# Patient Record
Sex: Female | Born: 1945 | Race: Black or African American | Hispanic: No | Marital: Married | Smoking: Never smoker
Health system: Southern US, Community
[De-identification: ages and names within clinical notes are randomized; demographics above are authoritative.]

## PROBLEM LIST (undated history)

## (undated) DIAGNOSIS — R413 Other amnesia: Secondary | ICD-10-CM

## (undated) DIAGNOSIS — E785 Hyperlipidemia, unspecified: Secondary | ICD-10-CM

## (undated) DIAGNOSIS — I10 Essential (primary) hypertension: Secondary | ICD-10-CM

## (undated) DIAGNOSIS — F411 Generalized anxiety disorder: Secondary | ICD-10-CM

## (undated) DIAGNOSIS — K219 Gastro-esophageal reflux disease without esophagitis: Secondary | ICD-10-CM

## (undated) DIAGNOSIS — Z91013 Allergy to seafood: Secondary | ICD-10-CM

## (undated) HISTORY — DX: Hyperlipidemia, unspecified: E78.5

## (undated) HISTORY — PX: TUBAL LIGATION: SHX77

## (undated) HISTORY — DX: Gastro-esophageal reflux disease without esophagitis: K21.9

## (undated) HISTORY — DX: Generalized anxiety disorder: F41.1

## (undated) HISTORY — DX: Essential (primary) hypertension: I10

## (undated) HISTORY — DX: Other amnesia: R41.3

## (undated) HISTORY — DX: Allergy to seafood: Z91.013

---

## 1998-06-29 ENCOUNTER — Inpatient Hospital Stay (HOSPITAL_COMMUNITY): Admission: EM | Admit: 1998-06-29 | Discharge: 1998-07-02 | Payer: Self-pay | Admitting: Emergency Medicine

## 1998-07-01 ENCOUNTER — Encounter: Payer: Self-pay | Admitting: Cardiology

## 1999-11-25 ENCOUNTER — Other Ambulatory Visit: Admission: RE | Admit: 1999-11-25 | Discharge: 1999-11-25 | Payer: Self-pay | Admitting: Obstetrics and Gynecology

## 2000-04-03 ENCOUNTER — Ambulatory Visit (HOSPITAL_COMMUNITY): Admission: RE | Admit: 2000-04-03 | Discharge: 2000-04-03 | Payer: Self-pay | Admitting: Gastroenterology

## 2001-07-24 ENCOUNTER — Other Ambulatory Visit: Admission: RE | Admit: 2001-07-24 | Discharge: 2001-07-24 | Payer: Self-pay | Admitting: Obstetrics and Gynecology

## 2001-12-10 ENCOUNTER — Encounter: Admission: RE | Admit: 2001-12-10 | Discharge: 2001-12-10 | Payer: Self-pay | Admitting: *Deleted

## 2004-01-25 ENCOUNTER — Other Ambulatory Visit: Admission: RE | Admit: 2004-01-25 | Discharge: 2004-01-25 | Payer: Self-pay | Admitting: Obstetrics and Gynecology

## 2005-04-13 ENCOUNTER — Other Ambulatory Visit: Admission: RE | Admit: 2005-04-13 | Discharge: 2005-04-13 | Payer: Self-pay | Admitting: Obstetrics and Gynecology

## 2005-05-24 ENCOUNTER — Ambulatory Visit (HOSPITAL_COMMUNITY): Admission: RE | Admit: 2005-05-24 | Discharge: 2005-05-24 | Payer: Self-pay | Admitting: Obstetrics and Gynecology

## 2006-01-15 ENCOUNTER — Emergency Department (HOSPITAL_COMMUNITY): Admission: EM | Admit: 2006-01-15 | Discharge: 2006-01-15 | Payer: Self-pay | Admitting: Emergency Medicine

## 2007-07-06 ENCOUNTER — Inpatient Hospital Stay (HOSPITAL_COMMUNITY): Admission: EM | Admit: 2007-07-06 | Discharge: 2007-07-07 | Payer: Self-pay | Admitting: Emergency Medicine

## 2007-07-06 ENCOUNTER — Encounter (INDEPENDENT_AMBULATORY_CARE_PROVIDER_SITE_OTHER): Payer: Self-pay | Admitting: Emergency Medicine

## 2008-01-15 ENCOUNTER — Encounter: Admission: RE | Admit: 2008-01-15 | Discharge: 2008-01-15 | Payer: Self-pay | Admitting: Gastroenterology

## 2008-02-17 ENCOUNTER — Emergency Department (HOSPITAL_COMMUNITY): Admission: EM | Admit: 2008-02-17 | Discharge: 2008-02-17 | Payer: Self-pay | Admitting: Emergency Medicine

## 2010-02-25 ENCOUNTER — Inpatient Hospital Stay (HOSPITAL_COMMUNITY): Admission: EM | Admit: 2010-02-25 | Discharge: 2010-02-27 | Payer: Self-pay | Admitting: Emergency Medicine

## 2010-02-26 ENCOUNTER — Encounter (INDEPENDENT_AMBULATORY_CARE_PROVIDER_SITE_OTHER): Payer: Self-pay | Admitting: Internal Medicine

## 2010-03-17 ENCOUNTER — Ambulatory Visit (HOSPITAL_COMMUNITY): Admission: RE | Admit: 2010-03-17 | Discharge: 2010-03-17 | Payer: Self-pay | Admitting: Gastroenterology

## 2010-11-28 ENCOUNTER — Encounter: Payer: Self-pay | Admitting: Gastroenterology

## 2010-12-13 ENCOUNTER — Other Ambulatory Visit: Payer: Self-pay | Admitting: Obstetrics and Gynecology

## 2010-12-13 DIAGNOSIS — R928 Other abnormal and inconclusive findings on diagnostic imaging of breast: Secondary | ICD-10-CM

## 2010-12-15 ENCOUNTER — Ambulatory Visit
Admission: RE | Admit: 2010-12-15 | Discharge: 2010-12-15 | Disposition: A | Discharge status: Home / Self Care | Payer: BC Managed Care – PPO | Source: Ambulatory Visit | Attending: Obstetrics and Gynecology | Admitting: Obstetrics and Gynecology

## 2010-12-15 ENCOUNTER — Ambulatory Visit
Admission: RE | Admit: 2010-12-15 | Discharge: 2010-12-15 | Disposition: A | Discharge status: Home / Self Care | Payer: Medicare Other | Source: Ambulatory Visit | Attending: Obstetrics and Gynecology | Admitting: Obstetrics and Gynecology

## 2010-12-15 DIAGNOSIS — R928 Other abnormal and inconclusive findings on diagnostic imaging of breast: Secondary | ICD-10-CM

## 2010-12-28 ENCOUNTER — Ambulatory Visit: Payer: Self-pay | Admitting: Internal Medicine

## 2011-01-11 ENCOUNTER — Ambulatory Visit: Payer: BC Managed Care – PPO | Admitting: Internal Medicine

## 2011-01-17 ENCOUNTER — Ambulatory Visit (INDEPENDENT_AMBULATORY_CARE_PROVIDER_SITE_OTHER): Payer: Medicare Other | Admitting: Internal Medicine

## 2011-01-17 ENCOUNTER — Encounter: Payer: Self-pay | Admitting: Internal Medicine

## 2011-01-17 DIAGNOSIS — E785 Hyperlipidemia, unspecified: Secondary | ICD-10-CM | POA: Insufficient documentation

## 2011-01-17 DIAGNOSIS — R413 Other amnesia: Secondary | ICD-10-CM | POA: Insufficient documentation

## 2011-01-17 DIAGNOSIS — F411 Generalized anxiety disorder: Secondary | ICD-10-CM | POA: Insufficient documentation

## 2011-01-17 DIAGNOSIS — N6019 Diffuse cystic mastopathy of unspecified breast: Secondary | ICD-10-CM | POA: Insufficient documentation

## 2011-01-17 DIAGNOSIS — K219 Gastro-esophageal reflux disease without esophagitis: Secondary | ICD-10-CM

## 2011-01-17 DIAGNOSIS — Z23 Encounter for immunization: Secondary | ICD-10-CM

## 2011-01-17 DIAGNOSIS — Z91013 Allergy to seafood: Secondary | ICD-10-CM | POA: Insufficient documentation

## 2011-01-17 DIAGNOSIS — G3184 Mild cognitive impairment, so stated: Secondary | ICD-10-CM | POA: Insufficient documentation

## 2011-01-17 DIAGNOSIS — I10 Essential (primary) hypertension: Secondary | ICD-10-CM | POA: Insufficient documentation

## 2011-01-17 DIAGNOSIS — F419 Anxiety disorder, unspecified: Secondary | ICD-10-CM | POA: Insufficient documentation

## 2011-01-17 DIAGNOSIS — Z2911 Encounter for prophylactic immunotherapy for respiratory syncytial virus (RSV): Secondary | ICD-10-CM

## 2011-01-24 LAB — URINE MICROSCOPIC-ADD ON

## 2011-01-24 LAB — CARDIAC PANEL(CRET KIN+CKTOT+MB+TROPI)
CK, MB: 0.9 ng/mL (ref 0.3–4.0)
CK, MB: 0.9 ng/mL (ref 0.3–4.0)
CK, MB: 1.2 ng/mL (ref 0.3–4.0)
Relative Index: INVALID (ref 0.0–2.5)
Relative Index: INVALID (ref 0.0–2.5)
Relative Index: INVALID (ref 0.0–2.5)
Total CK: 75 U/L (ref 7–177)
Total CK: 76 U/L (ref 7–177)
Total CK: 91 U/L (ref 7–177)
Troponin I: 0.01 ng/mL (ref 0.00–0.06)
Troponin I: 0.01 ng/mL (ref 0.00–0.06)
Troponin I: 0.01 ng/mL (ref 0.00–0.06)

## 2011-01-24 LAB — CBC
HCT: 30.1 % — ABNORMAL LOW (ref 36.0–46.0)
HCT: 35 % — ABNORMAL LOW (ref 36.0–46.0)
Hemoglobin: 11.5 g/dL — ABNORMAL LOW (ref 12.0–15.0)
Hemoglobin: 9.9 g/dL — ABNORMAL LOW (ref 12.0–15.0)
MCHC: 32.8 g/dL (ref 30.0–36.0)
MCHC: 32.9 g/dL (ref 30.0–36.0)
MCV: 87.6 fL (ref 78.0–100.0)
MCV: 88 fL (ref 78.0–100.0)
Platelets: 215 10*3/uL (ref 150–400)
Platelets: 235 10*3/uL (ref 150–400)
RBC: 3.42 MIL/uL — ABNORMAL LOW (ref 3.87–5.11)
RBC: 4 MIL/uL (ref 3.87–5.11)
RDW: 12.6 % (ref 11.5–15.5)
RDW: 12.9 % (ref 11.5–15.5)
WBC: 6.5 10*3/uL (ref 4.0–10.5)
WBC: 7.2 10*3/uL (ref 4.0–10.5)

## 2011-01-24 LAB — COMPREHENSIVE METABOLIC PANEL
ALT: 14 U/L (ref 0–35)
ALT: 14 U/L (ref 0–35)
AST: 18 U/L (ref 0–37)
AST: 21 U/L (ref 0–37)
Albumin: 3.2 g/dL — ABNORMAL LOW (ref 3.5–5.2)
Albumin: 3.6 g/dL (ref 3.5–5.2)
Alkaline Phosphatase: 102 U/L (ref 39–117)
Alkaline Phosphatase: 94 U/L (ref 39–117)
BUN: 10 mg/dL (ref 6–23)
BUN: 9 mg/dL (ref 6–23)
CO2: 24 mEq/L (ref 19–32)
CO2: 28 mEq/L (ref 19–32)
Calcium: 8.7 mg/dL (ref 8.4–10.5)
Calcium: 9.1 mg/dL (ref 8.4–10.5)
Chloride: 111 mEq/L (ref 96–112)
Chloride: 111 mEq/L (ref 96–112)
Creatinine, Ser: 0.71 mg/dL (ref 0.4–1.2)
Creatinine, Ser: 0.76 mg/dL (ref 0.4–1.2)
GFR calc Af Amer: >60 mL/min (ref >60)
GFR calc Af Amer: >60 mL/min (ref >60)
GFR calc non Af Amer: >60 mL/min (ref >60)
GFR calc non Af Amer: >60 mL/min (ref >60)
Glucose, Bld: 105 mg/dL — ABNORMAL HIGH (ref 70–99)
Glucose, Bld: 87 mg/dL (ref 70–99)
Potassium: 3.4 mEq/L — ABNORMAL LOW (ref 3.5–5.1)
Potassium: 3.5 mEq/L (ref 3.5–5.1)
Sodium: 141 mEq/L (ref 135–145)
Sodium: 142 mEq/L (ref 135–145)
Total Bilirubin: 0.3 mg/dL (ref 0.3–1.2)
Total Bilirubin: 0.4 mg/dL (ref 0.3–1.2)
Total Protein: 6 g/dL (ref 6.0–8.3)
Total Protein: 6.6 g/dL (ref 6.0–8.3)

## 2011-01-24 LAB — DIFFERENTIAL
Basophils Absolute: 0 10*3/uL (ref 0.0–0.1)
Basophils Absolute: 0 10*3/uL (ref 0.0–0.1)
Basophils Relative: 0 % (ref 0–1)
Basophils Relative: 0 % (ref 0–1)
Eosinophils Absolute: 0 10*3/uL (ref 0.0–0.7)
Eosinophils Absolute: 0.1 10*3/uL (ref 0.0–0.7)
Eosinophils Relative: 1 % (ref 0–5)
Eosinophils Relative: 1 % (ref 0–5)
Lymphocytes Relative: 19 % (ref 12–46)
Lymphocytes Relative: 30 % (ref 12–46)
Lymphs Abs: 1.3 10*3/uL (ref 0.7–4.0)
Lymphs Abs: 2 10*3/uL (ref 0.7–4.0)
Monocytes Absolute: 0.3 10*3/uL (ref 0.1–1.0)
Monocytes Absolute: 0.4 10*3/uL (ref 0.1–1.0)
Monocytes Relative: 5 % (ref 3–12)
Monocytes Relative: 6 % (ref 3–12)
Neutro Abs: 4 10*3/uL (ref 1.7–7.7)
Neutro Abs: 5.5 10*3/uL (ref 1.7–7.7)
Neutrophils Relative %: 62 % (ref 43–77)
Neutrophils Relative %: 76 % (ref 43–77)

## 2011-01-24 LAB — URINALYSIS, ROUTINE W REFLEX MICROSCOPIC
Bilirubin Urine: NEGATIVE
Glucose, UA: NEGATIVE mg/dL
Hgb urine dipstick: NEGATIVE
Ketones, ur: NEGATIVE mg/dL
Nitrite: NEGATIVE
Protein, ur: NEGATIVE mg/dL
Specific Gravity, Urine: 1.02 (ref 1.005–1.030)
Urobilinogen, UA: 1 mg/dL (ref 0.0–1.0)
pH: 6 (ref 5.0–8.0)

## 2011-01-24 LAB — POCT CARDIAC MARKERS
CKMB, poc: <1 ng/mL — ABNORMAL LOW (ref 1.0–8.0)
Myoglobin, poc: 63.1 ng/mL (ref 12–200)
Troponin i, poc: <0.05 ng/mL (ref 0.00–0.09)

## 2011-01-24 LAB — LIPASE, BLOOD: Lipase: 36 U/L (ref 11–59)

## 2011-01-24 NOTE — Assessment & Plan Note (Signed)
Summary: new,bcbs,mcr   Vital Signs:  Patient profile:   65 year old female Height:      67 inches Weight:      186 pounds BMI:     29.24 Temp:     98.4 degrees F oral Pulse rate:   76 / minute Pulse rhythm:   regular Resp:     16 per minute BP sitting:   120 / 80  (left arm) Cuff size:   regular  Vitals Entered By: Lanier Prude, Beverly Gust) (January 17, 2011 10:36 AM) CC: Est PCP Is Patient Diabetic? No   CC:  Est PCP.  History of Present Illness: New pt - well exam. C/o GERD x long time treated with Dexilant; cognitive impairment since 04/04/01 on Galantamine; anxiety, FMS with chronic pains relieved with meds  Preventive Screening-Counseling & Management  Alcohol-Tobacco     Smoking Status: never  Caffeine-Diet-Exercise     Does Patient Exercise: yes  Current Medications (verified): 1)  Dexilant 60 Mg Cpdr (Dexlansoprazole) .Marland Kitchen.. 1 By Mouth Once Daily 2)  Galantamine Hydrobromide 12 Mg Tabs (Galantamine Hydrobromide) .Marland Kitchen.. 1 By Mouth Two Times A Day 3)  Alprazolam 0.5 Mg Tabs (Alprazolam) .Marland Kitchen.. 1 Po Once Daily As Needed 4)  Tramadol Hcl 50 Mg Tabs (Tramadol Hcl) .Marland Kitchen.. 1 By Mouth Once Daily As Needed  Allergies (verified): 1)  * Shellfish  Past History:  Past Medical History: GERD Dr [address] she had EGD and Colon Gyn Dr Arelia Sneddon FMS Anxiety Hyperlipidemia Hypertension Memory loss after her son died in Apr 04, 2001  Past Surgical History: Denies surgical history  Family History: Family History Breast cancer 1st degree relative <50 Family History of CAD Female 1st degree relative <50 Family History of Cervical cancer Family History Ovarian cancer 7 sisters and 6 brothers  Social History: Occupation: she was a Technical sales engineer Retired Married Never Smoked Alcohol use-no Regular exercise-yes Smoking Status:  never Does Patient Exercise:  yes  Review of Systems       The patient complains of depression.  The patient denies anorexia, fever, weight loss, weight gain,  vision loss, decreased hearing, hoarseness, chest pain, syncope, dyspnea on exertion, peripheral edema, prolonged cough, headaches, hemoptysis, abdominal pain, melena, hematochezia, severe indigestion/heartburn, hematuria, incontinence, genital sores, muscle weakness, suspicious skin lesions, transient blindness, difficulty walking, unusual weight change, abnormal bleeding, enlarged lymph nodes, angioedema, and breast masses.    Physical Exam  General:  Well-developed,well-nourished,in no acute distress; alert,appropriate and cooperative throughout examination Head:  Normocephalic and atraumatic without obvious abnormalities. No apparent alopecia or balding. Eyes:  No corneal or conjunctival inflammation noted. EOMI. Perrla.  Ears:  External ear exam shows no significant lesions or deformities.  Otoscopic examination reveals clear canals, tympanic membranes are intact bilaterally without bulging, retraction, inflammation or discharge. Hearing is grossly normal bilaterally. Nose:  External nasal examination shows no deformity or inflammation. Nasal mucosa are pink and moist without lesions or exudates. Mouth:  Oral mucosa and oropharynx without lesions or exudates.  Teeth in good repair. Lungs:  Normal respiratory effort, chest expands symmetrically. Lungs are clear to auscultation, no crackles or wheezes. Heart:  Normal rate and regular rhythm. S1 and S2 normal without gallop, murmur, click, rub or other extra sounds. Abdomen:  Bowel sounds positive,abdomen soft and non-tender without masses, organomegaly or hernias noted. Msk:  No deformity or scoliosis noted of thoracic or lumbar spine.   Extremities:  No clubbing, cyanosis, edema, or deformity noted with normal full range of motion of all joints.  Neurologic:  No cranial nerve deficits noted. Station and gait are normal. Plantar reflexes are down-going bilaterally. DTRs are symmetrical throughout. Sensory, motor and coordinative functions appear  intact. Skin:  Intact without suspicious lesions or rashes Cervical Nodes:  No lymphadenopathy noted Psych:  Cognition and judgment appear intact. Alert and cooperative with normal attention span and concentration. No apparent delusions, illusions, hallucinations   Impression & Recommendations:  Problem # 1:  HEALTH MAINTENANCE EXAM (ICD-V70.0) Assessment New Health and age related issues were discussed. Available screening tests and vaccinations were discussed as well. Healthy life style including good diet and exercise was discussed.  Labs ordered  Problem # 2:  FIBROCYSTIC BREAST DISEASE (ICD-610.1) Assessment: Unchanged  Problem # 3:  ANXIETY (ICD-300.00) Assessment: Unchanged  Her updated medication list for this problem includes:    Alprazolam 0.5 Mg Tabs (Alprazolam) .Marland Kitchen... 1 po once daily as needed  Problem # 4:  GERD (ICD-530.81) Assessment: Unchanged  Her updated medication list for this problem includes:    Dexilant 60 Mg Cpdr (Dexlansoprazole) .Marland Kitchen... 1 by mouth once daily  Problem # 5:  HYPERTENSION (ICD-401.9)  Complete Medication List: 1)  Dexilant 60 Mg Cpdr (Dexlansoprazole) .Marland Kitchen.. 1 by mouth once daily 2)  Galantamine Hydrobromide 12 Mg Tabs (Galantamine hydrobromide) .Marland Kitchen.. 1 by mouth two times a day 3)  Alprazolam 0.5 Mg Tabs (Alprazolam) .Marland Kitchen.. 1 po once daily as needed 4)  Tramadol Hcl 50 Mg Tabs (Tramadol hcl) .Marland Kitchen.. 1 by mouth once daily as needed 5)  Ibuprofen 400 Mg Tabs (Ibuprofen) .Marland Kitchen.. 1 by mouth two times a day pc prn 6)  Vitamin D 1000 Unit Tabs (Cholecalciferol) .Marland Kitchen.. 1 by mouth qd 7)  Sudafed 30 Mg Tabs (Pseudoephedrine hcl) .... 2 by mouth qid prn 8)  Benadryl 25 Mg Tabs (Diphenhydramine hcl) .... 2 by mouth qid as needed reaction  Other Orders: Zoster (Shingles) Vaccine Live [phone]) Admin 1st Vaccine (60454)  Patient Instructions: 1)  Please schedule a follow-up appointment in 3 months. 2)  BMP prior to visit, ICD-9: v70.0 3)  Hepatic Panel prior  to visit, ICD-9: 4)  Lipid Panel prior to visit, ICD-9: 5)  TSH prior to visit, ICD-9: 6)  CBC w/ Diff prior to visit, ICD-9: 7)  Urine-dip prior to visit, ICD-9: 8)  Vit B12 782.0 9)  Vit D 733.90 Prescriptions: TRAMADOL HCL 50 MG TABS (TRAMADOL HCL) 1 by mouth once daily as needed  #90 x 3   Entered and Authorized by:   Tresa Garter MD   Signed by:   Tresa Garter MD on 01/17/2011   Method used:   Print then Give to Patient   RxID:   [id number] ALPRAZOLAM 0.5 MG TABS (ALPRAZOLAM) 1 po once daily as needed  #90 x 2   Entered and Authorized by:   Tresa Garter MD   Signed by:   Tresa Garter MD on 01/17/2011   Method used:   Print then Give to Patient   RxID:   [id number] GALANTAMINE HYDROBROMIDE 12 MG TABS (GALANTAMINE HYDROBROMIDE) 1 by mouth two times a day  #180 x 3   Entered and Authorized by:   Tresa Garter MD   Signed by:   Tresa Garter MD on 01/17/2011   Method used:   Print then Give to Patient   RxID:   [id number] DEXILANT 60 MG CPDR (DEXLANSOPRAZOLE) 1 by mouth once daily  #90 x 3   Entered and Authorized by:   Georgina Quint Gianlucca Szymborski  MD   Signed by:   Tresa Garter MD on 01/17/2011   Method used:   Print then Give to Patient   RxID:   [id number] BENADRYL 25 MG TABS (DIPHENHYDRAMINE HCL) 2 by mouth qid as needed reaction  #60 x 3   Entered and Authorized by:   Tresa Garter MD   Signed by:   Tresa Garter MD on 01/17/2011   Method used:   Print then Give to Patient   RxID:   [id number] SUDAFED 30 MG TABS (PSEUDOEPHEDRINE HCL) 2 by mouth qid prn  #60 x 3   Entered and Authorized by:   Tresa Garter MD   Signed by:   Tresa Garter MD on 01/17/2011   Method used:   Print then Give to Patient   RxID:   [id number] IBUPROFEN 400 MG TABS (IBUPROFEN) 1 by mouth two times a day pc prn  #60 x 3   Entered and Authorized by:   Tresa Garter MD   Signed by:   Tresa Garter MD on 01/17/2011   Method used:   Print then Give to Patient   RxID:   [id number]    Orders Added: 1)  Zoster (Shingles) Vaccine Live [[id number]] 2)  Admin 1st Vaccine [90471] 3)  New Patient 65&> [63875]   Immunizations Administered:  Zostavax # 1:    Vaccine Type: Zostavax    Site: left deltoid    Mfr: Merck    Dose: 0.65 ml    Route: [place]    Given by: Lanier Prude, CMA(AAMA)    Exp. Date: 06/24/2011    Lot #: [id number]    VIS given: 08/18/05 given January 17, 2011.   Immunizations Administered:  Zostavax # 1:    Vaccine Type: Zostavax    Site: left deltoid    Mfr: Merck    Dose: 0.65 ml    Route: [place]    Given by: Lanier Prude, CMA(AAMA)    Exp. Date: 06/24/2011    Lot #: [id number]    VIS given: 08/18/05 given January 17, 2011.

## 2011-03-21 NOTE — H&P (Signed)
NAME:  Shelley Bray, Shelley Bray                ACCOUNT NO.:  [id number]   MEDICAL RECORD NO.:  [id number]          PATIENT TYPE:  INP   LOCATION:  1826                         FACILITY:  [facility]   PHYSICIAN:  Hind I Elsaid, MD      DATE OF BIRTH:  1946/06/19   DATE OF ADMISSION:  07/06/2007  DATE OF DISCHARGE:                              HISTORY & PHYSICAL   PRIMARY CARE PHYSICIAN:  Dr. Mignon Pine.   HISTORY OF PRESENT ILLNESS:  A 65 year old female with a history of  fibromyalgia, hypercholesterolemia, recently diagnosed with  hypertension, where the patient placed on Cardura and Zocor for one  month.  The patient admitted to the hospital with a chief complaint of  one-day history of abdominal pain, which mainly around umbilicus, which  was a sudden onset and gradually built up,  severity was 10/10.  Pain  was not radiated.  Pain was constant.  Pain continued all day long.  The  patient tried to take one of her fibromyalgia medications to relieve her  abdominal pain, but there is no significant resolution of her abdominal  pain.  The patient continued to do her daily activities despite the  abdominal pain.  The patient denies any change of bowel habits.  The  patient admitted she did not eat anything that day.  She has to go to  some family meeting, and she was there she did not even finish 30  minutes from her visit.  As she was sitting, she suddenly became  sweating, and also she was pale, and then according to her history, she  passed out for one minute recognized by the family, and they had to call  the ambulance to evaluate her.  After that, the patient continued to  complain of left sided chest pain, radiated to her left arm.  Pain was  tightness in nature, but radiated to both shoulders associated with some  shortness of breath.  The patient denies any relieving or aggravating  factor.  Is not related to deep breathing.  The patient was seen in the  ED, still complaining of left sided chest  pain she rated around 5/10.  .  The patient denies any burning micturition.  Denies any change in bowel  habit.  The patient admitted she has a result of nausea after the  syncopal episode, and dry heaves.  Denies any vomiting.  The patient  denies any diarrhea.  Denies any witnessed seizure.  Denies any  headache.  Denies any numbness or weakness of her extremities.  Denies  any shortness of breath at this time.  Denies any back pain.   PAST MEDICAL HISTORY:  1. Anxiety disorder.  2. Fibromyalgia.  3. Hypercholesterolemia.  4. Hypertension.  5. Mild cognitive impairment.   MEDICATIONS:  Reminil dose unknown,  Mobic, Xanax, Cardura current dose  unknown.   ALLERGIES:  No known drug allergies.   PAST SURGICAL HISTORY:  No significant past surgical history.   FAMILY HISTORY:  Mother died at age more than 100.  According to her,  she has many sisters and brothers, most of  them with cancer at age 37 to  88, include pancreatic cancer, breast cancer, and brain cancer.   SOCIAL HISTORY:  She lives with her husband, has two children.  One of  them, a boy who died at age 33 after developing renal insufficiency.  Daughter is still alive, and she is married and in good health.  The  patient denies any smoking history.  Occasional alcohol abuse, and  denies any IV drug use.   PHYSICAL EXAMINATION:  VITAL SIGNS:  Temperature was 97.4, blood  pressure 123/82, pulse rate 69, and saturating 98% on room air.  GENERAL:  The patient lying comfortably in bed, not in respiratory  distress or shortness of breath.  During conversation, the patient had  some abdominal pain, mainly around umbilicus, and some chest tightness.  HEENT:  Normocephalic, atraumatic.  No jaundice.  Pupils equal, reactive  to light and accommodation.  NECK:  Neck supple.  No JVD, and I could not appreciate any carotid  bruit.  LUNGS:  Normal work of breathing with equal air entry.  HEART:  S1 and S2; no other sounds.   ABDOMEN:  Soft.  Mild tenderness around the right area fossa.  Bowel  sounds positive.  I did not appreciate any organomegaly.  LOWER EXTREMITIES:  There is no evidence of edema.  Peripheral pulses  intact, and no cyanosis.  CNS:  Patient alert and oriented x3 with no focal neurological findings.   LABORATORY DATA:  Sodium 141, potassium 3.8, chloride 108, BUN 16,  glucose 83, bicarb 25.7.  White blood cells 8.2, hemoglobin 11.3,  hematocrit 33.2, and platelets 246. EKG:  Normal sinus rhythm.  Cardiac  enzymes were negative, and D-dimer was 0.23.   ASSESSMENT AND PLAN:  1. Syncope:  This looks vasovagal syncope versus neurocardiogenic      secondary to the severity of abdominal pain.  We will admit the      patient for telemetry to evaluate for any cardiac evidence.  We      will get cardiac enzymes and 2-D echocardiogram, and we will start      the patient on aspirin.  Further recommendations to be addressed      during hospitalization.  Unlikely the patient has any evidence of      seizure or stroke, and I do not think no need for any MRI or EEG at      this time.  2. Chest pain:  Rule out myocardial infarction.  The patient still has      chest pain.  We will admit the patient, get lipid profile and      cardiac enzymes, and further recommendations for severity.  We will      consider cardiology consult if the patient continues complaining of      chest pain.  3. Abdominal pain:  Cause is really unclear.  The patient has normal      abdominal examination, and bowel sounds were positive.  We will go      ahead with CT abdomen and pelvis with p.o. and IV contrast.  We      will get amylase level, and we will get liver function test, and we      will continue with pain medication at this time.  Further      recommendations to be addressed after above report.  Deep venous      thrombosis and gastrointestinal prophylaxis.      Hind I Eda Paschal, MD  Electronically Signed  HIE/MEDQ  D:  07/06/2007  T:  07/07/2007  Job:  [id number]

## 2011-03-24 NOTE — Op Note (Signed)
[place] [place]  Patient:    Shelley Bray, Shelley Bray                       MRN: [id number] Proc. Date: 04/03/00 Adm. Date:  [id number] Attending:  Orland Mustard CC:         Juluis Mire, M.D.                           Operative Report  PROCEDURE:  Colonoscopy.  MEDICATIONS:  Fentanyl 75 mcg, Versed 10 mg IV.  SCOPE:  Olympus adult video colonoscope.  INDICATIONS:  Rectal bleeding in a 65 year old.  DESCRIPTION OF PROCEDURE:  The procedure had been explained to the patient and consent obtained.  With the patient in the left lateral decubitus position, the  Olympus adult video colonoscope was inserted and advanced under direct visualization.  Prep quite good.  We were able to advance to the cecum without difficulty.  The patient had melanosis coli throughout.  The ileocecal valve and right lower quadrant were seen.  The scope was withdrawn and the cecum, ascending colon, hepatic flexure, transverse colon, splenic flexure, descending and sigmoid colon were seen well upon removal and no polyps seen.  Marked diverticulosis seen in the left colon.  We were able to pass this area fairly easily.  Internal hemorrhoids were seen in the rectum and presumably the source of the patients bleeding.  The scope was withdrawn and the patient tolerated the procedure well. She was maintained on low flow oxygen and pulse oximetry throughout the procedure with no obvious problems.  ASSESSMENT: 1. Rectal bleeding probably due to internal hemorrhoids. 2. Diverticulosis.  PLAN:  Will recommend yearly hemoccults and a sigmoidoscopy in five years.  If er hemoccults are positive, I would go ahead with another colonoscopy. DD:  04/03/00 TD:  04/04/00 Job: [id number]

## 2011-03-24 NOTE — Discharge Summary (Signed)
NAMEANAEL, ROSCH                ACCOUNT NO.:  [id number]   MEDICAL RECORD NO.:  [id number]          PATIENT TYPE:  INP   LOCATION:  4713                         FACILITY:  [facility]   PHYSICIAN:  Lonia Blood, M.D.       DATE OF BIRTH:  [birth year]   DATE OF ADMISSION:  07/05/2007  DATE OF DISCHARGE:  07/07/2007                               DISCHARGE SUMMARY   PRIMARY CARE PHYSICIAN:  Olene Craven, M.D.   DISCHARGE DIAGNOSES:  1. Syncope, probable vasovagal reaction, no recurrence.  2. Abdominal pain of unclear etiology.  CT scan of abdomen within      normal limits.  3. Chest pain, unclear etiology.  Three sets of cardiac enzymes were      within normal limits.  4. Hypertension.  5. Hyperlipidemia.  6. Urinary tract infection.  7. Mild anemia.  8. Fibromyalgia.  9. Anxiety disorder.   DISCHARGE MEDICATIONS:  1. Reminyl as previously.  2. Xanax 1 mg three times a day as needed.  3. Zocor 40 mg daily.  4. Cipro 500 mg twice a day for 3 days.  5. Prilosec OTC 20 mg daily.   CONDITION ON DISCHARGE:  The patient was discharged in good condition to  home and she was told to follow up with her primary care physician as  needed.   PROCEDURES PERFORMED:  On July 06, 2007, the patient underwent CT scan  of abdomen and pelvis.  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  No acute intra-abdominal or pelvic  pathology.   CONSULTATION:  No consultations were obtained.   HISTORY AND PHYSICAL:  For admission history and physical, refer to the  dictated H and P done by Dr. Eda Paschal on July 06, 2007.   HOSPITAL COURSE:  1. Abdominal pain.  Ms. Osgood was admitted and she was fully evaluated      with CT scan of abdomen and pelvis.  Findings of the CT scan were      within normal limits.  The patient's abdominal pain has improved      significantly and by July 07, 2007, she was without any pain.  It      was felt that the patient has had the  episode of syncope most      likely related to the abdominal pain and it was probably a      vasovagal event.  The patient did not have any alarms on telemetry      and 3 sets of cardiac enzymes were within normal limits.  The      patient's orthostatics were      within normal limits also.  2. Urinary tract infection.  The patient's culture was positive for      multiple pathogens.  We have treated her empirically with      ciprofloxacin with good response.      Lonia Blood, M.D.  Electronically Signed     SL/MEDQ  D:  08/01/2007  T:  08/02/2007  Job:  [id number]   cc:   Olene Craven, M.D.

## 2011-04-18 ENCOUNTER — Other Ambulatory Visit: Payer: Self-pay | Admitting: Internal Medicine

## 2011-04-18 ENCOUNTER — Other Ambulatory Visit: Payer: BC Managed Care – PPO

## 2011-04-18 DIAGNOSIS — M899 Disorder of bone, unspecified: Secondary | ICD-10-CM

## 2011-04-18 DIAGNOSIS — R209 Unspecified disturbances of skin sensation: Secondary | ICD-10-CM

## 2011-04-18 DIAGNOSIS — Z0000 Encounter for general adult medical examination without abnormal findings: Secondary | ICD-10-CM

## 2011-04-21 ENCOUNTER — Encounter: Payer: Self-pay | Admitting: Internal Medicine

## 2011-04-25 ENCOUNTER — Ambulatory Visit (INDEPENDENT_AMBULATORY_CARE_PROVIDER_SITE_OTHER): Payer: Medicare Other | Admitting: Internal Medicine

## 2011-04-25 ENCOUNTER — Other Ambulatory Visit (INDEPENDENT_AMBULATORY_CARE_PROVIDER_SITE_OTHER): Payer: Medicare Other

## 2011-04-25 ENCOUNTER — Other Ambulatory Visit: Payer: Self-pay | Admitting: Internal Medicine

## 2011-04-25 ENCOUNTER — Encounter: Payer: Self-pay | Admitting: Internal Medicine

## 2011-04-25 DIAGNOSIS — F988 Other specified behavioral and emotional disorders with onset usually occurring in childhood and adolescence: Secondary | ICD-10-CM | POA: Insufficient documentation

## 2011-04-25 DIAGNOSIS — R5383 Other fatigue: Secondary | ICD-10-CM

## 2011-04-25 DIAGNOSIS — G3184 Mild cognitive impairment, so stated: Secondary | ICD-10-CM

## 2011-04-25 DIAGNOSIS — R209 Unspecified disturbances of skin sensation: Secondary | ICD-10-CM

## 2011-04-25 DIAGNOSIS — Z0000 Encounter for general adult medical examination without abnormal findings: Secondary | ICD-10-CM

## 2011-04-25 DIAGNOSIS — I10 Essential (primary) hypertension: Secondary | ICD-10-CM

## 2011-04-25 DIAGNOSIS — R5381 Other malaise: Secondary | ICD-10-CM

## 2011-04-25 DIAGNOSIS — R5382 Chronic fatigue, unspecified: Secondary | ICD-10-CM | POA: Insufficient documentation

## 2011-04-25 DIAGNOSIS — M949 Disorder of cartilage, unspecified: Secondary | ICD-10-CM

## 2011-04-25 DIAGNOSIS — M899 Disorder of bone, unspecified: Secondary | ICD-10-CM

## 2011-04-25 LAB — URINALYSIS
Bilirubin Urine: NEGATIVE
Hgb urine dipstick: NEGATIVE
Ketones, ur: NEGATIVE
Leukocytes, UA: NEGATIVE
Nitrite: NEGATIVE
Specific Gravity, Urine: 1.025 (ref 1.000–1.030)
Total Protein, Urine: NEGATIVE
Urine Glucose: NEGATIVE
Urobilinogen, UA: 0.2 (ref 0.0–1.0)
pH: 5.5 (ref 5.0–8.0)

## 2011-04-25 LAB — BASIC METABOLIC PANEL
BUN: 12 mg/dL (ref 6–23)
CO2: 22 mEq/L (ref 19–32)
Calcium: 9.3 mg/dL (ref 8.4–10.5)
Chloride: 111 mEq/L (ref 96–112)
Creatinine, Ser: 0.6 mg/dL (ref 0.4–1.2)
GFR: 138.99 mL/min (ref >60.00)
Glucose, Bld: 91 mg/dL (ref 70–99)
Potassium: 4.8 mEq/L (ref 3.5–5.1)
Sodium: 143 mEq/L (ref 135–145)

## 2011-04-25 LAB — HEPATIC FUNCTION PANEL
ALT: 14 U/L (ref 0–35)
AST: 33 U/L (ref 0–37)
Albumin: 4 g/dL (ref 3.5–5.2)
Alkaline Phosphatase: 114 U/L (ref 39–117)
Bilirubin, Direct: 0.5 mg/dL — ABNORMAL HIGH (ref 0.0–0.3)
Total Bilirubin: 1.5 mg/dL — ABNORMAL HIGH (ref 0.3–1.2)
Total Protein: 6.9 g/dL (ref 6.0–8.3)

## 2011-04-25 LAB — CBC WITH DIFFERENTIAL/PLATELET
Basophils Absolute: 0 10*3/uL (ref 0.0–0.1)
Basophils Relative: 0.4 % (ref 0.0–3.0)
Eosinophils Absolute: 0 10*3/uL (ref 0.0–0.7)
Eosinophils Relative: 0.7 % (ref 0.0–5.0)
HCT: 34.4 % — ABNORMAL LOW (ref 36.0–46.0)
Hemoglobin: 11.6 g/dL — ABNORMAL LOW (ref 12.0–15.0)
Lymphocytes Relative: 27.1 % (ref 12.0–46.0)
Lymphs Abs: 1.6 10*3/uL (ref 0.7–4.0)
MCHC: 33.7 g/dL (ref 30.0–36.0)
MCV: 87.1 fl (ref 78.0–100.0)
Monocytes Absolute: 0.3 10*3/uL (ref 0.1–1.0)
Monocytes Relative: 4.4 % (ref 3.0–12.0)
Neutro Abs: 4 10*3/uL (ref 1.4–7.7)
Neutrophils Relative %: 67.4 % (ref 43.0–77.0)
Platelets: 234 10*3/uL (ref 150.0–400.0)
RBC: 3.96 Mil/uL (ref 3.87–5.11)
RDW: 13.5 % (ref 11.5–14.6)
WBC: 5.9 10*3/uL (ref 4.5–10.5)

## 2011-04-25 LAB — LIPID PANEL
Cholesterol: 237 mg/dL — ABNORMAL HIGH (ref 0–200)
HDL: 65.9 mg/dL (ref >39.00)
Total CHOL/HDL Ratio: 4
Triglycerides: 57 mg/dL (ref 0.0–149.0)
VLDL: 11.4 mg/dL (ref 0.0–40.0)

## 2011-04-25 LAB — TSH: TSH: 1.31 u[IU]/mL (ref 0.35–5.50)

## 2011-04-25 LAB — LDL CHOLESTEROL, DIRECT: Direct LDL: 151.7 mg/dL

## 2011-04-25 LAB — VITAMIN B12: Vitamin B-12: 602 pg/mL (ref 211–911)

## 2011-04-25 MED ORDER — METHYLPHENIDATE HCL 5 MG PO TABS: ORAL_TABLET | ORAL | Status: DC

## 2011-04-25 NOTE — Assessment & Plan Note (Signed)
Ritalin should help

## 2011-04-25 NOTE — Assessment & Plan Note (Signed)
Start Ritalin w/caution  Potential benefits of a long term amphetamine  use as well as potential risks  and complications were explained to the patient and were aknowledged.

## 2011-04-25 NOTE — Assessment & Plan Note (Signed)
Will try Ritalin

## 2011-04-25 NOTE — Progress Notes (Signed)
  Subjective:    Patient ID: Shelley Bray, female    DOB: Nov 09, 1945, 65 y.o.   MRN: [id number]  HPI   The patient is here to follow up on chronic cognitive impairment, depression, anxiety, headaches and chronic moderate fibromyalgia symptoms controlled with medicines, diet and exercise. Wt Readings from Last 3 Encounters:  04/25/11 197 lb (89.359 kg)  01/17/11 186 lb (84.369 kg)     Review of Systems  Constitutional: Positive for fatigue and unexpected weight change (wt gain). Negative for chills, activity change and appetite change.  HENT: Negative for congestion, mouth sores and sinus pressure.   Eyes: Negative for visual disturbance.  Respiratory: Negative for cough and chest tightness.   Gastrointestinal: Negative for nausea and abdominal pain.  Genitourinary: Negative for frequency, difficulty urinating and vaginal pain.  Musculoskeletal: Negative for back pain and gait problem.  Skin: Negative for pallor and rash.  Neurological: Negative for dizziness, tremors, weakness, numbness and headaches.  Psychiatric/Behavioral: Negative for suicidal ideas, confusion and sleep disturbance.       Forgetful ADD sx's Stressed       Objective:   Physical Exam  Constitutional: She appears well-developed and well-nourished. No distress.  HENT:  Head: Normocephalic.  Right Ear: External ear normal.  Left Ear: External ear normal.  Nose: Nose normal.  Mouth/Throat: Oropharynx is clear and moist.  Eyes: Conjunctivae are normal. Pupils are equal, round, and reactive to light. Right eye exhibits no discharge. Left eye exhibits no discharge.  Neck: Normal range of motion. Neck supple. No JVD present. No tracheal deviation present. No thyromegaly present.  Cardiovascular: Normal rate, regular rhythm and normal heart sounds.   Pulmonary/Chest: No stridor. No respiratory distress. She has no wheezes.  Abdominal: Soft. Bowel sounds are normal. She exhibits no distension and no mass. There is  no tenderness. There is no rebound and no guarding.  Musculoskeletal: She exhibits no edema and no tenderness.  Lymphadenopathy:    She has no cervical adenopathy.  Neurological: She displays normal reflexes. No cranial nerve deficit. She exhibits normal muscle tone. Coordination normal.  Skin: No rash noted. No erythema.  Psychiatric: She has a normal mood and affect. Her behavior is normal. Judgment and thought content normal.        Lab Results  Component Value Date   WBC 6.5 02/26/2010   HGB 9.9* 02/26/2010   HCT 30.1* 02/26/2010   PLT 215 02/26/2010   ALT 14 02/26/2010   AST 18 02/26/2010   NA 142 02/26/2010   K 3.5 02/26/2010   CL 111 02/26/2010   CREATININE 0.71 02/26/2010   BUN 10 02/26/2010   CO2 28 02/26/2010     Assessment & Plan:

## 2011-04-26 LAB — VITAMIN D 25 HYDROXY (VIT D DEFICIENCY, FRACTURES): Vit D, 25-Hydroxy: 36 ng/mL (ref 30–89)

## 2011-06-04 ENCOUNTER — Other Ambulatory Visit: Payer: Self-pay | Admitting: Internal Medicine

## 2011-07-11 ENCOUNTER — Encounter: Payer: Self-pay | Admitting: Internal Medicine

## 2011-07-11 ENCOUNTER — Ambulatory Visit (INDEPENDENT_AMBULATORY_CARE_PROVIDER_SITE_OTHER): Payer: Medicare Other | Admitting: Internal Medicine

## 2011-07-11 DIAGNOSIS — G3184 Mild cognitive impairment, so stated: Secondary | ICD-10-CM

## 2011-07-11 DIAGNOSIS — F988 Other specified behavioral and emotional disorders with onset usually occurring in childhood and adolescence: Secondary | ICD-10-CM

## 2011-07-11 DIAGNOSIS — R5383 Other fatigue: Secondary | ICD-10-CM

## 2011-07-11 DIAGNOSIS — E785 Hyperlipidemia, unspecified: Secondary | ICD-10-CM

## 2011-07-11 DIAGNOSIS — R5381 Other malaise: Secondary | ICD-10-CM

## 2011-07-11 DIAGNOSIS — K219 Gastro-esophageal reflux disease without esophagitis: Secondary | ICD-10-CM

## 2011-07-11 DIAGNOSIS — R5382 Chronic fatigue, unspecified: Secondary | ICD-10-CM

## 2011-07-11 DIAGNOSIS — I10 Essential (primary) hypertension: Secondary | ICD-10-CM

## 2011-07-11 MED ORDER — METHYLPHENIDATE HCL ER (LA) 10 MG PO CP24: 10.0000 mg | ORAL_CAPSULE | Freq: Every day | ORAL | Status: DC

## 2011-07-11 NOTE — Assessment & Plan Note (Signed)
Some better with Ritalin Will try an [place] form

## 2011-07-11 NOTE — Assessment & Plan Note (Signed)
Continue with current prescription therapy as reflected on the Med list.  

## 2011-07-11 NOTE — Assessment & Plan Note (Signed)
Statin intolerant 

## 2011-07-11 NOTE — Assessment & Plan Note (Signed)
Some better with Ritalin

## 2011-07-11 NOTE — Progress Notes (Signed)
  Subjective:    Patient ID: Shelley Bray, female    DOB: 08-23-1946, 65 y.o.   MRN: [id number]  HPI   The patient is here to follow up on chronic memory and focus issues, depression, anxiety, headaches and chronic grief reaction   Review of Systems  Constitutional: Negative for chills, activity change, appetite change, fatigue and unexpected weight change.  HENT: Negative for congestion, mouth sores and sinus pressure.   Eyes: Negative for visual disturbance.  Respiratory: Negative for cough and chest tightness.   Gastrointestinal: Negative for nausea and abdominal pain.  Genitourinary: Negative for frequency, difficulty urinating and vaginal pain.  Musculoskeletal: Negative for back pain and gait problem.  Skin: Negative for pallor and rash.  Neurological: Negative for dizziness, tremors, weakness, numbness and headaches.  Psychiatric/Behavioral: Positive for suicidal ideas. Negative for confusion and sleep disturbance. The patient is nervous/anxious.    Wt Readings from Last 3 Encounters:  07/11/11 196 lb (88.905 kg)  04/25/11 197 lb (89.359 kg)  01/17/11 186 lb (84.369 kg)       Objective:   Physical Exam  Constitutional: She appears well-developed and well-nourished. No distress.  HENT:  Head: Normocephalic.  Right Ear: External ear normal.  Left Ear: External ear normal.  Nose: Nose normal.  Mouth/Throat: Oropharynx is clear and moist.  Eyes: Conjunctivae are normal. Pupils are equal, round, and reactive to light. Right eye exhibits no discharge. Left eye exhibits no discharge.  Neck: Normal range of motion. Neck supple. No JVD present. No tracheal deviation present. No thyromegaly present.  Cardiovascular: Normal rate, regular rhythm and normal heart sounds.   Pulmonary/Chest: No stridor. No respiratory distress. She has no wheezes.  Abdominal: Soft. Bowel sounds are normal. She exhibits no distension and no mass. There is no tenderness. There is no rebound and no  guarding.  Musculoskeletal: She exhibits no edema and no tenderness.  Lymphadenopathy:    She has no cervical adenopathy.  Neurological: She displays normal reflexes. No cranial nerve deficit. She exhibits normal muscle tone. Coordination normal.  Skin: No rash noted. No erythema.  Psychiatric: She has a normal mood and affect. Her behavior is normal. Judgment and thought content normal.      Lab Results  Component Value Date   WBC 5.9 04/25/2011   HGB 11.6* 04/25/2011   HCT 34.4* 04/25/2011   PLT 234.0 04/25/2011   CHOL 237* 04/25/2011   TRIG 57.0 04/25/2011   HDL 65.90 04/25/2011   LDLDIRECT 151.7 04/25/2011   ALT 14 04/25/2011   AST 33 04/25/2011   NA 143 04/25/2011   K 4.8 04/25/2011   CL 111 04/25/2011   CREATININE 0.6 04/25/2011   BUN 12 04/25/2011   CO2 22 04/25/2011   TSH 1.31 04/25/2011       Assessment & Plan:

## 2011-07-11 NOTE — Assessment & Plan Note (Addendum)
Better on rx 

## 2011-07-28 ENCOUNTER — Telehealth: Payer: Self-pay | Admitting: *Deleted

## 2011-07-28 NOTE — Telephone Encounter (Signed)
Pt got stung by a bee last night and states her hand is swollen, itching and uncomfortable. Pt states the swelling is in her hand (n where she got stung) and its all the way down to her wrist. What do you advise for pt. She denies SOB, throat felling like its swelling and states she has had no other symptoms

## 2011-07-28 NOTE — Telephone Encounter (Signed)
Per Dr Macario Golds, Elevated hand, benadryl 1-2 tid prn , Advil cold bid OV tomorrow. Patient informed, [facility] and advised to go to ER with severe symptoms.

## 2011-07-28 NOTE — Telephone Encounter (Signed)
Agree. Thx 

## 2011-07-29 ENCOUNTER — Ambulatory Visit: Payer: Medicare Other | Admitting: Family Medicine

## 2011-07-29 ENCOUNTER — Telehealth: Payer: Self-pay

## 2011-07-29 NOTE — Telephone Encounter (Signed)
Attempted to call and check on pt; called pt's home and cell phone numbers. No answer.

## 2011-08-09 ENCOUNTER — Ambulatory Visit (INDEPENDENT_AMBULATORY_CARE_PROVIDER_SITE_OTHER): Payer: Medicare Other | Admitting: Internal Medicine

## 2011-08-09 ENCOUNTER — Other Ambulatory Visit: Payer: Self-pay | Admitting: *Deleted

## 2011-08-09 ENCOUNTER — Encounter: Payer: Self-pay | Admitting: Internal Medicine

## 2011-08-09 VITALS — BP 122/82 | HR 73 | Temp 98.6°F | Ht 67.0 in | Wt 192.4 lb

## 2011-08-09 DIAGNOSIS — R05 Cough: Secondary | ICD-10-CM

## 2011-08-09 DIAGNOSIS — R059 Cough, unspecified: Secondary | ICD-10-CM

## 2011-08-09 DIAGNOSIS — J45909 Unspecified asthma, uncomplicated: Secondary | ICD-10-CM

## 2011-08-09 MED ORDER — IBUPROFEN 400 MG PO TABS: 400.0000 mg | ORAL_TABLET | Freq: Two times a day (BID) | ORAL | Status: DC | PRN

## 2011-08-09 MED ORDER — ALBUTEROL 90 MCG/ACT IN AERS: 2.0000 | INHALATION_SPRAY | Freq: Four times a day (QID) | RESPIRATORY_TRACT | Status: DC | PRN

## 2011-08-09 MED ORDER — PREDNISONE (PAK) 10 MG PO TABS: 10.0000 mg | ORAL_TABLET | ORAL | Status: AC

## 2011-08-09 NOTE — Progress Notes (Signed)
  Subjective:    Patient ID: Shelley Bray, female    DOB: 1946/06/29, 65 y.o.   MRN: [id number]  HPI  complains of cough Describes as dry with wheeze Onset 3 weeks ago - wax and wane symptoms  exac by bee sting last week and initially URI symptoms at start OTC meds tried and ineffective Hx asthma - nonsmoker Denies fever or sputum, no headache or hemopytsis No sick contacts but cares for g-son  Past Medical History  Diagnosis Date  . ANXIETY   . FIBROCYSTIC BREAST DISEASE   . GERD   . HYPERLIPIDEMIA   . HYPERTENSION   . SHELLFISH ALLERGY   . Memory loss     after son died in  03/22/2001    Review of Systems  Respiratory: Positive for chest tightness and wheezing. Negative for shortness of breath.   Cardiovascular: Negative for chest pain, palpitations and leg swelling.       Objective:   Physical Exam BP 122/82  Pulse 73  Temp(Src) 98.6 F (37 C) (Oral)  Ht 5\' 7"  (1.702 m)  Wt 192 lb 6.4 oz (87.272 kg)  BMI 30.13 kg/m2  SpO2 95% Wt Readings from Last 3 Encounters:  08/09/11 192 lb 6.4 oz (87.272 kg)  07/11/11 196 lb (88.905 kg)  04/25/11 197 lb (89.359 kg)   Constitutional: She is well-developed and well-nourished. No distress.  Neck: Normal range of motion. Neck supple. No JVD present. No thyromegaly present.  Cardiovascular: Normal rate, regular rhythm and normal heart sounds.  No murmur heard. No BLE edema. Pulmonary/Chest: Effort normal and breath sounds  good. No respiratory distress. She has mild exp wheezes but no crackles.  Psychiatric: She has a normal mood and affect. Her behavior is normal. Judgment and thought content normal.        Assessment & Plan:  Cough - evidence of mild bronchospasm and remote hx asthma - suspect RAD following URI 3 weeks ago - tx with pred pak and Alb MDI - to call if worse or unimproved to consider abx prn

## 2011-08-09 NOTE — Patient Instructions (Signed)
It was good to see you today. Prednisone pak and Albuterol inhaler for cough as discussed - Your prescription(s) have been submitted to your pharmacy. Please take as directed and contact our office if you believe you are having problem(s) with the medication(s). If you develop worsening symptoms or fever, call and we can reconsider antibiotics, but it does not appear necessary to use antibiotics at this time.

## 2011-08-18 LAB — TROPONIN I
Troponin I: 0.01
Troponin I: 0.02
Troponin I: 0.02

## 2011-08-18 LAB — COMPREHENSIVE METABOLIC PANEL
ALT: 25
AST: 19
Albumin: 3.5
Alkaline Phosphatase: 100
BUN: 18
CO2: 26
Calcium: 9.2
Chloride: 107
Creatinine, Ser: 0.87
GFR calc Af Amer: >60
GFR calc non Af Amer: >60
Glucose, Bld: 98
Potassium: 4
Sodium: 139
Total Bilirubin: 0.6
Total Protein: 6.7

## 2011-08-18 LAB — I-STAT 8, (EC8 V) (CONVERTED LAB)
Acid-base deficit: 1
BUN: 16
Bicarbonate: 25.7 — ABNORMAL HIGH
Chloride: 108
Glucose, Bld: 83
HCT: 37
Hemoglobin: 12.6
Operator id: 272551
Potassium: 3.8
Sodium: 141
TCO2: 27
pCO2, Ven: 49.6
pH, Ven: 7.324 — ABNORMAL HIGH

## 2011-08-18 LAB — LIPID PANEL
Cholesterol: 142
HDL: 49
LDL Cholesterol: 80
Total CHOL/HDL Ratio: 2.9
Triglycerides: 64
VLDL: 13

## 2011-08-18 LAB — URINALYSIS, ROUTINE W REFLEX MICROSCOPIC
Bilirubin Urine: NEGATIVE
Glucose, UA: NEGATIVE
Hgb urine dipstick: NEGATIVE
Ketones, ur: NEGATIVE
Nitrite: NEGATIVE
Protein, ur: NEGATIVE
Specific Gravity, Urine: 1.028
Urobilinogen, UA: 0.2
pH: 5.5

## 2011-08-18 LAB — DIFFERENTIAL
Basophils Absolute: 0
Basophils Relative: 0
Eosinophils Absolute: 0
Eosinophils Relative: 0
Lymphocytes Relative: 20
Lymphs Abs: 1.7
Monocytes Absolute: 0.5
Monocytes Relative: 6
Neutro Abs: 6
Neutrophils Relative %: 73

## 2011-08-18 LAB — BASIC METABOLIC PANEL
BUN: 10
CO2: 27
Calcium: 9
Chloride: 111
Creatinine, Ser: 0.73
GFR calc Af Amer: >60
GFR calc non Af Amer: >60
Glucose, Bld: 90
Potassium: 4
Sodium: 141

## 2011-08-18 LAB — CBC
HCT: 31.5 — ABNORMAL LOW
HCT: 33.2 — ABNORMAL LOW
Hemoglobin: 10.8 — ABNORMAL LOW
Hemoglobin: 11.3 — ABNORMAL LOW
MCHC: 34.1
MCHC: 34.2
MCV: 84.2
MCV: 84.4
Platelets: 208
Platelets: 246
RBC: 3.74 — ABNORMAL LOW
RBC: 3.94
RDW: 12.9
RDW: 13.3
WBC: 5.6
WBC: 8.2

## 2011-08-18 LAB — URINE CULTURE: Colony Count: >=100000

## 2011-08-18 LAB — POCT CARDIAC MARKERS
CKMB, poc: <1 — ABNORMAL LOW
Myoglobin, poc: 70.5
Operator id: 272551
Troponin i, poc: <0.05

## 2011-08-18 LAB — URINE MICROSCOPIC-ADD ON

## 2011-08-18 LAB — CK TOTAL AND CKMB (NOT AT ARMC)
CK, MB: 1
CK, MB: 1
CK, MB: 1.3
Relative Index: INVALID
Relative Index: INVALID
Relative Index: INVALID
Total CK: 79
Total CK: 83
Total CK: 95

## 2011-08-18 LAB — POCT I-STAT CREATININE
Creatinine, Ser: 1
Operator id: 272551

## 2011-08-18 LAB — B-NATRIURETIC PEPTIDE (CONVERTED LAB): Pro B Natriuretic peptide (BNP): <30

## 2011-08-18 LAB — LIPASE, BLOOD: Lipase: 26

## 2011-08-18 LAB — PHOSPHORUS: Phosphorus: 4.5

## 2011-08-18 LAB — MAGNESIUM: Magnesium: 2.2

## 2011-08-18 LAB — D-DIMER, QUANTITATIVE (NOT AT ARMC): D-Dimer, Quant: 0.23

## 2011-08-18 LAB — TSH: TSH: 1.547

## 2011-08-18 LAB — AMYLASE: Amylase: 100

## 2011-08-28 ENCOUNTER — Emergency Department (HOSPITAL_COMMUNITY)
Admission: EM | Admit: 2011-08-28 | Discharge: 2011-08-28 | Disposition: A | Discharge status: Home / Self Care | Payer: Medicare Other | Attending: Emergency Medicine | Admitting: Emergency Medicine

## 2011-08-28 ENCOUNTER — Emergency Department (HOSPITAL_COMMUNITY): Payer: Medicare Other

## 2011-08-28 DIAGNOSIS — K219 Gastro-esophageal reflux disease without esophagitis: Secondary | ICD-10-CM | POA: Insufficient documentation

## 2011-08-28 DIAGNOSIS — R1110 Vomiting, unspecified: Secondary | ICD-10-CM | POA: Insufficient documentation

## 2011-08-28 DIAGNOSIS — I10 Essential (primary) hypertension: Secondary | ICD-10-CM | POA: Insufficient documentation

## 2011-08-28 DIAGNOSIS — R42 Dizziness and giddiness: Secondary | ICD-10-CM | POA: Insufficient documentation

## 2011-08-28 DIAGNOSIS — IMO0001 Reserved for inherently not codable concepts without codable children: Secondary | ICD-10-CM | POA: Insufficient documentation

## 2011-08-28 DIAGNOSIS — R197 Diarrhea, unspecified: Secondary | ICD-10-CM | POA: Insufficient documentation

## 2011-08-28 DIAGNOSIS — J40 Bronchitis, not specified as acute or chronic: Secondary | ICD-10-CM | POA: Insufficient documentation

## 2011-08-28 DIAGNOSIS — R0602 Shortness of breath: Secondary | ICD-10-CM | POA: Insufficient documentation

## 2011-08-28 DIAGNOSIS — Z79899 Other long term (current) drug therapy: Secondary | ICD-10-CM | POA: Insufficient documentation

## 2011-08-28 DIAGNOSIS — R079 Chest pain, unspecified: Secondary | ICD-10-CM | POA: Insufficient documentation

## 2011-08-28 DIAGNOSIS — R51 Headache: Secondary | ICD-10-CM | POA: Insufficient documentation

## 2011-08-28 DIAGNOSIS — F411 Generalized anxiety disorder: Secondary | ICD-10-CM | POA: Insufficient documentation

## 2011-08-28 LAB — TROPONIN I: Troponin I: <0.3 ng/mL (ref <0.30)

## 2011-08-28 LAB — CBC
HCT: 36.2 % (ref 36.0–46.0)
Hemoglobin: 12 g/dL (ref 12.0–15.0)
MCH: 28.2 pg (ref 26.0–34.0)
MCHC: 33.1 g/dL (ref 30.0–36.0)
MCV: 85 fL (ref 78.0–100.0)
Platelets: 228 10*3/uL (ref 150–400)
RBC: 4.26 MIL/uL (ref 3.87–5.11)
RDW: 13 % (ref 11.5–15.5)
WBC: 11.2 10*3/uL — ABNORMAL HIGH (ref 4.0–10.5)

## 2011-08-28 LAB — COMPREHENSIVE METABOLIC PANEL
ALT: 13 U/L (ref 0–35)
AST: 16 U/L (ref 0–37)
Albumin: 3.7 g/dL (ref 3.5–5.2)
Alkaline Phosphatase: 117 U/L (ref 39–117)
BUN: 11 mg/dL (ref 6–23)
CO2: 24 mEq/L (ref 19–32)
Calcium: 9.9 mg/dL (ref 8.4–10.5)
Chloride: 106 mEq/L (ref 96–112)
Creatinine, Ser: 0.63 mg/dL (ref 0.50–1.10)
GFR calc Af Amer: >90 mL/min (ref >90)
GFR calc non Af Amer: >90 mL/min (ref >90)
Glucose, Bld: 82 mg/dL (ref 70–99)
Potassium: 3.8 mEq/L (ref 3.5–5.1)
Sodium: 141 mEq/L (ref 135–145)
Total Bilirubin: 0.4 mg/dL (ref 0.3–1.2)
Total Protein: 7.3 g/dL (ref 6.0–8.3)

## 2011-08-28 LAB — URINALYSIS, ROUTINE W REFLEX MICROSCOPIC
Bilirubin Urine: NEGATIVE
Glucose, UA: NEGATIVE mg/dL
Hgb urine dipstick: NEGATIVE
Leukocytes, UA: NEGATIVE
Nitrite: NEGATIVE
Protein, ur: NEGATIVE mg/dL
Specific Gravity, Urine: 1.018 (ref 1.005–1.030)
Urobilinogen, UA: 0.2 mg/dL (ref 0.0–1.0)
pH: 7.5 (ref 5.0–8.0)

## 2011-08-28 LAB — URINE MICROSCOPIC-ADD ON

## 2011-08-28 LAB — LIPASE, BLOOD: Lipase: 137 U/L — ABNORMAL HIGH (ref 11–59)

## 2011-08-28 LAB — DIFFERENTIAL
Basophils Absolute: 0 10*3/uL (ref 0.0–0.1)
Basophils Relative: 0 % (ref 0–1)
Eosinophils Absolute: 0 10*3/uL (ref 0.0–0.7)
Eosinophils Relative: 0 % (ref 0–5)
Lymphocytes Relative: 10 % — ABNORMAL LOW (ref 12–46)
Lymphs Abs: 1.2 10*3/uL (ref 0.7–4.0)
Monocytes Absolute: 0.3 10*3/uL (ref 0.1–1.0)
Monocytes Relative: 3 % (ref 3–12)
Neutro Abs: 9.7 10*3/uL — ABNORMAL HIGH (ref 1.7–7.7)
Neutrophils Relative %: 87 % — ABNORMAL HIGH (ref 43–77)

## 2011-09-12 ENCOUNTER — Ambulatory Visit: Payer: Medicare Other | Admitting: Internal Medicine

## 2011-10-23 ENCOUNTER — Other Ambulatory Visit: Payer: Self-pay | Admitting: Internal Medicine

## 2011-10-24 ENCOUNTER — Telehealth: Payer: Self-pay | Admitting: *Deleted

## 2011-10-24 NOTE — Telephone Encounter (Signed)
Requested Medications     ALPRAZolam (XANAX) 0.5 MG tablet [Pharmacy Med Name: ALPRAZOLAM 0.5 MG TABLET]   take 1 tablet by mouth every 8 hours if needed . NEED APPOINTMENT FOR ANY MORE REFILLS.   Disp: 90 tablet R: 0 Start: 10/23/2011  Class: Normal   Originally ordered on: 04/21/2011  Last refill: 01/06/2010

## 2011-10-24 NOTE — Telephone Encounter (Signed)
OK to fill this prescription with additional refills x2 ROV 3 mo Thank you!

## 2011-10-26 MED ORDER — ALPRAZOLAM 0.5 MG PO TABS: 0.5000 mg | ORAL_TABLET | Freq: Every evening | ORAL | Status: DC | PRN

## 2011-10-26 NOTE — Telephone Encounter (Signed)
Refill left on pharmacy voicemail and pt scheduled f/u for 01/30/12 at 9:15am.

## 2011-11-14 ENCOUNTER — Ambulatory Visit: Payer: Medicare Other | Admitting: Internal Medicine

## 2011-12-11 ENCOUNTER — Telehealth: Payer: Self-pay

## 2011-12-11 NOTE — Telephone Encounter (Signed)
[facility] Triage Call Report Triage Record Num: [id number] Operator: Shelley Bray Patient Name: Shelley Bray Call Date & Time: 12/08/2011 9:10:40PM Patient Phone: [phone] PCP: Shelley Bray Patient Gender: Female PCP Fax : [phone] Patient DOB: 12-Feb-1946 Practice Name: [facility] Reason for Call: Caller: Shelley Bray; PCP: Shelley Bray; CB#: [id number]; Call Reason: productive Cough/Congestion, onset 1-29, Pt unware of color. Pt has sweats, no themometer. Pt has CP while Coughing. All emergent sxs r/o per Cough Protocol. Advised Pt to c/b on 2-2 for an appt d/t CP while coughing. Home care advice given. Protocol(s) Used: Cough - Adult Recommended Outcome per Protocol: See Provider within 24 hours Reason for Outcome: Sharp, fleeting chest pain only occurring with deep breath or cough AND not responsive to home care Care Advice: ~ 12/08/2011 9:17:54PM Page 1 of 1 [email or web address]

## 2011-12-15 ENCOUNTER — Ambulatory Visit (INDEPENDENT_AMBULATORY_CARE_PROVIDER_SITE_OTHER)
Admission: RE | Admit: 2011-12-15 | Discharge: 2011-12-15 | Disposition: A | Discharge status: Home / Self Care | Payer: Medicare Other | Source: Ambulatory Visit | Attending: Endocrinology | Admitting: Endocrinology

## 2011-12-15 ENCOUNTER — Encounter: Payer: Self-pay | Admitting: Endocrinology

## 2011-12-15 ENCOUNTER — Ambulatory Visit (INDEPENDENT_AMBULATORY_CARE_PROVIDER_SITE_OTHER): Payer: Medicare Other | Admitting: Endocrinology

## 2011-12-15 VITALS — BP 142/84 | HR 81 | Temp 97.3°F

## 2011-12-15 DIAGNOSIS — R059 Cough, unspecified: Secondary | ICD-10-CM

## 2011-12-15 DIAGNOSIS — R05 Cough: Secondary | ICD-10-CM

## 2011-12-15 MED ORDER — CEFUROXIME AXETIL 250 MG PO TABS: 250.0000 mg | ORAL_TABLET | Freq: Two times a day (BID) | ORAL | Status: AC

## 2011-12-15 MED ORDER — PROMETHAZINE-CODEINE 6.25-10 MG/5ML PO SYRP: 5.0000 mL | ORAL_SOLUTION | ORAL | Status: AC | PRN

## 2011-12-15 NOTE — Patient Instructions (Addendum)
A chest-x-ray is being requested for you today.  please call [phone] to hear your test results.  You will be prompted to enter the 9-digit "MRN" number that appears at the top left of this page, followed by #.  Then you will hear the message. i have sent a prescription to your pharmacy, for an antibiotic.   here is a sample of "[id number]."  take 1 puff 2x a day.  rinse mouth after using. I hope you feel better soon.  If you don't feel better by next week, please call dr Posey Rea Here is a prescription for cough syrup.  You should not take this along with alprazolam.  (update: i left message on phone-tree:  rx as we discussed)

## 2011-12-15 NOTE — Progress Notes (Signed)
  Subjective:    Patient ID: Shelley Bray, female    DOB: 1946/10/17, 66 y.o.   MRN: [id number]  HPI Pt states 1 week of prod-quality cough in the chest, and assoc wheezing. Past Medical History  Diagnosis Date  . ANXIETY   . FIBROCYSTIC BREAST DISEASE   . GERD   . HYPERLIPIDEMIA   . HYPERTENSION   . SHELLFISH ALLERGY   . Memory loss     after son died in  04/29/01    No past surgical history on file.  History   Social History  . Marital Status: Married    Spouse Name: N/A    Number of Children: N/A  . Years of Education: N/A   Occupational History  . Not on file.   Social History Main Topics  . Smoking status: Never Smoker   . Smokeless tobacco: Not on file  . Alcohol Use: No  . Drug Use: No  . Sexually Active:    Other Topics Concern  . Not on file   Social History Narrative  . No narrative on file    Current Outpatient Prescriptions on File Prior to Visit  Medication Sig Dispense Refill  . albuterol (PROVENTIL,VENTOLIN) 90 MCG/ACT inhaler Inhale 2 puffs into the lungs every 6 (six) hours as needed for wheezing.  17 g  12  . ALPRAZolam (XANAX) 0.5 MG tablet Take 1 tablet (0.5 mg total) by mouth at bedtime as needed.  90 tablet  2  . Cholecalciferol (VITAMIN D3) 1000 UNITS CAPS Take by mouth daily.        Marland Kitchen dexlansoprazole (DEXILANT) 60 MG capsule Take 60 mg by mouth daily.        . diphenhydrAMINE (BENADRYL) 25 MG tablet Take 25 mg by mouth 4 (four) times daily as needed.        . galantamine (RAZADYNE) 12 MG tablet Take 12 mg by mouth 2 (two) times daily.        Marland Kitchen ibuprofen (ADVIL,MOTRIN) 400 MG tablet Take 1 tablet (400 mg total) by mouth 2 (two) times daily as needed.  60 tablet  5  . methylphenidate (RITALIN LA) 10 MG 24 hr capsule Take 1 capsule (10 mg total) by mouth daily. Please fill on or after 08/10/11   30 capsule  0    Allergies  Allergen Reactions  . Crestor (Rosuvastatin Calcium)     weak  . Shellfish Allergy     Family History  Problem  Relation Age of Onset  . Breast cancer Other   . Coronary artery disease Other   . Cervical cancer Other   . Ovarian cancer Other     BP 142/84  Pulse 81  Temp(Src) 97.3 F (36.3 C) (Oral)  SpO2 95%  Review of Systems Denies fever and sob    Objective:   Physical Exam VITAL SIGNS:  See vs page GENERAL: no distress head: no deformity eyes: no periorbital swelling, no proptosis external nose and ears are normal mouth: no lesion seen LUNGS:  Clear to auscultation, except for a few wheezes.       CXR: NAD    Assessment & Plan:  Acute bronchitis, new HTN, with situational component

## 2012-01-30 ENCOUNTER — Ambulatory Visit (INDEPENDENT_AMBULATORY_CARE_PROVIDER_SITE_OTHER): Payer: Medicare Other | Admitting: Internal Medicine

## 2012-01-30 ENCOUNTER — Encounter: Payer: Self-pay | Admitting: Internal Medicine

## 2012-01-30 VITALS — BP 148/98 | HR 88 | Temp 97.8°F | Resp 16 | Wt 195.0 lb

## 2012-01-30 DIAGNOSIS — F411 Generalized anxiety disorder: Secondary | ICD-10-CM

## 2012-01-30 DIAGNOSIS — G3184 Mild cognitive impairment, so stated: Secondary | ICD-10-CM

## 2012-01-30 DIAGNOSIS — E785 Hyperlipidemia, unspecified: Secondary | ICD-10-CM

## 2012-01-30 DIAGNOSIS — F988 Other specified behavioral and emotional disorders with onset usually occurring in childhood and adolescence: Secondary | ICD-10-CM

## 2012-01-30 DIAGNOSIS — K219 Gastro-esophageal reflux disease without esophagitis: Secondary | ICD-10-CM

## 2012-01-30 DIAGNOSIS — I10 Essential (primary) hypertension: Secondary | ICD-10-CM

## 2012-01-30 MED ORDER — METHYLPHENIDATE HCL ER (LA) 10 MG PO CP24: 10.0000 mg | ORAL_CAPSULE | Freq: Every day | ORAL | Status: DC

## 2012-01-30 MED ORDER — ALPRAZOLAM 0.5 MG PO TABS: 0.5000 mg | ORAL_TABLET | Freq: Every evening | ORAL | Status: DC | PRN

## 2012-01-30 MED ORDER — GALANTAMINE HYDROBROMIDE 12 MG PO TABS: 12.0000 mg | ORAL_TABLET | Freq: Two times a day (BID) | ORAL | Status: DC

## 2012-01-30 MED ORDER — DEXLANSOPRAZOLE 60 MG PO CPDR: 60.0000 mg | DELAYED_RELEASE_CAPSULE | Freq: Every day | ORAL | Status: DC

## 2012-01-30 MED ORDER — HYDROCHLOROTHIAZIDE 12.5 MG PO CAPS: 12.5000 mg | ORAL_CAPSULE | Freq: Every day | ORAL | Status: DC

## 2012-01-30 NOTE — Assessment & Plan Note (Signed)
Continue with current prescription therapy as reflected on the Med list.  

## 2012-01-30 NOTE — Progress Notes (Signed)
Patient ID: Shelley Bray, female   DOB: August 14, 1946, 66 y.o.   MRN: [id number]  Subjective:    Patient ID: Shelley Bray, female    DOB: Jul 29, 1946, 66 y.o.   MRN: [id number]  Headache  Pertinent negatives include no abdominal pain, back pain, coughing, dizziness, nausea, numbness, sinus pressure or weakness.     The patient is here to follow up on chronic memory and focus issues, depression, anxiety, headaches and chronic grief reaction Off Ritalin x months  Review of Systems  Constitutional: Negative for chills, activity change, appetite change, fatigue and unexpected weight change.  HENT: Negative for congestion, mouth sores and sinus pressure.   Eyes: Negative for visual disturbance.  Respiratory: Negative for cough and chest tightness.   Gastrointestinal: Negative for nausea and abdominal pain.  Genitourinary: Negative for frequency, difficulty urinating and vaginal pain.  Musculoskeletal: Negative for back pain and gait problem.  Skin: Negative for pallor and rash.  Neurological: Positive for headaches. Negative for dizziness, tremors, weakness and numbness.  Psychiatric/Behavioral: Positive for suicidal ideas. Negative for confusion and sleep disturbance. The patient is nervous/anxious.    Wt Readings from Last 3 Encounters:  01/30/12 195 lb (88.451 kg)  08/09/11 192 lb 6.4 oz (87.272 kg)  07/11/11 196 lb (88.905 kg)   BP Readings from Last 3 Encounters:  01/30/12 148/98  12/15/11 142/84  08/09/11 122/82       Objective:   Physical Exam  Constitutional: She appears well-developed and well-nourished. No distress.  HENT:  Head: Normocephalic.  Right Ear: External ear normal.  Left Ear: External ear normal.  Nose: Nose normal.  Mouth/Throat: Oropharynx is clear and moist.  Eyes: Conjunctivae are normal. Pupils are equal, round, and reactive to light. Right eye exhibits no discharge. Left eye exhibits no discharge.  Neck: Normal range of motion. Neck supple. No JVD  present. No tracheal deviation present. No thyromegaly present.  Cardiovascular: Normal rate, regular rhythm and normal heart sounds.   Pulmonary/Chest: No stridor. No respiratory distress. She has no wheezes.  Abdominal: Soft. Bowel sounds are normal. She exhibits no distension and no mass. There is no tenderness. There is no rebound and no guarding.  Musculoskeletal: She exhibits no edema and no tenderness.  Lymphadenopathy:    She has no cervical adenopathy.  Neurological: She displays normal reflexes. No cranial nerve deficit. She exhibits normal muscle tone. Coordination normal.  Skin: No rash noted. No erythema.  Psychiatric: She has a normal mood and affect. Her behavior is normal. Judgment and thought content normal.      Lab Results  Component Value Date   WBC 11.2* 08/28/2011   HGB 12.0 08/28/2011   HCT 36.2 08/28/2011   PLT 228 08/28/2011   CHOL 237* 04/25/2011   TRIG 57.0 04/25/2011   HDL 65.90 04/25/2011   LDLDIRECT 151.7 04/25/2011   ALT 13 08/28/2011   AST 16 08/28/2011   NA 141 08/28/2011   K 3.8 08/28/2011   CL 106 08/28/2011   CREATININE 0.63 08/28/2011   BUN 11 08/28/2011   CO2 24 08/28/2011   TSH 1.31 04/25/2011       Assessment & Plan:

## 2012-01-30 NOTE — Assessment & Plan Note (Signed)
  On diet  

## 2012-01-30 NOTE — Patient Instructions (Signed)
Normal BP<130/85 

## 2012-01-30 NOTE — Assessment & Plan Note (Signed)
Restart Ritalin

## 2012-01-30 NOTE — Assessment & Plan Note (Signed)
Restart Ritalin 

## 2012-01-30 NOTE — Assessment & Plan Note (Signed)
NAS Try HCTZ

## 2012-02-20 ENCOUNTER — Other Ambulatory Visit: Payer: Self-pay | Admitting: *Deleted

## 2012-02-20 MED ORDER — DEXLANSOPRAZOLE 60 MG PO CPDR: 60.0000 mg | DELAYED_RELEASE_CAPSULE | Freq: Every day | ORAL | Status: DC

## 2012-02-21 ENCOUNTER — Telehealth: Payer: Self-pay | Admitting: *Deleted

## 2012-02-21 NOTE — Telephone Encounter (Signed)
Pt is requesting 90 day supply of Alprazolam. Please advise.

## 2012-02-21 NOTE — Telephone Encounter (Signed)
OK to fill this prescription with additional refills x1 Thank you!  

## 2012-02-22 MED ORDER — ALPRAZOLAM 0.5 MG PO TABS: 0.5000 mg | ORAL_TABLET | Freq: Every evening | ORAL | Status: DC | PRN

## 2012-02-22 NOTE — Telephone Encounter (Signed)
Done- left detailed mess informing pt Rx phoned in.

## 2012-02-26 ENCOUNTER — Other Ambulatory Visit: Payer: Self-pay | Admitting: *Deleted

## 2012-02-26 MED ORDER — GALANTAMINE HYDROBROMIDE 12 MG PO TABS: 12.0000 mg | ORAL_TABLET | Freq: Two times a day (BID) | ORAL | Status: DC

## 2012-03-04 ENCOUNTER — Other Ambulatory Visit: Payer: Self-pay | Admitting: *Deleted

## 2012-03-04 ENCOUNTER — Ambulatory Visit (INDEPENDENT_AMBULATORY_CARE_PROVIDER_SITE_OTHER): Payer: Medicare Other | Admitting: Endocrinology

## 2012-03-04 ENCOUNTER — Encounter: Payer: Self-pay | Admitting: Endocrinology

## 2012-03-04 VITALS — BP 122/84 | HR 96 | Temp 98.5°F

## 2012-03-04 DIAGNOSIS — J209 Acute bronchitis, unspecified: Secondary | ICD-10-CM

## 2012-03-04 MED ORDER — TRAMADOL HCL 50 MG PO TABS: 50.0000 mg | ORAL_TABLET | Freq: Four times a day (QID) | ORAL | Status: DC | PRN

## 2012-03-04 MED ORDER — CEFUROXIME AXETIL 250 MG PO TABS: 250.0000 mg | ORAL_TABLET | Freq: Two times a day (BID) | ORAL | Status: AC

## 2012-03-04 NOTE — Progress Notes (Signed)
Subjective:    Patient ID: Shelley Bray, female    DOB: [birth year], 66 y.o.   MRN: [id number]  HPI Pt states dry-quality cough in the chest, and assoc itching of the eyes.  She has sore throat.  Past Medical History  Diagnosis Date  . ANXIETY   . FIBROCYSTIC BREAST DISEASE   . GERD   . HYPERLIPIDEMIA   . HYPERTENSION   . SHELLFISH ALLERGY   . Memory loss     after son died in  2001-04-26    No past surgical history on file.  History   Social History  . Marital Status: Married    Spouse Name: N/A    Number of Children: N/A  . Years of Education: N/A   Occupational History  . Not on file.   Social History Main Topics  . Smoking status: Never Smoker   . Smokeless tobacco: Not on file  . Alcohol Use: No  . Drug Use: No  . Sexually Active:    Other Topics Concern  . Not on file   Social History Narrative  . No narrative on file    Current Outpatient Prescriptions on File Prior to Visit  Medication Sig Dispense Refill  . ALPRAZolam (XANAX) 0.5 MG tablet Take 1 tablet (0.5 mg total) by mouth at bedtime as needed.  90 tablet  1  . Cholecalciferol (VITAMIN D3) 1000 UNITS CAPS Take by mouth daily.        Marland Kitchen dexlansoprazole (DEXILANT) 60 MG capsule Take 1 capsule (60 mg total) by mouth daily.  180 capsule  1  . galantamine (RAZADYNE) 12 MG tablet Take 1 tablet (12 mg total) by mouth 2 (two) times daily.  180 tablet  2  . hydrochlorothiazide (MICROZIDE) 12.5 MG capsule Take 1 capsule (12.5 mg total) by mouth daily.  90 capsule  3  . ibuprofen (ADVIL,MOTRIN) 400 MG tablet Take 1 tablet (400 mg total) by mouth 2 (two) times daily as needed.  60 tablet  5  . methylphenidate (RITALIN LA) 10 MG 24 hr capsule Take 1 capsule (10 mg total) by mouth daily. q am Please fill on or after 03/31/12  30 capsule  0  . Multiple Vitamin (MULTI-VITAMIN PO) Take by mouth daily.      Marland Kitchen albuterol (PROVENTIL,VENTOLIN) 90 MCG/ACT inhaler Inhale 2 puffs into the lungs every 6 (six) hours as needed for  wheezing.  17 g  12  . diphenhydrAMINE (BENADRYL) 25 MG tablet Take 25 mg by mouth 4 (four) times daily as needed.          Allergies  Allergen Reactions  . Crestor (Rosuvastatin Calcium)     weak  . Shellfish Allergy     Family History  Problem Relation Age of Onset  . Breast cancer Other   . Coronary artery disease Other   . Cervical cancer Other   . Ovarian cancer Other     BP 122/84  Pulse 96  Temp(Src) 98.5 F (36.9 C) (Oral)  SpO2 96%  Review of Systems Denies visual loss.  She is uncertain if he has had fever.      Objective:   Physical Exam VITAL SIGNS:  See vs page GENERAL: no distress head: no deformity eyes: moderate bilat conjunctival injection.  no periorbital swelling, no proptosis external nose and ears are normal mouth: no lesion seen Both eac's and tm's are normal LUNGS:  Clear to auscultation    Assessment & Plan:  Glenford Peers, new Acute conjunctivitis, new.  This  may be allergic etiology to this Acute bronchitis, new

## 2012-03-04 NOTE — Telephone Encounter (Signed)
Tramadol is for cough

## 2012-03-04 NOTE — Telephone Encounter (Signed)
Weston Brass, Pharmacist at [facility] informed.

## 2012-03-04 NOTE — Patient Instructions (Addendum)
i have sent 2 prescriptions to your pharmacy: for an antibiotic, and cough medication. Take "naphcon" eye drops as needed for the eye symptoms. I hope you feel better soon.  If you don't feel better by next week, please call back.  Loratadine-d (non-prescription) will help your congestion.

## 2012-03-04 NOTE — Telephone Encounter (Signed)
[facility] called on behalf of pt, they did not receive rx for cough medication for pt-they did receive rx for antibiotic.

## 2012-03-22 ENCOUNTER — Telehealth: Payer: Self-pay | Admitting: *Deleted

## 2012-03-22 NOTE — Telephone Encounter (Signed)
Rf req for tramadol 50 mg 1 po qd prn. #90 Ok to Rf? Last fill 06/2011.

## 2012-03-26 MED ORDER — TRAMADOL HCL 50 MG PO TABS: 50.0000 mg | ORAL_TABLET | Freq: Every day | ORAL | Status: AC | PRN

## 2012-03-26 NOTE — Telephone Encounter (Signed)
Ok #90 no ref Thx

## 2012-03-26 NOTE — Telephone Encounter (Signed)
Done

## 2012-05-07 ENCOUNTER — Other Ambulatory Visit (INDEPENDENT_AMBULATORY_CARE_PROVIDER_SITE_OTHER): Payer: Medicare Other

## 2012-05-07 DIAGNOSIS — I10 Essential (primary) hypertension: Secondary | ICD-10-CM

## 2012-05-07 DIAGNOSIS — F411 Generalized anxiety disorder: Secondary | ICD-10-CM

## 2012-05-07 DIAGNOSIS — F988 Other specified behavioral and emotional disorders with onset usually occurring in childhood and adolescence: Secondary | ICD-10-CM

## 2012-05-07 DIAGNOSIS — E785 Hyperlipidemia, unspecified: Secondary | ICD-10-CM

## 2012-05-07 DIAGNOSIS — K219 Gastro-esophageal reflux disease without esophagitis: Secondary | ICD-10-CM

## 2012-05-07 DIAGNOSIS — G3184 Mild cognitive impairment, so stated: Secondary | ICD-10-CM

## 2012-05-07 LAB — BASIC METABOLIC PANEL
BUN: 15 mg/dL (ref 6–23)
CO2: 29 mEq/L (ref 19–32)
Calcium: 9.7 mg/dL (ref 8.4–10.5)
Chloride: 107 mEq/L (ref 96–112)
Creatinine, Ser: 0.7 mg/dL (ref 0.4–1.2)
GFR: 103.67 mL/min (ref >60.00)
Glucose, Bld: 97 mg/dL (ref 70–99)
Potassium: 4.7 mEq/L (ref 3.5–5.1)
Sodium: 143 mEq/L (ref 135–145)

## 2012-05-08 ENCOUNTER — Ambulatory Visit (INDEPENDENT_AMBULATORY_CARE_PROVIDER_SITE_OTHER): Payer: Medicare Other | Admitting: Internal Medicine

## 2012-05-08 ENCOUNTER — Encounter: Payer: Self-pay | Admitting: Internal Medicine

## 2012-05-08 VITALS — BP 130/80 | HR 88 | Temp 97.5°F | Resp 16 | Wt 195.0 lb

## 2012-05-08 DIAGNOSIS — Z634 Disappearance and death of family member: Secondary | ICD-10-CM

## 2012-05-08 DIAGNOSIS — F988 Other specified behavioral and emotional disorders with onset usually occurring in childhood and adolescence: Secondary | ICD-10-CM

## 2012-05-08 DIAGNOSIS — R109 Unspecified abdominal pain: Secondary | ICD-10-CM | POA: Insufficient documentation

## 2012-05-08 DIAGNOSIS — F411 Generalized anxiety disorder: Secondary | ICD-10-CM

## 2012-05-08 DIAGNOSIS — F4321 Adjustment disorder with depressed mood: Secondary | ICD-10-CM | POA: Insufficient documentation

## 2012-05-08 MED ORDER — MEPERIDINE HCL 50 MG/ML IJ SOLN
50.0000 mg | Freq: Once | INTRAMUSCULAR | Status: AC
  Administered 2012-05-08: 50 mg via INTRAMUSCULAR

## 2012-05-08 MED ORDER — OXYCODONE HCL 5 MG PO TABS: 5.0000 mg | ORAL_TABLET | ORAL | Status: DC | PRN

## 2012-05-08 MED ORDER — PROMETHAZINE HCL 50 MG/ML IJ SOLN
50.0000 mg | Freq: Four times a day (QID) | INTRAMUSCULAR | Status: DC | PRN
  Administered 2012-05-08: 50 mg via INTRAMUSCULAR

## 2012-05-08 MED ORDER — TRAMADOL HCL 50 MG PO TABS: 50.0000 mg | ORAL_TABLET | Freq: Two times a day (BID) | ORAL | Status: AC | PRN

## 2012-05-08 MED ORDER — HYOSCYAMINE SULFATE 0.125 MG PO TABS: 0.1250 mg | ORAL_TABLET | ORAL | Status: DC | PRN

## 2012-05-08 NOTE — Assessment & Plan Note (Addendum)
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 Normally the pain would resolve after 2-6 hrs S/p full GI w/up in 2012 Dr Loreta Ave  Dem/phen 50-50 given IM Husband picked her up  Tramadol and Levsin prn (she declined Oxycodone)  Call later today if not well

## 2012-05-08 NOTE — Assessment & Plan Note (Signed)
Using Ritalin very rarely

## 2012-05-08 NOTE — Progress Notes (Signed)
   Subjective:    Patient ID: Shelley Bray, female    DOB: 09-Mar-1946, 66 y.o.   MRN: [id number]  Abdominal Pain This is a recurrent problem. The current episode started today. The onset quality is gradual. The problem occurs rarely. The problem has been gradually worsening. The pain is located in the LLQ. The pain is at a severity of 10/10. The pain is severe. The quality of the pain is sharp and a sensation of fullness. The abdominal pain radiates to the LLQ. Pertinent negatives include no frequency. The treatment provided no relief. Prior diagnostic workup includes CT scan, ultrasound and GI consult (2012 Dr Loreta Ave).     The patient is here to follow up on chronic memory and focus issues, depression, anxiety, headaches and chronic grief reaction   Ritalin - very rare prn  Review of Systems  Constitutional: Negative for chills, activity change, appetite change, fatigue and unexpected weight change.  HENT: Negative for congestion and mouth sores.   Eyes: Negative for visual disturbance.  Respiratory: Negative for chest tightness.   Genitourinary: Negative for frequency, difficulty urinating and vaginal pain.  Musculoskeletal: Negative for gait problem.  Skin: Negative for pallor and rash.  Neurological: Negative for tremors.  Psychiatric/Behavioral: Positive for suicidal ideas. Negative for confusion and disturbed wake/sleep cycle. The patient is nervous/anxious.    Wt Readings from Last 3 Encounters:  05/08/12 195 lb (88.451 kg)  01/30/12 195 lb (88.451 kg)  08/09/11 192 lb 6.4 oz (87.272 kg)   BP Readings from Last 3 Encounters:  05/08/12 130/80  03/04/12 122/84  01/30/12 148/98       Objective:   Physical Exam  Constitutional: She appears well-developed and well-nourished. No distress.  HENT:  Head: Normocephalic.  Right Ear: External ear normal.  Left Ear: External ear normal.  Nose: Nose normal.  Mouth/Throat: Oropharynx is clear and moist.  Eyes: Conjunctivae are  normal. Pupils are equal, round, and reactive to light. Right eye exhibits no discharge. Left eye exhibits no discharge.  Neck: Normal range of motion. Neck supple. No JVD present. No tracheal deviation present. No thyromegaly present.  Cardiovascular: Normal rate, regular rhythm and normal heart sounds.   Pulmonary/Chest: No stridor. No respiratory distress. She has no wheezes.  Abdominal: Soft. Bowel sounds are normal. She exhibits no distension and no mass. There is no tenderness. There is no rebound and no guarding.  Musculoskeletal: She exhibits no edema and no tenderness.  Lymphadenopathy:    She has no cervical adenopathy.  Neurological: She displays normal reflexes. No cranial nerve deficit. She exhibits normal muscle tone. Coordination normal.  Skin: No rash noted. No erythema.  Psychiatric: She has a normal mood and affect. Her behavior is normal. Judgment and thought content normal.      Lab Results  Component Value Date   WBC 11.2* 08/28/2011   HGB 12.0 08/28/2011   HCT 36.2 08/28/2011   PLT 228 08/28/2011   CHOL 237* 04/25/2011   TRIG 57.0 04/25/2011   HDL 65.90 04/25/2011   LDLDIRECT 151.7 04/25/2011   ALT 13 08/28/2011   AST 16 08/28/2011   NA 143 05/07/2012   K 4.7 05/07/2012   CL 107 05/07/2012   CREATININE 0.7 05/07/2012   BUN 15 05/07/2012   CO2 29 05/07/2012   TSH 1.31 04/25/2011       Assessment & Plan:

## 2012-05-08 NOTE — Assessment & Plan Note (Signed)
Continue with current prescription therapy as reflected on the Med list.  

## 2012-05-08 NOTE — Assessment & Plan Note (Signed)
Her son died of DM1 complications

## 2012-10-11 ENCOUNTER — Telehealth: Payer: Self-pay | Admitting: Internal Medicine

## 2012-10-11 NOTE — Telephone Encounter (Signed)
Noted. Thx.

## 2012-10-11 NOTE — Telephone Encounter (Signed)
Patient Information:  Caller Name: [place]  Phone: [phone]  Patient: Shelley Bray  Gender: Female  DOB: [birth year]  Age: 66 Years  PCP: Plotnikov, Alex (Adults only)   Symptoms  Reason For Call & Symptoms: Patient requesting mobic Rx for knee pain.  States onset of worsening bilateral knee pain over past 2 weeks.  States has been diagnosed with osteoarthritis in the past, and mobic did help.  Reviewed Health History In EMR: Yes  Reviewed Medications In EMR: Yes  Reviewed Allergies In EMR: Yes  Reviewed Surgeries / Procedures: Yes  Date of Onset of Symptoms: 09/27/2012  Guideline(s) Used:  Knee Pain  Disposition Per Guideline:   See Within 3 Days in Office  Reason For Disposition Reached:   Knee pain persists > 7 days  Advice Given:  N/A  Office Follow Up:  Does the office need to follow up with this patient?: Yes  Instructions For The Office: Requests Rx for Mobic; did schedule follow up appt 10/18/12 but requests Rx before then  krs/can  Patient Refused Recommendation:  Patient Requests Prescription  Requests Rx for Mobic; did schedule follow up appt 10/18/12 but requests Rx before then  krs/can  RN Note:  Medication prescribed in past by prior provider.  Denies emergent symptoms per protocol; advised appt within 72 hours.  Patient declines appt, as she has her 25-year-old granddaughter to care for today; requests Mobic Rx.  Patient did scheduled 6 month follow up appt with Dr. Posey Rea 10/18/12 0900.  Info to office for provider review/Rx/callback.   Uses [facility]. May reach patient at [phone] or cell [phone].

## 2012-10-14 ENCOUNTER — Ambulatory Visit (INDEPENDENT_AMBULATORY_CARE_PROVIDER_SITE_OTHER): Payer: Medicare Other | Admitting: Internal Medicine

## 2012-10-14 ENCOUNTER — Encounter: Payer: Self-pay | Admitting: Internal Medicine

## 2012-10-14 ENCOUNTER — Other Ambulatory Visit (INDEPENDENT_AMBULATORY_CARE_PROVIDER_SITE_OTHER): Payer: Medicare Other

## 2012-10-14 VITALS — BP 150/92 | HR 90 | Temp 97.0°F | Ht 67.0 in | Wt 187.0 lb

## 2012-10-14 DIAGNOSIS — R5383 Other fatigue: Secondary | ICD-10-CM

## 2012-10-14 DIAGNOSIS — E785 Hyperlipidemia, unspecified: Secondary | ICD-10-CM

## 2012-10-14 DIAGNOSIS — M25569 Pain in unspecified knee: Secondary | ICD-10-CM

## 2012-10-14 DIAGNOSIS — R5381 Other malaise: Secondary | ICD-10-CM

## 2012-10-14 DIAGNOSIS — Z130 Encounter for screening for diseases of the blood and blood-forming organs and certain disorders involving the immune mechanism: Secondary | ICD-10-CM

## 2012-10-14 LAB — CBC
HCT: 36.8 % (ref 36.0–46.0)
Hemoglobin: 12.2 g/dL (ref 12.0–15.0)
MCHC: 33.1 g/dL (ref 30.0–36.0)
MCV: 86.1 fl (ref 78.0–100.0)
Platelets: 264 10*3/uL (ref 150.0–400.0)
RBC: 4.28 Mil/uL (ref 3.87–5.11)
RDW: 13.4 % (ref 11.5–14.6)
WBC: 6.1 10*3/uL (ref 4.5–10.5)

## 2012-10-14 LAB — LIPID PANEL
Cholesterol: 218 mg/dL — ABNORMAL HIGH (ref 0–200)
HDL: 57.7 mg/dL (ref >39.00)
Total CHOL/HDL Ratio: 4
Triglycerides: 89 mg/dL (ref 0.0–149.0)
VLDL: 17.8 mg/dL (ref 0.0–40.0)

## 2012-10-14 LAB — LDL CHOLESTEROL, DIRECT: Direct LDL: 152.9 mg/dL

## 2012-10-14 NOTE — Patient Instructions (Signed)
Knee Exercises  EXERCISES  RANGE OF MOTION(ROM) AND STRETCHING EXERCISES  These exercises may help you when beginning to rehabilitate your injury. Your symptoms may resolve with or without further involvement from your physician, physical therapist or athletic trainer. While completing these exercises, remember:   · Restoring tissue flexibility helps normal motion to return to the joints. This allows healthier, less painful movement and activity.  · An effective stretch should be held for at least 30 seconds.  · A stretch should never be painful. You should only feel a gentle lengthening or release in the stretched tissue.  STRETCH - Knee Extension, Prone  · Lie on your stomach on a firm surface, such as a bed or countertop. Place your right / left knee and leg just beyond the edge of the surface. You may wish to place a towel under the far end of your right / left thigh for comfort.  · Relax your leg muscles and allow gravity to straighten your knee. Your clinician may advise you to add an ankle weight if more resistance is helpful for you.  · You should feel a stretch in the back of your right / left knee. Hold this position for __________ seconds.  Repeat __________ times. Complete this stretch __________ times per day.  * Your physician, physical therapist or athletic trainer may ask you to add ankle weight to enhance your stretch.   RANGE OF MOTION - Knee Flexion, Active  · Lie on your back with both knees straight. (If this causes back discomfort, bend your opposite knee, placing your foot flat on the floor.)  · Slowly slide your heel back toward your buttocks until you feel a gentle stretch in the front of your knee or thigh.  · Hold for __________ seconds. Slowly slide your heel back to the starting position.  Repeat __________ times. Complete this exercise __________ times per day.   STRETCH - Quadriceps, Prone   · Lie on your stomach on a firm surface, such as a bed or padded floor.  · Bend your right /  left knee and grasp your ankle. If you are unable to reach, your ankle or pant leg, use a belt around your foot to lengthen your reach.  · Gently pull your heel toward your buttocks. Your knee should not slide out to the side. You should feel a stretch in the front of your thigh and/or knee.  · Hold this position for __________ seconds.  Repeat __________ times. Complete this stretch __________ times per day.   STRETCH - Hamstrings, Supine   · Lie on your back. Loop a belt or towel over the ball of your right / left foot.  · Straighten your right / left knee and slowly pull on the belt to raise your leg. Do not allow the right / left knee to bend. Keep your opposite leg flat on the floor.  · Raise the leg until you feel a gentle stretch behind your right / left knee or thigh. Hold this position for __________ seconds.  Repeat __________ times. Complete this stretch __________ times per day.   STRENGTHENING EXERCISES  These exercises may help you when beginning to rehabilitate your injury. They may resolve your symptoms with or without further involvement from your physician, physical therapist or athletic trainer. While completing these exercises, remember:   · Muscles can gain both the endurance and the strength needed for everyday activities through controlled exercises.  · Complete these exercises as instructed by your physician,   physical therapist or athletic trainer. Progress the resistance and repetitions only as guided.  · You may experience muscle soreness or fatigue, but the pain or discomfort you are trying to eliminate should never worsen during these exercises. If this pain does worsen, stop and make certain you are following the directions exactly. If the pain is still present after adjustments, discontinue the exercise until you can discuss the trouble with your clinician.  STRENGTH - Quadriceps, Isometrics  · Lie on your back with your right / left leg extended and your opposite knee  bent.  · Gradually tense the muscles in the front of your right / left thigh. You should see either your knee cap slide up toward your hip or increased dimpling just above the knee. This motion will push the back of the knee down toward the floor/mat/bed on which you are lying.  · Hold the muscle as tight as you can without increasing your pain for __________ seconds.  · Relax the muscles slowly and completely in between each repetition.  Repeat __________ times. Complete this exercise __________ times per day.   STRENGTH - Quadriceps, Short Arcs   · Lie on your back. Place a __________ inch towel roll under your knee so that the knee slightly bends.  · Raise only your lower leg by tightening the muscles in the front of your thigh. Do not allow your thigh to rise.  · Hold this position for __________ seconds.  Repeat __________ times. Complete this exercise __________ times per day.   OPTIONAL ANKLE WEIGHTS: Begin with ____________________, but DO NOT exceed ____________________. Increase in 1 pound/0.5 kilogram increments.   STRENGTH - Quadriceps, Straight Leg Raises   Quality counts! Watch for signs that the quadriceps muscle is working to insure you are strengthening the correct muscles and not "cheating" by substituting with healthier muscles.  · Lay on your back with your right / left leg extended and your opposite knee bent.  · Tense the muscles in the front of your right / left thigh. You should see either your knee cap slide up or increased dimpling just above the knee. Your thigh may even quiver.  · Tighten these muscles even more and raise your leg 4 to 6 inches off the floor. Hold for __________ seconds.  · Keeping these muscles tense, lower your leg.  · Relax the muscles slowly and completely in between each repetition.  Repeat __________ times. Complete this exercise __________ times per day.   STRENGTH - Hamstring, Curls  · Lay on your stomach with your legs extended. (If you lay on a bed, your feet  may hang over the edge.)  · Tighten the muscles in the back of your thigh to bend your right / left knee up to 90 degrees. Keep your hips flat on the bed/floor.  · Hold this position for __________ seconds.  · Slowly lower your leg back to the starting position.  Repeat __________ times. Complete this exercise __________ times per day.   OPTIONAL ANKLE WEIGHTS: Begin with ____________________, but DO NOT exceed ____________________. Increase in 1 pound/0.5 kilogram increments.   STRENGTH - Quadriceps, Squats  · Stand in a door frame so that your feet and knees are in line with the frame.  · Use your hands for balance, not support, on the frame.  · Slowly lower your weight, bending at the hips and knees. Keep your lower legs upright so that they are parallel with the door frame. Squat only within the range   that does not increase your knee pain. Never let your hips drop below your knees.  · Slowly return upright, pushing with your legs, not pulling with your hands.  Repeat __________ times. Complete this exercise __________ times per day.   STRENGTH - Quadriceps, Wall Slides   Follow guidelines for form closely. Increased knee pain often results from poorly placed feet or knees.  · Lean against a smooth wall or door and walk your feet out 18-24 inches. Place your feet hip-width apart.  · Slowly slide down the wall or door until your knees bend __________ degrees.* Keep your knees over your heels, not your toes, and in line with your hips, not falling to either side.  · Hold for __________ seconds. Stand up to rest for __________ seconds in between each repetition.  Repeat __________ times. Complete this exercise __________ times per day.  * Your physician, physical therapist or athletic trainer will alter this angle based on your symptoms and progress.  Document Released: 09/06/2005 Document Revised: 01/15/2012 Document Reviewed: 02/04/2009  [employer]® Patient Information ©2013 [employer].

## 2012-10-14 NOTE — Progress Notes (Signed)
Subjective:    Patient ID: Shelley Bray, female    DOB: 1946-06-15, 66 y.o.   MRN: [id number]  HPI  Pt presents to the clinic today with c/o knee pain. This is an ongoing problem. She has been diagnosed with ostearthritis and believes like it is flaring up in her knees due to the weather. The pain is intermittent 5/10, worse when she is trying to get up from sitting or walking up stairs. It is relieved by OTC ibuprofen. Additionally, she would like to have her labs checked since it has been more than a year.  Review of Systems      Past Medical History  Diagnosis Date  . ANXIETY   . FIBROCYSTIC BREAST DISEASE   . GERD   . HYPERLIPIDEMIA   . HYPERTENSION   . SHELLFISH ALLERGY   . Memory loss     after son died in  04/03/01    Current Outpatient Prescriptions  Medication Sig Dispense Refill  . albuterol (PROVENTIL,VENTOLIN) 90 MCG/ACT inhaler Inhale 2 puffs into the lungs every 6 (six) hours as needed for wheezing.  17 g  12  . ALPRAZolam (XANAX) 0.5 MG tablet Take 1 tablet (0.5 mg total) by mouth at bedtime as needed.  90 tablet  1  . Cholecalciferol (VITAMIN D3) 1000 UNITS CAPS Take by mouth daily.        Marland Kitchen dexlansoprazole (DEXILANT) 60 MG capsule Take 1 capsule (60 mg total) by mouth daily.  180 capsule  1  . diphenhydrAMINE (BENADRYL) 25 MG tablet Take 25 mg by mouth 4 (four) times daily as needed.        . galantamine (RAZADYNE) 12 MG tablet Take 1 tablet (12 mg total) by mouth 2 (two) times daily.  180 tablet  2  . hydrochlorothiazide (MICROZIDE) 12.5 MG capsule Take 1 capsule (12.5 mg total) by mouth daily.  90 capsule  3  . hyoscyamine (LEVSIN, ANASPAZ) 0.125 MG tablet Take 1-2 tablets (0.125-0.25 mg total) by mouth every 4 (four) hours as needed for cramping.  60 tablet  3  . ibuprofen (ADVIL,MOTRIN) 400 MG tablet Take 1 tablet (400 mg total) by mouth 2 (two) times daily as needed.  60 tablet  5  . methylphenidate (RITALIN LA) 10 MG 24 hr capsule Take 1 capsule (10 mg total)  by mouth daily. q am Please fill on or after 03/31/12  30 capsule  0  . Multiple Vitamin (MULTI-VITAMIN PO) Take by mouth daily.       Current Facility-Administered Medications  Medication Dose Route Frequency Provider Last Rate Last Dose  . promethazine (PHENERGAN) injection 50 mg  50 mg Intramuscular Q6H PRN Tresa Garter, MD   50 mg at 05/08/12 1106    Allergies  Allergen Reactions  . Crestor (Rosuvastatin Calcium)     weak  . Shellfish Allergy     Family History  Problem Relation Age of Onset  . Breast cancer Other   . Coronary artery disease Other   . Cervical cancer Other   . Ovarian cancer Other     History   Social History  . Marital Status: Married    Spouse Name: N/A    Number of Children: N/A  . Years of Education: N/A   Occupational History  . Not on file.   Social History Main Topics  . Smoking status: Never Smoker   . Smokeless tobacco: Not on file  . Alcohol Use: No  . Drug Use: No  . Sexually Active:  Other Topics Concern  . Not on file   Social History Narrative  . No narrative on file     Constitutional: Denies fever, malaise, fatigue, headache or abrupt weight changes.  Musculoskeletal: Pt reports knee pain. Denies decrease in range of motion, difficulty with gait, muscle pain or joint swelling.  Skin: Denies redness, rashes, lesions or ulcercations.  Neurological: Denies numbness or tingling in hands or feet, dizziness, difficulty with speech or problems with balance and coordination.   No other specific complaints in a complete review of systems (except as listed in HPI above).  Objective:   Physical Exam   BP 150/92  Pulse 90  Temp 97 F (36.1 C) (Oral)  Ht 5\' 7"  (1.702 m)  Wt 187 lb (84.823 kg)  BMI 29.29 kg/m2  SpO2 96% Wt Readings from Last 3 Encounters:  10/14/12 187 lb (84.823 kg)  05/08/12 195 lb (88.451 kg)  01/30/12 195 lb (88.451 kg)    General: Appears her stated age, well developed, well nourished in  NAD. Cardiovascular: Normal rate and rhythm. S1,S2 noted.  No murmur, rubs or gallops noted. No JVD or BLE edema. No carotid bruits noted. Pulmonary/Chest: Normal effort and positive vesicular breath sounds. No respiratory distress. No wheezes, rales or ronchi noted.  Musculoskeletal: Crepitus noted with range of motion. No signs of joint swelling. No difficulty with gait.  Neurological: Alert and oriented. Cranial nerves II-XII intact. Coordination normal. +DTRs bilaterally. Psychiatric: Mood and affect normal. Behavior is normal. Judgment and thought content normal.    BMET    Component Value Date/Time   NA 143 05/07/2012 0942   K 4.7 05/07/2012 0942   CL 107 05/07/2012 0942   CO2 29 05/07/2012 0942   GLUCOSE 97 05/07/2012 0942   BUN 15 05/07/2012 0942   CREATININE 0.7 05/07/2012 0942   CALCIUM 9.7 05/07/2012 0942   GFRNONAA >90 08/28/2011 1538   GFRAA >90 08/28/2011 1538    Lipid Panel     Component Value Date/Time   CHOL 237* 04/25/2011 1204   TRIG 57.0 04/25/2011 1204   HDL 65.90 04/25/2011 1204   CHOLHDL 4 04/25/2011 1204   VLDL 11.4 04/25/2011 1204   LDLCALC  Value: 80        Total Cholesterol/HDL:CHD Risk Coronary Heart Disease Risk Table                     Men   Women  1/2 Average Risk   3.4   3.3 07/06/2007 0642    CBC    Component Value Date/Time   WBC 11.2* 08/28/2011 1538   RBC 4.26 08/28/2011 1538   HGB 12.0 08/28/2011 1538   HCT 36.2 08/28/2011 1538   PLT 228 08/28/2011 1538   MCV 85.0 08/28/2011 1538   MCH 28.2 08/28/2011 1538   MCHC 33.1 08/28/2011 1538   RDW 13.0 08/28/2011 1538   LYMPHSABS 1.2 08/28/2011 1538   MONOABS 0.3 08/28/2011 1538   EOSABS 0.0 08/28/2011 1538   BASOSABS 0.0 08/28/2011 1538    Hgb A1C No results found for this basename: HGBA1C        Assessment & Plan:   Knee pain, new onset with additional workup required:  Pt declines RX for Diclofenac 75 mg BID at this time Apply ice to the affected area for 20 minutes daily Perform knee  exercises as instructed on patient handout Will check CBC and Lipids per patient request.  RTC as needed or if symptoms persist

## 2012-10-15 ENCOUNTER — Telehealth: Payer: Self-pay | Admitting: *Deleted

## 2012-10-15 ENCOUNTER — Encounter: Payer: Self-pay | Admitting: Internal Medicine

## 2012-10-15 NOTE — Telephone Encounter (Signed)
Pt informed of lab results, also faxed to pt at [phone] per pt's request.    Ash,  Can you please send this to Mrs. Klassen and let her know that her blood counts are normal but her cholesterol is elevated. I suggest 3 months of lifestyle changes which includes avoiding fast foods or fried foods combined with at least 30 minutes of exercise 4 days out of the week. Schedule and OV for 3 months to reevaluate this.

## 2012-10-18 ENCOUNTER — Ambulatory Visit: Payer: Self-pay | Admitting: Internal Medicine

## 2012-12-27 ENCOUNTER — Encounter: Payer: Self-pay | Admitting: Internal Medicine

## 2012-12-27 ENCOUNTER — Ambulatory Visit (INDEPENDENT_AMBULATORY_CARE_PROVIDER_SITE_OTHER): Payer: Medicare Other | Admitting: Internal Medicine

## 2012-12-27 ENCOUNTER — Other Ambulatory Visit (INDEPENDENT_AMBULATORY_CARE_PROVIDER_SITE_OTHER): Payer: Medicare Other

## 2012-12-27 VITALS — BP 130/90 | HR 80 | Temp 97.3°F | Resp 16 | Wt 190.0 lb

## 2012-12-27 DIAGNOSIS — I10 Essential (primary) hypertension: Secondary | ICD-10-CM

## 2012-12-27 DIAGNOSIS — E785 Hyperlipidemia, unspecified: Secondary | ICD-10-CM

## 2012-12-27 DIAGNOSIS — R0789 Other chest pain: Secondary | ICD-10-CM | POA: Insufficient documentation

## 2012-12-27 DIAGNOSIS — R079 Chest pain, unspecified: Secondary | ICD-10-CM | POA: Insufficient documentation

## 2012-12-27 DIAGNOSIS — G3184 Mild cognitive impairment, so stated: Secondary | ICD-10-CM

## 2012-12-27 DIAGNOSIS — K219 Gastro-esophageal reflux disease without esophagitis: Secondary | ICD-10-CM | POA: Insufficient documentation

## 2012-12-27 LAB — BASIC METABOLIC PANEL
BUN: 14 mg/dL (ref 6–23)
CO2: 29 mEq/L (ref 19–32)
Calcium: 9.4 mg/dL (ref 8.4–10.5)
Chloride: 105 mEq/L (ref 96–112)
Creatinine, Ser: 0.7 mg/dL (ref 0.4–1.2)
GFR: 105.16 mL/min (ref >60.00)
Glucose, Bld: 76 mg/dL (ref 70–99)
Potassium: 3.9 mEq/L (ref 3.5–5.1)
Sodium: 139 mEq/L (ref 135–145)

## 2012-12-27 LAB — HEPATIC FUNCTION PANEL
ALT: 15 U/L (ref 0–35)
AST: 20 U/L (ref 0–37)
Albumin: 3.8 g/dL (ref 3.5–5.2)
Alkaline Phosphatase: 115 U/L (ref 39–117)
Bilirubin, Direct: 0.1 mg/dL (ref 0.0–0.3)
Total Bilirubin: 0.7 mg/dL (ref 0.3–1.2)
Total Protein: 7.2 g/dL (ref 6.0–8.3)

## 2012-12-27 LAB — LIPASE: Lipase: 50 U/L (ref 11.0–59.0)

## 2012-12-27 LAB — CK: Total CK: 95 U/L (ref 7–177)

## 2012-12-27 MED ORDER — DEXLANSOPRAZOLE 60 MG PO CPDR: 60.0000 mg | DELAYED_RELEASE_CAPSULE | ORAL | Status: DC | PRN

## 2012-12-27 MED ORDER — ASPIRIN 81 MG PO CHEW: 81.0000 mg | CHEWABLE_TABLET | Freq: Every day | ORAL | Status: DC

## 2012-12-27 MED ORDER — NITROGLYCERIN 0.4 MG SL SUBL: 0.4000 mg | SUBLINGUAL_TABLET | SUBLINGUAL | Status: DC | PRN

## 2012-12-27 MED ORDER — ALPRAZOLAM 0.5 MG PO TABS: 0.5000 mg | ORAL_TABLET | Freq: Every evening | ORAL | Status: DC | PRN

## 2012-12-27 NOTE — Assessment & Plan Note (Signed)
Continue with current prescription therapy as reflected on the Med list.  

## 2012-12-27 NOTE — Assessment & Plan Note (Signed)
Card cons next wk To ER if relapsed Start ASA, start NTG prn if CP Labs EKG - ok

## 2012-12-27 NOTE — Patient Instructions (Signed)
Call 911 if chest pain

## 2012-12-27 NOTE — Assessment & Plan Note (Signed)
She was off Dexilant - restart or now

## 2012-12-27 NOTE — Progress Notes (Signed)
Patient ID: Shelley Bray, female   DOB: 1946-03-10, 67 y.o.   MRN: [id number]   Subjective:    Patient ID: Shelley Bray, female    DOB: Nov 02, 1946, 67 y.o.   MRN: [id number]  Chest Pain  This is a new problem. The current episode started in the past 7 days (last time on Mon; first on Wed last week). The onset quality is gradual. The problem occurs constantly. The problem has been resolved. The pain is present in the substernal region. The pain is at a severity of 8/10. The pain is severe. The quality of the pain is described as heavy and squeezing. The pain radiates to the left neck. The pain is aggravated by breathing. She has tried antacids, NSAIDs and rest for the symptoms. The treatment provided mild relief. Risk factors include post-menopausal.  Pertinent negatives for past medical history include no CAD, no CHF and no DVT.  Her family medical history is significant for CAD in family (brother MI in his 42s).     The patient is here to follow up on chronic memory and focus issues, depression, anxiety, headaches and chronic grief reaction   Ritalin - very rare prn  Review of Systems  Constitutional: Negative for chills, activity change, appetite change, fatigue and unexpected weight change.  HENT: Negative for congestion and mouth sores.   Eyes: Negative for visual disturbance.  Respiratory: Negative for chest tightness.   Cardiovascular: Positive for chest pain.  Genitourinary: Negative for difficulty urinating and vaginal pain.  Musculoskeletal: Negative for gait problem.  Skin: Negative for pallor and rash.  Neurological: Negative for tremors.  Psychiatric/Behavioral: Positive for suicidal ideas. Negative for confusion and sleep disturbance. The patient is nervous/anxious.    Wt Readings from Last 3 Encounters:  12/27/12 190 lb (86.183 kg)  10/14/12 187 lb (84.823 kg)  05/08/12 195 lb (88.451 kg)   BP Readings from Last 3 Encounters:  12/27/12 130/90  10/14/12 150/92   05/08/12 130/80       Objective:   Physical Exam  Constitutional: She appears well-developed and well-nourished. No distress.  HENT:  Head: Normocephalic.  Right Ear: External ear normal.  Left Ear: External ear normal.  Nose: Nose normal.  Mouth/Throat: Oropharynx is clear and moist.  Eyes: Conjunctivae are normal. Pupils are equal, round, and reactive to light. Right eye exhibits no discharge. Left eye exhibits no discharge.  Neck: Normal range of motion. Neck supple. No JVD present. No tracheal deviation present. No thyromegaly present.  Cardiovascular: Normal rate, regular rhythm and normal heart sounds.   Pulmonary/Chest: No stridor. No respiratory distress. She has no wheezes.  Abdominal: Soft. Bowel sounds are normal. She exhibits no distension and no mass. There is no tenderness. There is no rebound and no guarding.  Musculoskeletal: She exhibits no edema and no tenderness.  Lymphadenopathy:    She has no cervical adenopathy.  Neurological: She displays normal reflexes. No cranial nerve deficit. She exhibits normal muscle tone. Coordination normal.  Skin: No rash noted. No erythema.  Psychiatric: She has a normal mood and affect. Her behavior is normal. Judgment and thought content normal.      Lab Results  Component Value Date   WBC 6.1 10/14/2012   HGB 12.2 10/14/2012   HCT 36.8 10/14/2012   PLT 264.0 10/14/2012   CHOL 218* 10/14/2012   TRIG 89.0 10/14/2012   HDL 57.70 10/14/2012   LDLDIRECT 152.9 10/14/2012   ALT 13 08/28/2011   AST 16 08/28/2011   NA  143 05/07/2012   K 4.7 05/07/2012   CL 107 05/07/2012   CREATININE 0.7 05/07/2012   BUN 15 05/07/2012   CO2 29 05/07/2012   TSH 1.31 04/25/2011       Assessment & Plan:

## 2012-12-27 NOTE — Assessment & Plan Note (Signed)
  On diet  

## 2012-12-29 NOTE — Assessment & Plan Note (Signed)
Better  

## 2013-01-10 ENCOUNTER — Ambulatory Visit: Payer: Medicare Other | Admitting: Cardiovascular Disease

## 2013-02-12 ENCOUNTER — Other Ambulatory Visit: Payer: Self-pay | Admitting: Internal Medicine

## 2013-02-13 ENCOUNTER — Encounter: Payer: Self-pay | Admitting: Cardiovascular Disease

## 2013-02-13 ENCOUNTER — Ambulatory Visit (INDEPENDENT_AMBULATORY_CARE_PROVIDER_SITE_OTHER): Payer: Medicare Other | Admitting: Cardiovascular Disease

## 2013-02-13 VITALS — BP 138/92 | HR 65 | Ht 66.0 in | Wt 191.2 lb

## 2013-02-13 DIAGNOSIS — R079 Chest pain, unspecified: Secondary | ICD-10-CM

## 2013-02-13 NOTE — Patient Instructions (Addendum)
Your physician recommends that you schedule a follow-up appointment in:  5-6 weeks.   Your physician has requested that you have an echocardiogram. Echocardiography is a painless test that uses sound waves to create images of your heart. It provides your doctor with information about the size and shape of your heart and how well your heart's chambers and valves are working. This procedure takes approximately one hour. There are no restrictions for this procedure.   Your physician has requested that you have an exercise tolerance test. For further information please visit [email or web address] Please also follow instruction sheet, as given.

## 2013-02-13 NOTE — Progress Notes (Signed)
History of Present Illness: 68 yo female with history of HLD, HTN, GERD and anxiety disorder who is here today as a new patient for evaluation of chest pain. She tells me that she has been very busy taking care of her elderly mother in law. In late February she had an episode of chest pain that lasted for several hours. This was described as a pressure in her left chest. No associated SOB. She did feel nausea. No recurrence of chest pain or pressure over the last six weeks. No prior cardiac disease.   No complaints today. Denies chest pain, SOB, palpitations, near syncope, syncope, dizziness, LE edema, orthopnea, PND.   Primary Care Physician: Plotnikov  Past Medical History  Diagnosis Date  . ANXIETY   . GERD   . HYPERLIPIDEMIA   . HYPERTENSION   . SHELLFISH ALLERGY   . Memory loss     after son died in  2001-03-17    Past Surgical History  Procedure Laterality Date  . Tubal ligation      Current Outpatient Prescriptions  Medication Sig Dispense Refill  . ALPRAZolam (XANAX) 0.5 MG tablet Take 1 tablet (0.5 mg total) by mouth at bedtime as needed.  90 tablet  1  . aspirin (ASPIRIN CHILDRENS) 81 MG chewable tablet Chew 1 tablet (81 mg total) by mouth daily.  100 tablet  3  . Cholecalciferol (VITAMIN D3) 1000 UNITS CAPS Take by mouth daily.        Marland Kitchen dexlansoprazole (DEXILANT) 60 MG capsule Take 1 capsule (60 mg total) by mouth as needed.  30 capsule  11  . galantamine (RAZADYNE) 12 MG tablet Take 1 tablet (12 mg total) by mouth 2 (two) times daily.  180 tablet  2  . hydrochlorothiazide (MICROZIDE) 12.5 MG capsule Take 12.5 mg by mouth daily.      Marland Kitchen ibuprofen (ADVIL,MOTRIN) 400 MG tablet take 1 tablet by mouth twice a day if needed  60 tablet  5  . Multiple Vitamin (MULTI-VITAMIN PO) Take by mouth daily.      . nitroGLYCERIN (NITROSTAT) 0.4 MG SL tablet Place 1 tablet (0.4 mg total) under the tongue every 5 (five) minutes as needed for chest pain.  20 tablet  0   Current  Facility-Administered Medications  Medication Dose Route Frequency Provider Last Rate Last Dose  . promethazine (PHENERGAN) injection 50 mg  50 mg Intramuscular Q6H PRN Tresa Garter, MD   50 mg at 05/08/12 1106    Allergies  Allergen Reactions  . Benadryl (Diphenhydramine Hcl)     cough  . Crestor (Rosuvastatin Calcium)     weak  . Shellfish Allergy     History   Social History  . Marital Status: Married    Spouse Name: N/A    Number of Children: 2  . Years of Education: N/A   Occupational History  . Retired [employer]    Social History Main Topics  . Smoking status: Never Smoker   . Smokeless tobacco: Not on file  . Alcohol Use: No  . Drug Use: No  . Sexually Active: Not on file   Other Topics Concern  . Not on file   Social History Narrative  . No narrative on file    Family History  Problem Relation Age of Onset  . Breast cancer Sister   . Cervical cancer Sister   . Ovarian cancer Sister   . Heart attack Father 30  . Heart attack Brother 59  Review of Systems:  As stated in the HPI and otherwise negative.   BP 138/92  Pulse 65  Ht 5\' 6"  (1.676 m)  Wt 191 lb 3.2 oz (86.728 kg)  BMI 30.88 kg/m2  Physical Examination: General: Well developed, well nourished, NAD HEENT: OP clear, mucus membranes moist SKIN: warm, dry. No rashes. Neuro: No focal deficits Musculoskeletal: Muscle strength 5/5 all ext Psychiatric: Mood and affect normal Neck: No JVD, no carotid bruits, no thyromegaly, no lymphadenopathy. Lungs:Clear bilaterally, no wheezes, rhonci, crackles Cardiovascular: Regular rate and rhythm. No murmurs, gallops or rubs. Abdomen:Soft. Bowel sounds present. Non-tender.  Extremities: No lower extremity edema. Pulses are 2 + in the bilateral DP/PT.  EKG: NSR, rate 71 bpm.   Assessment and Plan:   1. Chest pain: She has had one episode of chest pain 6 weeks ago that was mostly atypical. No recurrence but she does have risk factors  for CAD including HTN, HLD, age and strong family history of premature CAD (brother MI 77s, Fathers MI 42s). Will arrange echo to assess LVEF and exclude structural heart disease and exercise treadmill stress test to exclude ischemia.

## 2013-02-18 ENCOUNTER — Ambulatory Visit (HOSPITAL_COMMUNITY): Payer: Medicare Other | Attending: Cardiovascular Disease | Admitting: Radiology

## 2013-02-18 DIAGNOSIS — R072 Precordial pain: Secondary | ICD-10-CM

## 2013-02-18 DIAGNOSIS — R079 Chest pain, unspecified: Secondary | ICD-10-CM | POA: Insufficient documentation

## 2013-02-18 NOTE — Progress Notes (Signed)
Echocardiogram performed.  

## 2013-03-10 ENCOUNTER — Encounter: Payer: Medicare Other | Admitting: Physician Assistant

## 2013-03-20 ENCOUNTER — Ambulatory Visit: Payer: Medicare Other | Admitting: Cardiovascular Disease

## 2013-03-20 ENCOUNTER — Encounter: Payer: Medicare Other | Admitting: Cardiovascular Disease

## 2013-03-26 ENCOUNTER — Telehealth: Payer: Self-pay

## 2013-03-26 MED ORDER — GALANTAMINE HYDROBROMIDE 12 MG PO TABS: 12.0000 mg | ORAL_TABLET | Freq: Two times a day (BID) | ORAL | Status: DC

## 2013-03-26 NOTE — Telephone Encounter (Signed)
Patient calls for refill on galantamine 12 mg sent to [facility] on [address]. She requested a savings card for Dexilant she was advised this was left a the front desk along with some samples.

## 2013-04-22 ENCOUNTER — Encounter: Payer: Self-pay | Admitting: Internal Medicine

## 2013-04-22 ENCOUNTER — Ambulatory Visit (INDEPENDENT_AMBULATORY_CARE_PROVIDER_SITE_OTHER): Payer: Medicare Other | Admitting: Internal Medicine

## 2013-04-22 VITALS — BP 142/90 | HR 80 | Temp 98.0°F | Resp 16 | Wt 196.0 lb

## 2013-04-22 DIAGNOSIS — I10 Essential (primary) hypertension: Secondary | ICD-10-CM

## 2013-04-22 DIAGNOSIS — F988 Other specified behavioral and emotional disorders with onset usually occurring in childhood and adolescence: Secondary | ICD-10-CM

## 2013-04-22 DIAGNOSIS — K219 Gastro-esophageal reflux disease without esophagitis: Secondary | ICD-10-CM

## 2013-04-22 DIAGNOSIS — F411 Generalized anxiety disorder: Secondary | ICD-10-CM

## 2013-04-22 MED ORDER — METHYLPHENIDATE HCL 5 MG PO TABS: 5.0000 mg | ORAL_TABLET | Freq: Two times a day (BID) | ORAL | Status: DC

## 2013-04-22 MED ORDER — TRAMADOL HCL 50 MG PO TABS: 50.0000 mg | ORAL_TABLET | Freq: Two times a day (BID) | ORAL | Status: DC | PRN

## 2013-04-22 NOTE — Assessment & Plan Note (Signed)
Continue with current prescription therapy as reflected on the Med list.  

## 2013-04-22 NOTE — Progress Notes (Signed)
   Subjective:    HPI   The patient is here to follow up on chronic memory and focus issues, depression, anxiety, headaches and chronic grief reaction   Ritalin - very rare prn  No CP - s/p Card consult  Review of Systems  Constitutional: Negative for chills, activity change, appetite change, fatigue and unexpected weight change.  HENT: Negative for congestion and mouth sores.   Eyes: Negative for visual disturbance.  Respiratory: Negative for chest tightness.   Genitourinary: Negative for difficulty urinating and vaginal pain.  Musculoskeletal: Negative for gait problem.  Skin: Negative for pallor and rash.  Neurological: Negative for tremors.  Psychiatric/Behavioral: Positive for suicidal ideas. Negative for confusion and sleep disturbance. The patient is nervous/anxious.    Wt Readings from Last 3 Encounters:  04/22/13 196 lb (88.905 kg)  02/13/13 191 lb 3.2 oz (86.728 kg)  12/27/12 190 lb (86.183 kg)   BP Readings from Last 3 Encounters:  04/22/13 142/90  02/13/13 138/92  12/27/12 130/90       Objective:   Physical Exam  Constitutional: She appears well-developed and well-nourished. No distress.  HENT:  Head: Normocephalic.  Right Ear: External ear normal.  Left Ear: External ear normal.  Nose: Nose normal.  Mouth/Throat: Oropharynx is clear and moist.  Eyes: Conjunctivae are normal. Pupils are equal, round, and reactive to light. Right eye exhibits no discharge. Left eye exhibits no discharge.  Neck: Normal range of motion. Neck supple. No JVD present. No tracheal deviation present. No thyromegaly present.  Cardiovascular: Normal rate, regular rhythm and normal heart sounds.   Pulmonary/Chest: No stridor. No respiratory distress. She has no wheezes.  Abdominal: Soft. Bowel sounds are normal. She exhibits no distension and no mass. There is no tenderness. There is no rebound and no guarding.  Musculoskeletal: She exhibits no edema and no tenderness.   Lymphadenopathy:    She has no cervical adenopathy.  Neurological: She displays normal reflexes. No cranial nerve deficit. She exhibits normal muscle tone. Coordination normal.  Skin: No rash noted. No erythema.  Psychiatric: She has a normal mood and affect. Her behavior is normal. Judgment and thought content normal.      Lab Results  Component Value Date   WBC 6.1 10/14/2012   HGB 12.2 10/14/2012   HCT 36.8 10/14/2012   PLT 264.0 10/14/2012   CHOL 218* 10/14/2012   TRIG 89.0 10/14/2012   HDL 57.70 10/14/2012   LDLDIRECT 152.9 10/14/2012   ALT 15 12/27/2012   AST 20 12/27/2012   NA 139 12/27/2012   K 3.9 12/27/2012   CL 105 12/27/2012   CREATININE 0.7 12/27/2012   BUN 14 12/27/2012   CO2 29 12/27/2012   TSH 1.31 04/25/2011       Assessment & Plan:

## 2013-04-22 NOTE — Assessment & Plan Note (Signed)
BP is nl at home 

## 2013-06-11 ENCOUNTER — Other Ambulatory Visit: Payer: Self-pay

## 2013-07-03 ENCOUNTER — Other Ambulatory Visit: Payer: Self-pay | Admitting: Internal Medicine

## 2013-07-03 NOTE — Telephone Encounter (Signed)
Spoke to pharmacy, they did not receive last rx sent into pharmacy. Re-sent rx.

## 2013-08-26 ENCOUNTER — Ambulatory Visit (INDEPENDENT_AMBULATORY_CARE_PROVIDER_SITE_OTHER): Payer: Medicare Other | Admitting: Internal Medicine

## 2013-08-26 ENCOUNTER — Encounter: Payer: Self-pay | Admitting: Internal Medicine

## 2013-08-26 VITALS — BP 130/70 | HR 72 | Temp 98.2°F | Resp 16 | Wt 194.0 lb

## 2013-08-26 DIAGNOSIS — I10 Essential (primary) hypertension: Secondary | ICD-10-CM

## 2013-08-26 DIAGNOSIS — R109 Unspecified abdominal pain: Secondary | ICD-10-CM

## 2013-08-26 DIAGNOSIS — F411 Generalized anxiety disorder: Secondary | ICD-10-CM

## 2013-08-26 DIAGNOSIS — K219 Gastro-esophageal reflux disease without esophagitis: Secondary | ICD-10-CM

## 2013-08-26 MED ORDER — MELOXICAM 15 MG PO TABS: 15.0000 mg | ORAL_TABLET | Freq: Every day | ORAL | Status: DC | PRN

## 2013-08-26 NOTE — Assessment & Plan Note (Signed)
Continue with current prn prescription therapy as reflected on the Med list.  

## 2013-08-26 NOTE — Assessment & Plan Note (Signed)
Continue with current prescription prn therapy as reflected on the Med list.  

## 2013-08-26 NOTE — Assessment & Plan Note (Signed)
Continue with current prescription therapy as reflected on the Med list.  

## 2013-08-26 NOTE — Progress Notes (Signed)
   Subjective:    HPI   The patient is here to follow up on chronic memory and focus issues, depression, anxiety, headaches and chronic grief reaction. She had a pelvic US and CA-125 - w/Dr McComb q 6 mo  Pt had abd pain relieved by Mobic   Ritalin - very rare prn  No CP - s/p Card consult  Review of Systems  Constitutional: Negative for chills, activity change, appetite change, fatigue and unexpected weight change.  HENT: Negative for congestion and mouth sores.   Eyes: Negative for visual disturbance.  Respiratory: Negative for chest tightness.   Genitourinary: Negative for difficulty urinating and vaginal pain.  Musculoskeletal: Negative for gait problem.  Skin: Negative for pallor and rash.  Neurological: Negative for tremors.  Psychiatric/Behavioral: Positive for suicidal ideas. Negative for confusion and sleep disturbance. The patient is nervous/anxious.    Wt Readings from Last 3 Encounters:  08/26/13 194 lb (87.998 kg)  04/22/13 196 lb (88.905 kg)  02/13/13 191 lb 3.2 oz (86.728 kg)   BP Readings from Last 3 Encounters:  08/26/13 130/70  04/22/13 142/90  02/13/13 138/92       Objective:   Physical Exam  Constitutional: She appears well-developed and well-nourished. No distress.  HENT:  Head: Normocephalic.  Right Ear: External ear normal.  Left Ear: External ear normal.  Nose: Nose normal.  Mouth/Throat: Oropharynx is clear and moist.  Eyes: Conjunctivae are normal. Pupils are equal, round, and reactive to light. Right eye exhibits no discharge. Left eye exhibits no discharge.  Neck: Normal range of motion. Neck supple. No JVD present. No tracheal deviation present. No thyromegaly present.  Cardiovascular: Normal rate, regular rhythm and normal heart sounds.   Pulmonary/Chest: No stridor. No respiratory distress. She has no wheezes.  Abdominal: Soft. Bowel sounds are normal. She exhibits no distension and no mass. There is no tenderness. There is no rebound  and no guarding.  Musculoskeletal: She exhibits no edema and no tenderness.  Lymphadenopathy:    She has no cervical adenopathy.  Neurological: She displays normal reflexes. No cranial nerve deficit. She exhibits normal muscle tone. Coordination normal.  Skin: No rash noted. No erythema.  Psychiatric: She has a normal mood and affect. Her behavior is normal. Judgment and thought content normal.      Lab Results  Component Value Date   WBC 6.1 10/14/2012   HGB 12.2 10/14/2012   HCT 36.8 10/14/2012   PLT 264.0 10/14/2012   CHOL 218* 10/14/2012   TRIG 89.0 10/14/2012   HDL 57.70 10/14/2012   LDLDIRECT 152.9 10/14/2012   ALT 15 12/27/2012   AST 20 12/27/2012   NA 139 12/27/2012   K 3.9 12/27/2012   CL 105 12/27/2012   CREATININE 0.7 12/27/2012   BUN 14 12/27/2012   CO2 29 12/27/2012   TSH 1.31 04/25/2011       Assessment & Plan:

## 2013-08-26 NOTE — Patient Instructions (Signed)
Gluten free trial (no wheat products) for 4-6 weeks. OK to use gluten-free bread and gluten-free pasta.     

## 2013-08-26 NOTE — Assessment & Plan Note (Addendum)
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 Normally the pain would resolve after 2-6 hrs S/p full GI w/up in 2012 Dr Loreta Ave; colon pending Meloxicam prn Gluten free trial (no wheat products) for 4-6 weeks. OK to use gluten-free bread and gluten-free pasta.

## 2013-09-11 ENCOUNTER — Other Ambulatory Visit: Payer: Self-pay | Admitting: *Deleted

## 2013-09-11 MED ORDER — HYDROCHLOROTHIAZIDE 12.5 MG PO CAPS: 12.5000 mg | ORAL_CAPSULE | Freq: Every day | ORAL | Status: DC

## 2013-11-19 ENCOUNTER — Other Ambulatory Visit: Payer: Self-pay | Admitting: Internal Medicine

## 2014-01-09 ENCOUNTER — Ambulatory Visit (INDEPENDENT_AMBULATORY_CARE_PROVIDER_SITE_OTHER): Payer: Medicare Other | Admitting: Internal Medicine

## 2014-01-09 ENCOUNTER — Encounter: Payer: Self-pay | Admitting: Internal Medicine

## 2014-01-09 ENCOUNTER — Other Ambulatory Visit (INDEPENDENT_AMBULATORY_CARE_PROVIDER_SITE_OTHER): Payer: Medicare Other

## 2014-01-09 VITALS — BP 130/88 | HR 80 | Temp 98.5°F | Resp 16 | Wt 194.0 lb

## 2014-01-09 DIAGNOSIS — R5383 Other fatigue: Secondary | ICD-10-CM

## 2014-01-09 DIAGNOSIS — I10 Essential (primary) hypertension: Secondary | ICD-10-CM

## 2014-01-09 DIAGNOSIS — R5381 Other malaise: Secondary | ICD-10-CM

## 2014-01-09 DIAGNOSIS — R209 Unspecified disturbances of skin sensation: Secondary | ICD-10-CM

## 2014-01-09 DIAGNOSIS — R5382 Chronic fatigue, unspecified: Secondary | ICD-10-CM

## 2014-01-09 DIAGNOSIS — R202 Paresthesia of skin: Secondary | ICD-10-CM

## 2014-01-09 LAB — CBC WITH DIFFERENTIAL/PLATELET
Basophils Absolute: 0 10*3/uL (ref 0.0–0.1)
Basophils Relative: 0.5 % (ref 0.0–3.0)
Eosinophils Absolute: 0.1 10*3/uL (ref 0.0–0.7)
Eosinophils Relative: 1.9 % (ref 0.0–5.0)
HCT: 37.6 % (ref 36.0–46.0)
Hemoglobin: 12.5 g/dL (ref 12.0–15.0)
Lymphocytes Relative: 30.6 % (ref 12.0–46.0)
Lymphs Abs: 1.7 10*3/uL (ref 0.7–4.0)
MCHC: 33.3 g/dL (ref 30.0–36.0)
MCV: 85 fl (ref 78.0–100.0)
Monocytes Absolute: 0.3 10*3/uL (ref 0.1–1.0)
Monocytes Relative: 5.7 % (ref 3.0–12.0)
Neutro Abs: 3.4 10*3/uL (ref 1.4–7.7)
Neutrophils Relative %: 61.3 % (ref 43.0–77.0)
Platelets: 270 10*3/uL (ref 150.0–400.0)
RBC: 4.43 Mil/uL (ref 3.87–5.11)
RDW: 13.9 % (ref 11.5–14.6)
WBC: 5.5 10*3/uL (ref 4.5–10.5)

## 2014-01-09 LAB — BASIC METABOLIC PANEL
BUN: 15 mg/dL (ref 6–23)
CO2: 29 mEq/L (ref 19–32)
Calcium: 10 mg/dL (ref 8.4–10.5)
Chloride: 104 mEq/L (ref 96–112)
Creatinine, Ser: 0.8 mg/dL (ref 0.4–1.2)
GFR: 96.92 mL/min (ref >60.00)
Glucose, Bld: 83 mg/dL (ref 70–99)
Potassium: 3.6 mEq/L (ref 3.5–5.1)
Sodium: 141 mEq/L (ref 135–145)

## 2014-01-09 LAB — HEPATIC FUNCTION PANEL
ALT: 18 U/L (ref 0–35)
AST: 22 U/L (ref 0–37)
Albumin: 4.1 g/dL (ref 3.5–5.2)
Alkaline Phosphatase: 108 U/L (ref 39–117)
Bilirubin, Direct: 0.1 mg/dL (ref 0.0–0.3)
Total Bilirubin: 0.9 mg/dL (ref 0.3–1.2)
Total Protein: 7.7 g/dL (ref 6.0–8.3)

## 2014-01-09 LAB — URINALYSIS
Bilirubin Urine: NEGATIVE
Hgb urine dipstick: NEGATIVE
Ketones, ur: NEGATIVE
Leukocytes, UA: NEGATIVE
Nitrite: NEGATIVE
Specific Gravity, Urine: 1.025 (ref 1.000–1.030)
Total Protein, Urine: NEGATIVE
Urine Glucose: NEGATIVE
Urobilinogen, UA: 0.2 (ref 0.0–1.0)
pH: 6 (ref 5.0–8.0)

## 2014-01-09 LAB — VITAMIN B12: Vitamin B-12: 1038 pg/mL — ABNORMAL HIGH (ref 211–911)

## 2014-01-09 LAB — TSH: TSH: 2 u[IU]/mL (ref 0.35–5.50)

## 2014-01-09 NOTE — Progress Notes (Deleted)
Pre visit review using our clinic review tool, if applicable. No additional management support is needed unless otherwise documented below in the visit note. 

## 2014-01-09 NOTE — Assessment & Plan Note (Signed)
Was doing well prior to 2 wks ago - ?post-viral vs other Labs

## 2014-01-09 NOTE — Assessment & Plan Note (Signed)
Continue with current prescription therapy as reflected on the Med list.  

## 2014-01-09 NOTE — Assessment & Plan Note (Signed)
?   etiology 

## 2014-01-09 NOTE — Progress Notes (Signed)
   Subjective:     HPI   C/o fatigue x 3 wks  Aches and pain are worse Not sleeping well Had a nl colon 2 mo ago  The patient is here to follow up on chronic memory and focus issues, depression, anxiety, headaches and chronic grief reaction. She had a pelvic US and CA-125 - w/Dr McComb q 6 mo  Pt had abd pain relieved by Mobic   Ritalin - very rare prn  No CP - s/p Card consult  Wt Readings from Last 3 Encounters:  01/09/14 194 lb (87.998 kg)  08/26/13 194 lb (87.998 kg)  04/22/13 196 lb (88.905 kg)   BP Readings from Last 3 Encounters:  01/09/14 130/88  08/26/13 130/70  04/22/13 142/90      Review of Systems  Constitutional: Positive for fatigue. Negative for chills, activity change, appetite change and unexpected weight change.  HENT: Negative for congestion, mouth sores and sinus pressure.   Eyes: Negative for visual disturbance.  Respiratory: Negative for cough and chest tightness.   Gastrointestinal: Negative for nausea and abdominal pain.  Genitourinary: Negative for frequency, difficulty urinating and vaginal pain.  Musculoskeletal: Negative for back pain and gait problem.  Skin: Negative for pallor and rash.  Neurological: Negative for dizziness, tremors, weakness, numbness and headaches.  Psychiatric/Behavioral: Positive for sleep disturbance. Negative for suicidal ideas and confusion. The patient is nervous/anxious.        Objective:   Physical Exam  Constitutional: She appears well-developed. No distress.  HENT:  Head: Normocephalic.  Right Ear: External ear normal.  Left Ear: External ear normal.  Nose: Nose normal.  Mouth/Throat: Oropharynx is clear and moist.  Eyes: Conjunctivae are normal. Pupils are equal, round, and reactive to light. Right eye exhibits no discharge. Left eye exhibits no discharge.  Neck: Normal range of motion. Neck supple. No JVD present. No tracheal deviation present. No thyromegaly present.  Cardiovascular: Normal rate,  regular rhythm and normal heart sounds.   Pulmonary/Chest: No stridor. No respiratory distress. She has no wheezes.  Abdominal: Soft. Bowel sounds are normal. She exhibits no distension and no mass. There is no tenderness. There is no rebound and no guarding.  Musculoskeletal: She exhibits no edema and no tenderness.  Lymphadenopathy:    She has no cervical adenopathy.  Neurological: She displays normal reflexes. No cranial nerve deficit. She exhibits normal muscle tone. Coordination normal.  Skin: No rash noted. No erythema.  Psychiatric: Her behavior is normal. Judgment and thought content normal.          Assessment & Plan:

## 2014-01-13 ENCOUNTER — Other Ambulatory Visit: Payer: Self-pay

## 2014-01-13 MED ORDER — GALANTAMINE HYDROBROMIDE 12 MG PO TABS: 12.0000 mg | ORAL_TABLET | Freq: Two times a day (BID) | ORAL | Status: DC

## 2014-01-13 NOTE — Telephone Encounter (Signed)
Received refill request from [facility] request refills for galantamine hbr 12 mg . Rx last written 03/26/2013 and pt last seen 01/09/2014.

## 2014-01-14 ENCOUNTER — Telehealth: Payer: Self-pay | Admitting: *Deleted

## 2014-01-14 NOTE — Telephone Encounter (Signed)
Patient phoned requesting recent test results.  Per EMR review, recent labs were normal.  Notified patient

## 2014-02-24 ENCOUNTER — Ambulatory Visit: Payer: Medicare Other | Admitting: Internal Medicine

## 2014-02-27 ENCOUNTER — Other Ambulatory Visit: Payer: Self-pay | Admitting: Internal Medicine

## 2014-04-04 ENCOUNTER — Other Ambulatory Visit: Payer: Self-pay | Admitting: Internal Medicine

## 2014-04-14 ENCOUNTER — Telehealth: Payer: Self-pay | Admitting: *Deleted

## 2014-04-14 NOTE — Telephone Encounter (Signed)
Pt called requesting an excuse from Jury Duty due to her medical ailments.  She is requesting excuse be faxed to [phone].  Please advise

## 2014-04-15 NOTE — Telephone Encounter (Signed)
Left message on VM advising Juror letter needed

## 2014-04-15 NOTE — Telephone Encounter (Signed)
We need her juror letter Thx

## 2014-04-17 ENCOUNTER — Telehealth: Payer: Self-pay | Admitting: Internal Medicine

## 2014-04-17 NOTE — Telephone Encounter (Signed)
Alex's patient 

## 2014-04-17 NOTE — Telephone Encounter (Signed)
[place]  Patient is requesting letter stating can not attend due to health issues.  Please call once complete.

## 2014-04-17 NOTE — Telephone Encounter (Signed)
We need her jury letter Thx

## 2014-04-20 NOTE — Telephone Encounter (Signed)
Called pt to notify that we need her juror letter. No answer, unable to leave VM on mobile/home numbers.

## 2014-04-29 ENCOUNTER — Encounter: Payer: Self-pay | Admitting: Internal Medicine

## 2014-04-29 ENCOUNTER — Telehealth: Payer: Self-pay

## 2014-04-29 NOTE — Telephone Encounter (Signed)
Letter printed. Awaiting signature.

## 2014-04-29 NOTE — Telephone Encounter (Signed)
Pt call back, juror # is [phone].

## 2014-04-29 NOTE — Telephone Encounter (Signed)
(  see previous phone note) Spoke with patient to advised that a copy of her jury letter has been requested per MD. Patient states that she received that massage and dropped of requested letter over a week ago. She states that this letter is needed ASAP due to court date in ten days.

## 2014-04-29 NOTE — Telephone Encounter (Signed)
Patient called [employer] stating that she has not heard back about jury letter request. I returned call to [phone], lmovm advising of below phone note. Advise pt to drop off jury letter as requested by MD.

## 2014-04-29 NOTE — Telephone Encounter (Signed)
I do not have a copy of her jury letter. Letter can still be generated. We do need juror number though.

## 2014-04-29 NOTE — Telephone Encounter (Signed)
Pt notified and will call back with juror number

## 2014-04-30 NOTE — Telephone Encounter (Signed)
Signed and placed up front for pick up.  

## 2014-10-20 ENCOUNTER — Encounter: Payer: Self-pay | Admitting: Internal Medicine

## 2014-10-20 ENCOUNTER — Ambulatory Visit (INDEPENDENT_AMBULATORY_CARE_PROVIDER_SITE_OTHER): Payer: Medicare Other | Admitting: Internal Medicine

## 2014-10-20 VITALS — BP 130/84 | HR 79 | Temp 98.2°F | Wt 194.0 lb

## 2014-10-20 DIAGNOSIS — F988 Other specified behavioral and emotional disorders with onset usually occurring in childhood and adolescence: Secondary | ICD-10-CM

## 2014-10-20 DIAGNOSIS — F411 Generalized anxiety disorder: Secondary | ICD-10-CM

## 2014-10-20 DIAGNOSIS — I10 Essential (primary) hypertension: Secondary | ICD-10-CM

## 2014-10-20 DIAGNOSIS — R5383 Other fatigue: Secondary | ICD-10-CM

## 2014-10-20 DIAGNOSIS — F909 Attention-deficit hyperactivity disorder, unspecified type: Secondary | ICD-10-CM

## 2014-10-20 MED ORDER — GALANTAMINE HYDROBROMIDE 12 MG PO TABS: 12.0000 mg | ORAL_TABLET | Freq: Two times a day (BID) | ORAL | Status: DC

## 2014-10-20 MED ORDER — MELOXICAM 15 MG PO TABS: 15.0000 mg | ORAL_TABLET | Freq: Every day | ORAL | Status: DC | PRN

## 2014-10-20 MED ORDER — ALPRAZOLAM 0.5 MG PO TABS: ORAL_TABLET | ORAL | Status: DC

## 2014-10-20 MED ORDER — TRAMADOL HCL 50 MG PO TABS: ORAL_TABLET | ORAL | Status: DC

## 2014-10-20 NOTE — Assessment & Plan Note (Signed)
Continue with current prescription therapy as reflected on the Med list.  

## 2014-10-20 NOTE — Assessment & Plan Note (Signed)
Continue with current prescription therapy as reflected on the Med list. BP Readings from Last 3 Encounters:  10/20/14 130/84  01/09/14 130/88  08/26/13 130/70

## 2014-10-20 NOTE — Assessment & Plan Note (Signed)
Resolved

## 2014-10-20 NOTE — Assessment & Plan Note (Signed)
Discussed.

## 2014-10-20 NOTE — Progress Notes (Signed)
Pre visit review using our clinic review tool, if applicable. No additional management support is needed unless otherwise documented below in the visit note. 

## 2014-10-20 NOTE — Progress Notes (Signed)
   Subjective:     HPI   F/u fatigue - resolved Aches and pain are worse Not sleeping well Had a nl colon 2 mo ago  The patient is here to follow up on chronic memory and focus issues, depression, anxiety, headaches and chronic grief reaction. She had a pelvic US and CA-125 - w/Dr McComb q 6 mo  Pt had abd pain relieved by Mobic   Ritalin - very rare prn  No CP - s/p Card consult  Wt Readings from Last 3 Encounters:  10/20/14 194 lb (87.998 kg)  01/09/14 194 lb (87.998 kg)  08/26/13 194 lb (87.998 kg)   BP Readings from Last 3 Encounters:  10/20/14 130/84  01/09/14 130/88  08/26/13 130/70      Review of Systems  Constitutional: Positive for fatigue. Negative for chills, activity change, appetite change and unexpected weight change.  HENT: Negative for congestion, mouth sores and sinus pressure.   Eyes: Negative for visual disturbance.  Respiratory: Negative for cough and chest tightness.   Gastrointestinal: Negative for nausea and abdominal pain.  Genitourinary: Negative for frequency, difficulty urinating and vaginal pain.  Musculoskeletal: Negative for back pain and gait problem.  Skin: Negative for pallor and rash.  Neurological: Negative for dizziness, tremors, weakness, numbness and headaches.  Psychiatric/Behavioral: Positive for sleep disturbance. Negative for suicidal ideas and confusion. The patient is nervous/anxious.        Objective:   Physical Exam  Constitutional: She appears well-developed. No distress.  HENT:  Head: Normocephalic.  Right Ear: External ear normal.  Left Ear: External ear normal.  Nose: Nose normal.  Mouth/Throat: Oropharynx is clear and moist.  Eyes: Conjunctivae are normal. Pupils are equal, round, and reactive to light. Right eye exhibits no discharge. Left eye exhibits no discharge.  Neck: Normal range of motion. Neck supple. No JVD present. No tracheal deviation present. No thyromegaly present.  Cardiovascular: Normal  rate, regular rhythm and normal heart sounds.   Pulmonary/Chest: No stridor. No respiratory distress. She has no wheezes.  Abdominal: Soft. Bowel sounds are normal. She exhibits no distension and no mass. There is no tenderness. There is no rebound and no guarding.  Musculoskeletal: She exhibits no edema or tenderness.  Lymphadenopathy:    She has no cervical adenopathy.  Neurological: She displays normal reflexes. No cranial nerve deficit. She exhibits normal muscle tone. Coordination normal.  Skin: No rash noted. No erythema.  Psychiatric: She has a normal mood and affect. Her behavior is normal. Judgment and thought content normal.          Assessment & Plan:  Patient ID: Shelley Bray, female   DOB: [birth year], 68 y.o.   MRN: [id number]

## 2014-10-21 ENCOUNTER — Telehealth: Payer: Self-pay | Admitting: Internal Medicine

## 2014-10-21 NOTE — Telephone Encounter (Signed)
emmi emailed °

## 2014-11-05 ENCOUNTER — Other Ambulatory Visit: Payer: Self-pay | Admitting: Internal Medicine

## 2014-11-06 LAB — HM MAMMOGRAPHY

## 2014-11-12 NOTE — Telephone Encounter (Signed)
Called pharmacy spoke with Laney Potashana gave md approval.../lmb

## 2015-01-20 ENCOUNTER — Telehealth: Payer: Self-pay | Admitting: *Deleted

## 2015-01-20 NOTE — Telephone Encounter (Signed)
[facility] - Client TELEPHONE ADVICE RECORD [facility] Patient Name: Shelley CruiseBRENDA Danek Gender: Female DOB: [birth year] Age: [age] Y 5 M 12 D Return Phone Number: [phone] (Primary) Address: City/State/Zip:  [facility] Client Site [facility] Night Physician Plotnikov, Alex Contact Type Call Caller Name pt Caller Phone Number verified Relationship To Patient Self Is this call to report lab results? No Call Type General Information Initial Comment Caller wanted to leave a message. Asked for the fax # to send her refill request. Advised to cb on Monday to make sure her msge is reviewed General Information Type Other Nurse Assessment Guidelines Guideline Title Affirmed Question Affirmed Notes Nurse Date/Time (Eastern Time) Disp. Time Lamount Cohen(Eastern Time) Disposition Final User 01/16/2015 9:08:21 AM General Information Provided Yes Einar PheasantNoriega, Rosie After Care Instructions Given Call Event Type User Date / Time Description

## 2015-02-09 ENCOUNTER — Other Ambulatory Visit: Payer: Self-pay | Admitting: *Deleted

## 2015-02-09 NOTE — Telephone Encounter (Signed)
Pt is requesting Rxs for [facility]:  [address] Vit D3 2000 IU qd Miralax prn Lysine prn And Vitamin C qd

## 2015-02-10 ENCOUNTER — Other Ambulatory Visit: Payer: Self-pay | Admitting: Internal Medicine

## 2015-02-10 MED ORDER — ULTRAFLORA IMMUNE HEALTH 170 MG PO CAPS: ORAL_CAPSULE | ORAL | Status: DC

## 2015-02-10 MED ORDER — LYSINE 500 MG PO CAPS: ORAL_CAPSULE | ORAL | Status: DC

## 2015-02-10 MED ORDER — VITAMIN C 100 MG PO TABS: 100.0000 mg | ORAL_TABLET | Freq: Two times a day (BID) | ORAL | Status: DC

## 2015-02-10 MED ORDER — VITAMIN D3 50 MCG (2000 UT) PO CAPS: 2000.0000 [IU] | ORAL_CAPSULE | Freq: Every day | ORAL | Status: DC

## 2015-02-10 MED ORDER — POLYETHYLENE GLYCOL 3350 17 GM/SCOOP PO POWD: 17.0000 g | Freq: Two times a day (BID) | ORAL | Status: DC | PRN

## 2015-02-10 NOTE — Telephone Encounter (Signed)
Done. Thx.

## 2015-02-11 ENCOUNTER — Other Ambulatory Visit: Payer: Self-pay

## 2015-02-11 MED ORDER — POLYETHYLENE GLYCOL 3350 17 GM/SCOOP PO POWD: 17.0000 g | Freq: Two times a day (BID) | ORAL | Status: DC | PRN

## 2015-02-11 MED ORDER — VITAMIN D3 50 MCG (2000 UT) PO CAPS: 2000.0000 [IU] | ORAL_CAPSULE | Freq: Every day | ORAL | Status: AC

## 2015-02-11 MED ORDER — VITAMIN C 250 MG PO TABS: 250.0000 mg | ORAL_TABLET | Freq: Two times a day (BID) | ORAL | Status: DC

## 2015-02-11 MED ORDER — LYSINE 500 MG PO CAPS: ORAL_CAPSULE | ORAL | Status: DC

## 2015-02-11 MED ORDER — VITAMIN C 100 MG PO TABS
100.0000 mg | ORAL_TABLET | Freq: Two times a day (BID) | ORAL | Status: DC
Start: 2015-02-11 — End: 2015-02-11

## 2015-02-11 MED ORDER — ULTRAFLORA IMMUNE HEALTH 170 MG PO CAPS: ORAL_CAPSULE | ORAL | Status: DC

## 2015-02-11 NOTE — Telephone Encounter (Signed)
Pt informed rx has been sent.  

## 2015-02-11 NOTE — Addendum Note (Signed)
Addended by: Merrilyn PumaSIMMONS, Linea Calles N on: 02/11/2015 05:00 PM   Modules accepted: Orders

## 2015-02-18 ENCOUNTER — Encounter: Payer: Self-pay | Admitting: Internal Medicine

## 2015-02-18 ENCOUNTER — Other Ambulatory Visit (INDEPENDENT_AMBULATORY_CARE_PROVIDER_SITE_OTHER): Payer: Medicare Other

## 2015-02-18 ENCOUNTER — Ambulatory Visit (INDEPENDENT_AMBULATORY_CARE_PROVIDER_SITE_OTHER): Payer: Medicare Other | Admitting: Internal Medicine

## 2015-02-18 VITALS — BP 118/78 | HR 83 | Wt 192.0 lb

## 2015-02-18 DIAGNOSIS — I10 Essential (primary) hypertension: Secondary | ICD-10-CM | POA: Diagnosis not present

## 2015-02-18 DIAGNOSIS — E785 Hyperlipidemia, unspecified: Secondary | ICD-10-CM

## 2015-02-18 DIAGNOSIS — R5383 Other fatigue: Secondary | ICD-10-CM

## 2015-02-18 DIAGNOSIS — R5382 Chronic fatigue, unspecified: Secondary | ICD-10-CM

## 2015-02-18 DIAGNOSIS — F909 Attention-deficit hyperactivity disorder, unspecified type: Secondary | ICD-10-CM | POA: Diagnosis not present

## 2015-02-18 DIAGNOSIS — F988 Other specified behavioral and emotional disorders with onset usually occurring in childhood and adolescence: Secondary | ICD-10-CM

## 2015-02-18 LAB — BASIC METABOLIC PANEL
BUN: 13 mg/dL (ref 6–23)
CO2: 30 mEq/L (ref 19–32)
Calcium: 10.3 mg/dL (ref 8.4–10.5)
Chloride: 103 mEq/L (ref 96–112)
Creatinine, Ser: 0.86 mg/dL (ref 0.40–1.20)
GFR: 83.76 mL/min (ref >60.00)
Glucose, Bld: 88 mg/dL (ref 70–99)
Potassium: 3.9 mEq/L (ref 3.5–5.1)
Sodium: 140 mEq/L (ref 135–145)

## 2015-02-18 LAB — TSH: TSH: 2.48 u[IU]/mL (ref 0.35–4.50)

## 2015-02-18 LAB — LIPID PANEL
Cholesterol: 253 mg/dL — ABNORMAL HIGH (ref 0–200)
HDL: 63 mg/dL (ref >39.00)
LDL Cholesterol: 167 mg/dL — ABNORMAL HIGH (ref 0–99)
NonHDL: 190
Total CHOL/HDL Ratio: 4
Triglycerides: 117 mg/dL (ref 0.0–149.0)
VLDL: 23.4 mg/dL (ref 0.0–40.0)

## 2015-02-18 MED ORDER — GALANTAMINE HYDROBROMIDE 12 MG PO TABS: 12.0000 mg | ORAL_TABLET | Freq: Two times a day (BID) | ORAL | Status: DC

## 2015-02-18 NOTE — Progress Notes (Signed)
Pre visit review using our clinic review tool, if applicable. No additional management support is needed unless otherwise documented below in the visit note. 

## 2015-02-18 NOTE — Assessment & Plan Note (Signed)
Labs

## 2015-02-18 NOTE — Assessment & Plan Note (Signed)
Doing well 

## 2015-02-18 NOTE — Assessment & Plan Note (Signed)
HCTZ 

## 2015-02-18 NOTE — Assessment & Plan Note (Signed)
Ritalin

## 2015-02-18 NOTE — Assessment & Plan Note (Signed)
Chronic.  Discussed. 

## 2015-02-18 NOTE — Progress Notes (Signed)
Subjective:     HPI   F/u fatigue - ok Aches and pain are worse Not sleeping well Had a nl colon 2 mo ago  The patient is here to follow up on chronic memory and focus issues, depression, anxiety, headaches and chronic grief reaction. She had a pelvic US and CA-125 - w/Dr McComb q 6 mo  Pt had abd pain relieved by Mobic   Ritalin - very rare prn  No CP - s/p Card consult  Wt Readings from Last 3 Encounters:  10/20/14 194 lb (87.998 kg)  01/09/14 194 lb (87.998 kg)  08/26/13 194 lb (87.998 kg)   BP Readings from Last 3 Encounters:  10/20/14 130/84  01/09/14 130/88  08/26/13 130/70      Review of Systems  Constitutional: Positive for fatigue. Negative for chills, activity change, appetite change and unexpected weight change.  HENT: Negative for congestion, mouth sores and sinus pressure.   Eyes: Negative for visual disturbance.  Respiratory: Negative for cough and chest tightness.   Gastrointestinal: Negative for nausea and abdominal pain.  Genitourinary: Negative for frequency, difficulty urinating and vaginal pain.  Musculoskeletal: Negative for back pain and gait problem.  Skin: Negative for pallor and rash.  Neurological: Negative for dizziness, tremors, weakness, numbness and headaches.  Psychiatric/Behavioral: Positive for sleep disturbance. Negative for suicidal ideas and confusion. The patient is nervous/anxious.        Objective:   Physical Exam  Constitutional: She appears well-developed. No distress.  HENT:  Head: Normocephalic.  Right Ear: External ear normal.  Left Ear: External ear normal.  Nose: Nose normal.  Mouth/Throat: Oropharynx is clear and moist.  Eyes: Conjunctivae are normal. Pupils are equal, round, and reactive to light. Right eye exhibits no discharge. Left eye exhibits no discharge.  Neck: Normal range of motion. Neck supple. No JVD present. No tracheal deviation present. No thyromegaly present.  Cardiovascular: Normal rate,  regular rhythm and normal heart sounds.   Pulmonary/Chest: No stridor. No respiratory distress. She has no wheezes.  Abdominal: Soft. Bowel sounds are normal. She exhibits no distension and no mass. There is no tenderness. There is no rebound and no guarding.  Musculoskeletal: She exhibits no edema or tenderness.  Lymphadenopathy:    She has no cervical adenopathy.  Neurological: She displays normal reflexes. No cranial nerve deficit. She exhibits normal muscle tone. Coordination normal.  Skin: No rash noted. No erythema.  Psychiatric: She has a normal mood and affect. Her behavior is normal. Judgment and thought content normal.    Lab Results  Component Value Date   WBC 5.5 01/09/2014   HGB 12.5 01/09/2014   HCT 37.6 01/09/2014   PLT 270.0 01/09/2014   GLUCOSE 83 01/09/2014   CHOL 218* 10/14/2012   TRIG 89.0 10/14/2012   HDL 57.70 10/14/2012   LDLDIRECT 152.9 10/14/2012   LDLCALC  07/06/2007    80        Total Cholesterol/HDL:CHD Risk Coronary Heart Disease Risk Table                     Men   Women  1/2 Average Risk   3.4   3.3   ALT 18 01/09/2014   AST 22 01/09/2014   NA 141 01/09/2014   K 3.6 01/09/2014   CL 104 01/09/2014   CREATININE 0.8 01/09/2014   BUN 15 01/09/2014   CO2 29 01/09/2014   TSH 2.00 01/09/2014         Assessment & Plan:

## 2015-02-18 NOTE — Patient Instructions (Signed)
[place] 

## 2015-02-23 ENCOUNTER — Telehealth: Payer: Self-pay | Admitting: Internal Medicine

## 2015-02-23 NOTE — Telephone Encounter (Signed)
Called patient to make sure fax number was correct, and to also ask if ok to fax over to make sure no one else would see this info, pt stated this was her personal fax machine, i have faxed lab results over

## 2015-02-23 NOTE — Telephone Encounter (Signed)
Requesting lab results to be faxed to [phone].

## 2015-04-21 ENCOUNTER — Ambulatory Visit: Payer: Medicare Other | Admitting: Internal Medicine

## 2015-04-27 ENCOUNTER — Encounter: Payer: Self-pay | Admitting: *Deleted

## 2015-06-11 ENCOUNTER — Other Ambulatory Visit: Payer: Self-pay | Admitting: Internal Medicine

## 2015-06-11 NOTE — Telephone Encounter (Signed)
Please advise, thanks.

## 2015-06-14 NOTE — Telephone Encounter (Signed)
Xanax rx called in to [facility]

## 2015-06-24 ENCOUNTER — Encounter: Payer: Self-pay | Admitting: Family

## 2015-06-24 ENCOUNTER — Ambulatory Visit (INDEPENDENT_AMBULATORY_CARE_PROVIDER_SITE_OTHER): Payer: Medicare Other | Admitting: Family

## 2015-06-24 VITALS — BP 130/88 | HR 86 | Temp 98.4°F | Resp 18 | Ht 66.0 in | Wt 195.0 lb

## 2015-06-24 DIAGNOSIS — J069 Acute upper respiratory infection, unspecified: Secondary | ICD-10-CM | POA: Diagnosis not present

## 2015-06-24 MED ORDER — CEFUROXIME AXETIL 250 MG PO TABS: 250.0000 mg | ORAL_TABLET | Freq: Two times a day (BID) | ORAL | Status: DC

## 2015-06-24 MED ORDER — HYDROCODONE-HOMATROPINE 5-1.5 MG/5ML PO SYRP: 5.0000 mL | ORAL_SOLUTION | Freq: Three times a day (TID) | ORAL | Status: DC | PRN

## 2015-06-24 NOTE — Patient Instructions (Signed)
Thank you for choosing [facility].  Summary/Instructions:  Your prescription(s) have been submitted to your pharmacy or been printed and provided for you. Please take as directed and contact our office if you believe you are having problem(s) with the medication(s) or have any questions.  If your symptoms worsen or fail to improve, please contact our office for further instruction, or in case of emergency go directly to the emergency room at the closest medical facility.   Upper Respiratory Infection, Adult An upper respiratory infection (URI) is also sometimes known as the common cold. The upper respiratory tract includes the nose, sinuses, throat, trachea, and bronchi. Bronchi are the airways leading to the lungs. Most people improve within 1 week, but symptoms can last up to 2 weeks. A residual cough may last even longer.  CAUSES Many different viruses can infect the tissues lining the upper respiratory tract. The tissues become irritated and inflamed and often become very moist. Mucus production is also common. A cold is contagious. You can easily spread the virus to others by oral contact. This includes kissing, sharing a glass, coughing, or sneezing. Touching your mouth or nose and then touching a surface, which is then touched by another person, can also spread the virus. SYMPTOMS  Symptoms typically develop 1 to 3 days after you come in contact with a cold virus. Symptoms vary from person to person. They may include: 1. Runny nose. 2. Sneezing. 3. Nasal congestion. 4. Sinus irritation. 5. Sore throat. 6. Loss of voice (laryngitis). 7. Cough. 8. Fatigue. 9. Muscle aches. 10. Loss of appetite. 11. Headache. 12. Low-grade fever. DIAGNOSIS  You might diagnose your own cold based on familiar symptoms, since most people get a cold 2 to 3 times a year. Your caregiver can confirm this based on your exam. Most importantly, your caregiver can check that your symptoms are not due to  another disease such as strep throat, sinusitis, pneumonia, asthma, or epiglottitis. Blood tests, throat tests, and X-rays are not necessary to diagnose a common cold, but they may sometimes be helpful in excluding other more serious diseases. Your caregiver will decide if any further tests are required. RISKS AND COMPLICATIONS  You may be at risk for a more severe case of the common cold if you smoke cigarettes, have chronic heart disease (such as heart failure) or lung disease (such as asthma), or if you have a weakened immune system. The very young and very old are also at risk for more serious infections. Bacterial sinusitis, middle ear infections, and bacterial pneumonia can complicate the common cold. The common cold can worsen asthma and chronic obstructive pulmonary disease (COPD). Sometimes, these complications can require emergency medical care and may be life-threatening. PREVENTION  The best way to protect against getting a cold is to practice good hygiene. Avoid oral or hand contact with people with cold symptoms. Wash your hands often if contact occurs. There is no clear evidence that vitamin C, vitamin E, echinacea, or exercise reduces the chance of developing a cold. However, it is always recommended to get plenty of rest and practice good nutrition. TREATMENT  Treatment is directed at relieving symptoms. There is no cure. Antibiotics are not effective, because the infection is caused by a virus, not by bacteria. Treatment may include: 1. Increased fluid intake. Sports drinks offer valuable electrolytes, sugars, and fluids. 2. Breathing heated mist or steam (vaporizer or shower). 3. Eating chicken soup or other clear broths, and maintaining good nutrition. 4. Getting plenty of rest.   5. Using gargles or lozenges for comfort. 6. Controlling fevers with ibuprofen or acetaminophen as directed by your caregiver. 7. Increasing usage of your inhaler if you have asthma. Zinc gel and zinc  lozenges, taken in the first 24 hours of the common cold, can shorten the duration and lessen the severity of symptoms. Pain medicines may help with fever, muscle aches, and throat pain. A variety of non-prescription medicines are available to treat congestion and runny nose. Your caregiver can make recommendations and may suggest nasal or lung inhalers for other symptoms.  HOME CARE INSTRUCTIONS  1. Only take over-the-counter or prescription medicines for pain, discomfort, or fever as directed by your caregiver. 2. Use a warm mist humidifier or inhale steam from a shower to increase air moisture. This may keep secretions moist and make it easier to breathe. 3. Drink enough water and fluids to keep your urine clear or pale yellow. 4. Rest as needed. 5. Return to work when your temperature has returned to normal or as your caregiver advises. You may need to stay home longer to avoid infecting others. You can also use a face mask and careful hand washing to prevent spread of the virus. SEEK MEDICAL CARE IF:  1. After the first few days, you feel you are getting worse rather than better. 2. You need your caregiver's advice about medicines to control symptoms. 3. You develop chills, worsening shortness of breath, or Vargus or red sputum. These may be signs of pneumonia. 4. You develop yellow or Mavity nasal discharge or pain in the face, especially when you bend forward. These may be signs of sinusitis. 5. You develop a fever, swollen neck glands, pain with swallowing, or white areas in the back of your throat. These may be signs of strep throat. SEEK IMMEDIATE MEDICAL CARE IF:  1. You have a fever. 2. You develop severe or persistent headache, ear pain, sinus pain, or chest pain. 3. You develop wheezing, a prolonged cough, cough up blood, or have a change in your usual mucus (if you have chronic lung disease). 4. You develop sore muscles or a stiff neck. Document Released: 04/18/2001 Document Revised:  01/15/2012 Document Reviewed: 01/28/2014 [facility] Patient Information 2015 [place], [place]. This information is not intended to replace advice given to you by your health care provider. Make sure you discuss any questions you have with your health care provider.  General Recommendations:    Please drink plenty of fluids.  Get plenty of rest   Sleep in humidified air  Use saline nasal sprays  Netti pot   OTC Medications:  Decongestants - helps relieve congestion   Flonase (generic fluticasone) or Nasacort (generic triamcinolone) - please make sure to use the "cross-over" technique at a 45 degree angle towards the opposite eye as opposed to straight up the nasal passageway.   Sudafed (generic pseudoephedrine - Note this is the one that is available behind the pharmacy counter); Products with phenylephrine (-PE) may also be used but is often not as effective as pseudoephedrine.   If you have HIGH BLOOD PRESSURE - Coricidin HBP; AVOID any product that is -D as this contains pseudoephedrine which may increase your blood pressure.  Afrin (oxymetazoline) every 6-8 hours for up to 3 days.   Allergies - helps relieve runny nose, itchy eyes and sneezing   Claritin (generic loratidine), Allegra (fexofenidine), or Zyrtec (generic cyrterizine) for runny nose. These medications should not cause drowsiness.  Note - Benadryl (generic diphenhydramine) may be used however may cause  drowsiness  Cough -   Delsym or Robitussin (generic dextromethorphan)  Expectorants - helps loosen mucus to ease removal   Mucinex (generic guaifenesin) as directed on the package.  Headaches / General Aches   Tylenol (generic acetaminophen) - DO NOT EXCEED 3 grams (3,000 mg) in a 24 hour time period  Advil/Motrin (generic ibuprofen)   Sore Throat -   Salt water gargle   Chloraseptic (generic benzocaine) spray or lozenges / Sucrets (generic dyclonine)

## 2015-06-24 NOTE — Assessment & Plan Note (Signed)
Symptoms and exam consistent with acute upper respiratory infection most likely bacterial secondary to improvement and worsening. Start Ceftin. Start Hycodan as needed for cough and sleep. Continue over-the-counter medications as needed for symptom relief and supportive care. Follow-up if symptoms worsen or fail to improve.

## 2015-06-24 NOTE — Progress Notes (Signed)
Pre visit review using our clinic review tool, if applicable. No additional management support is needed unless otherwise documented below in the visit note. 

## 2015-06-24 NOTE — Progress Notes (Signed)
Subjective:    Patient ID: Shelley Bray, female    DOB: Mar 30, 1946, 69 y.o.   MRN: [id number]  Chief Complaint  Patient presents with  . Cough    says that she has been sick since saturday, cough, weak, little dizzy, headache, woke up with red eyes this morning    HPI:  Shelley Bray is a 69 y.o. female with a PMH of hypertension, GERD, dyslipidemia, anxiety, ADD, chronic fatigue, and fibrocystic breast disease who presents today for an acute office visit.  This is a new problem. Associated symptoms of cough, weakness, dizziness, headaches, and eye redness has been going on for about 6 days. Modifying factors include coricidin HBP and mucinex which temporarily helped. . Indicates that she felt improvement on the second day and though she would get over it, however then has gotten progressively worse. Denies recent antibiotic use.   Allergies  Allergen Reactions  . Benadryl [Diphenhydramine Hcl]     cough  . Crestor [Rosuvastatin Calcium]     weak  . Shellfish Allergy     Current Outpatient Prescriptions on File Prior to Visit  Medication Sig Dispense Refill  . ALPRAZolam (XANAX) 0.5 MG tablet take 1 tablet by mouth at bedtime 90 tablet 1  . Cholecalciferol (VITAMIN D3) 2000 UNITS capsule Take 1 capsule (2,000 Units total) by mouth daily. 100 capsule 3  . COD LIVER OIL PO Take 1-2 each by mouth once a week.    . galantamine (RAZADYNE) 12 MG tablet Take 1 tablet (12 mg total) by mouth 2 (two) times daily. 180 tablet 3  . hydrochlorothiazide (MICROZIDE) 12.5 MG capsule Take 1 capsule (12.5 mg total) by mouth daily. 90 capsule 3  . Lysine 500 MG CAPS 1 po qd 100 capsule 3  . meloxicam (MOBIC) 15 MG tablet Take 1 tablet (15 mg total) by mouth daily as needed for pain. 30 tablet 4  . Multiple Vitamin (MULTI-VITAMIN PO) Take by mouth daily.    . nitroGLYCERIN (NITROSTAT) 0.4 MG SL tablet Place 1 tablet (0.4 mg total) under the tongue every 5 (five) minutes as needed for chest pain.  20 tablet 0  . polyethylene glycol powder (GLYCOLAX/MIRALAX) powder Take 17 g by mouth 2 (two) times daily as needed. 500 g 3  . Probiotic Product (ULTRAFLORA IMMUNE HEALTH) 170 MG CAPS 1 po qd 100 capsule 3  . traMADol (ULTRAM) 50 MG tablet take 1 to 2 tablets by mouth twice a day if needed for pain 60 tablet 3  . vitamin C (ASCORBIC ACID) 250 MG tablet Take 1 tablet (250 mg total) by mouth 2 (two) times daily. (Patient taking differently: Take 250 mg by mouth as needed. ) 100 tablet 3   Current Facility-Administered Medications on File Prior to Visit  Medication Dose Route Frequency Provider Last Rate Last Dose  . promethazine (PHENERGAN) injection 50 mg  50 mg Intramuscular Q6H PRN Jacinta Shoe V, MD   50 mg at 05/08/12 1106      Review of Systems  HENT: Positive for congestion, sinus pressure, sore throat and voice change. Negative for ear pain.   Eyes: Positive for redness.  Respiratory: Positive for cough and shortness of breath. Negative for chest tightness.   Cardiovascular: Negative for chest pain.  Neurological: Positive for weakness and headaches.      Objective:    BP 130/88 mmHg  Pulse 86  Temp(Src) 98.4 F (36.9 C) (Oral)  Resp 18  Ht  (1.676 m)  Wt  195 lb (88.451 kg)  BMI 31.49 kg/m2  SpO2 97% Nursing note and vital signs reviewed.  Physical Exam  Constitutional: She is oriented to person, place, and time. She appears well-developed and well-nourished. No distress.  HENT:  Right Ear: Hearing, tympanic membrane, external ear and ear canal normal.  Left Ear: Hearing, tympanic membrane, external ear and ear canal normal.  Nose: Nose normal. Right sinus exhibits no maxillary sinus tenderness and no frontal sinus tenderness. Left sinus exhibits no maxillary sinus tenderness and no frontal sinus tenderness.  Mouth/Throat: Uvula is midline, oropharynx is clear and moist and mucous membranes are normal.  Cardiovascular: Normal rate, regular rhythm, normal  heart sounds and intact distal pulses.   Pulmonary/Chest: Effort normal and breath sounds normal.  Neurological: She is alert and oriented to person, place, and time.  Skin: Skin is warm and dry.  Psychiatric: She has a normal mood and affect. Her behavior is normal. Judgment and thought content normal.       Assessment & Plan:   Problem List Items Addressed This Visit      Respiratory   Acute upper respiratory infection - Primary    Symptoms and exam consistent with acute upper respiratory infection most likely bacterial secondary to improvement and worsening. Start Ceftin. Start Hycodan as needed for cough and sleep. Continue over-the-counter medications as needed for symptom relief and supportive care. Follow-up if symptoms worsen or fail to improve.      Relevant Medications   cefUROXime (CEFTIN) 250 MG tablet   HYDROcodone-homatropine (HYCODAN) 5-1.5 MG/5ML syrup

## 2015-08-20 ENCOUNTER — Ambulatory Visit: Payer: Medicare Other | Admitting: Internal Medicine

## 2016-01-17 ENCOUNTER — Telehealth: Payer: Self-pay

## 2016-01-17 NOTE — Telephone Encounter (Signed)
Called in to [facility] 

## 2016-01-17 NOTE — Telephone Encounter (Signed)
Recd faxed rx request from [facility] on [address]. for tramadol 50mg  tab----please advise, thanks

## 2016-01-17 NOTE — Telephone Encounter (Signed)
Recd fa

## 2016-01-17 NOTE — Telephone Encounter (Signed)
OK to fill this prescription with additional refills x1 Thank you!  

## 2016-02-28 ENCOUNTER — Telehealth: Payer: Self-pay

## 2016-02-28 ENCOUNTER — Encounter: Payer: Self-pay | Admitting: Internal Medicine

## 2016-02-28 ENCOUNTER — Ambulatory Visit (INDEPENDENT_AMBULATORY_CARE_PROVIDER_SITE_OTHER): Payer: Medicare Other | Admitting: Internal Medicine

## 2016-02-28 VITALS — BP 120/70 | HR 71 | Temp 97.9°F | Ht 67.0 in | Wt 198.5 lb

## 2016-02-28 DIAGNOSIS — I10 Essential (primary) hypertension: Secondary | ICD-10-CM | POA: Diagnosis not present

## 2016-02-28 DIAGNOSIS — Z0000 Encounter for general adult medical examination without abnormal findings: Secondary | ICD-10-CM | POA: Diagnosis not present

## 2016-02-28 DIAGNOSIS — R5382 Chronic fatigue, unspecified: Secondary | ICD-10-CM | POA: Diagnosis not present

## 2016-02-28 DIAGNOSIS — F411 Generalized anxiety disorder: Secondary | ICD-10-CM | POA: Diagnosis not present

## 2016-02-28 DIAGNOSIS — Z23 Encounter for immunization: Secondary | ICD-10-CM | POA: Diagnosis not present

## 2016-02-28 DIAGNOSIS — K219 Gastro-esophageal reflux disease without esophagitis: Secondary | ICD-10-CM | POA: Diagnosis not present

## 2016-02-28 MED ORDER — IBUPROFEN 600 MG PO TABS: 600.0000 mg | ORAL_TABLET | Freq: Three times a day (TID) | ORAL | Status: DC | PRN

## 2016-02-28 NOTE — Assessment & Plan Note (Signed)
Labs

## 2016-02-28 NOTE — Assessment & Plan Note (Signed)
Doing well Labs  

## 2016-02-28 NOTE — Progress Notes (Signed)
Subjective:   Shelley BeringBrenda W Bray is a 70 y.o. female who presents for Medicare Annual (Subsequent) preventive examination.  Review of Systems:  HRA assessment completed during visit; Shelley Bray  The Patient was informed that this wellness visit is to identify risk and educate on how to reduce risk for increase disease through lifestyle changes.   ROS deferred to CPE exam with physician  Family and medical hx given below;   Son; Diabetes type I and expired x 72 yo  Dtr; living now  Just lost god- dtr last week; she was 89;  Mood overall hopeful and good;   Lifestyle review:  Lipids 2016; chol 253; Trig 117; HDL 63 and LDL 167 (glucose 83)  (taking turmeric  tsp in apple juice; ginger) feels her chol will be improved this year HTN; well controlled; takes HCTZ if needed  Grief due to loss of child; with subsequent memory issues but not noted today  Tobacco: Never smoked ETOH: no   Medication review/ no issues; HCTZ prn;   BMI: 31;  Diet;  Intermittent fasting; about 6 months ago  In the am; has 10am cereal or toast; Salad and protein for lunch; At hs just has fruit;  Stops 8 hours after the 1st meal  Weekends has Mal[place]urkey bacon;  Does not eat beef or pork;   Exercise;  Will do 10 minutes; does 100 sit ups every day or bicycles exercise  [place] [employer] as far away as she can;  30 minutes of moderate exercise  Has a flower garden and works in [employer] eery day   HOME SAFETY; multi level; but everything on first floor  Age in place? No (Long term plan to stay in home)  Removal of clutter clearing paths through the home, spouse takes care of this  Railing as needed for stairs; or in bathroom  Bathroom safety; Shower in tub or free standing: has both  Buyer, retailCommunity safety; yes Smoke detectors yes Firearms safety reviewed and will keep in a safe place if these exist. 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 Issues making decisions . Less interest in hobbies / activities . Repeats questions, stories; family complaining . Trouble using ordinary gadgets; microwave; computer . Forgets the month or year . Mismanaging finances . Missing apt . Daily problems with thinking of memory Ad8 score 0   Fall assessment: no  Gait assessment appears normal;   Mobilization and Functional losses from last year to this year?  No    fecal incontinence reviewed/ diverticulitis will flare up on occasion Drinks gingerale and this helps  Urinary; no issues of concern    Counseling Health Maintenance Hep C: to be drawn Colonoscopy; 2016; Dr. Loreta AveMann; she is in [place]; 2026 next due  EKG: 02/2013  Mammogram 11/2014/ sees gyn;  Dexa: it was normal  PAP; still q 3 years /see GYN   Hearing: good; 4000 right ear; 2000hz  left ; notes no issues   Ophthalmology exam; just had eye exam; probably in a few years she  may have to have cataracts   Immunizations: has not had pneumonia series States she has one pneumonia;  [facility]; [facility];  Tetanus  Advanced Directive; none recorded  Health Recommendations and Referrals Will fup on Prevnar or psv 23; thinks she had one;    Current Care Team reviewed and updated   Cardiac Risk Factors include: advanced age (>15men, >72 women);dyslipidemia;obesity (BMI >30kg/m2)     Objective:     Vitals: BP 120/70 mmHg  Pulse 71  Temp(Src) 97.9 F (36.6 C) (Oral)  Ht  (1.702 m)  Wt 198 lb 8 oz (90.039 kg)  BMI 31.08 kg/m2  SpO2 96%  Body mass  index is 31.08 kg/(m^2).   Tobacco History  Smoking status  . Never Smoker   Smokeless tobacco  . Not on file     Counseling given: Yes   Past Medical History  Diagnosis Date  . ANXIETY   . GERD   . HYPERLIPIDEMIA   . HYPERTENSION   . SHELLFISH ALLERGY   . Memory loss     after son died in  Mar 27, 2001   Past Surgical History  Procedure Laterality Date  . Tubal ligation     Family History  Problem Relation Age of Onset  . Breast cancer Sister   . Cervical cancer Sister   . Ovarian cancer Sister   . Heart attack Father 18  . Heart attack Brother 85   History  Sexual Activity  . Sexual Activity: Not on file    Outpatient Encounter Prescriptions as of 02/28/2016  Medication Sig  . ALPRAZolam (XANAX) 0.5 MG tablet take 1 tablet by mouth at bedtime  . Cholecalciferol (VITAMIN D3) 2000 UNITS capsule Take 1 capsule (2,000 Units total) by mouth daily.  Marland Kitchen galantamine (RAZADYNE) 12 MG tablet Take 1 tablet (12 mg total) by mouth 2 (two) times daily.  Marland Kitchen Lysine 500 MG CAPS 1 po qd  . Multiple Vitamin (MULTI-VITAMIN PO) Take by mouth daily.  . nitroGLYCERIN (NITROSTAT) 0.4 MG SL tablet Place 1 tablet (0.4 mg total) under the tongue every 5 (five) minutes as needed for chest pain.  . polyethylene glycol powder (GLYCOLAX/MIRALAX) powder Take 17 g by mouth 2 (two) times daily as needed.  . Probiotic Product (ULTRAFLORA IMMUNE HEALTH) 170 MG CAPS 1 po qd  . traMADol (ULTRAM) 50 MG tablet take 1 to 2 tablets by mouth twice a day if needed for pain  . vitamin C (ASCORBIC ACID) 250 MG tablet Take 1 tablet (250 mg total) by mouth 2 (two) times daily. (Patient taking differently: Take 250 mg by mouth as needed. )  . cefUROXime (CEFTIN) 250 MG tablet Take 1 tablet (250 mg total) by mouth 2 (two) times daily with a meal. (Patient not taking: Reported on 02/28/2016)  . COD LIVER OIL PO Take 1-2 each by mouth once a week. Reported on 02/28/2016  . hydrochlorothiazide (MICROZIDE) 12.5 MG capsule Take  1 capsule (12.5 mg total) by mouth daily. (Patient not taking: Reported on 02/28/2016)  . HYDROcodone-homatropine (HYCODAN) 5-1.5 MG/5ML syrup Take 5 mLs by mouth every 8 (eight) hours as needed for cough. (Patient not taking: Reported on 02/28/2016)  . meloxicam (MOBIC) 15 MG tablet Take 1 tablet (15 mg total) by mouth daily as needed for pain. (Patient not taking: Reported on 02/28/2016)   Facility-Administered Encounter Medications as of 02/28/2016  Medication  . promethazine (PHENERGAN) injection 50 mg    Activities of Daily Living In your present  state of health, do you have any difficulty performing the following activities: 02/28/2016  Hearing? N  Vision? N  Difficulty concentrating or making decisions? N  Walking or climbing stairs? N  Dressing or bathing? N  Doing errands, shopping? N  Preparing Food and eating ? N  Using the Toilet? N  In the past six months, have you accidently leaked urine? N  Do you have problems with loss of bowel control? N  Managing your Medications? N  Managing your Finances? N  Housekeeping or managing your Housekeeping? N    Patient Care Team: Tresa Garter, MD as PCP - General (Internal Medicine) Charna Elizabeth, MD as Attending Physician (Gastroenterology) Kathleene Hazel, MD as Attending Physician (Cardiology)    Assessment:     Exercise Activities and Dietary recommendations Current Exercise Habits: Home exercise routine, Time (Minutes): 60, Frequency (Times/Week): 3, Weekly Exercise (Minutes/Week): 180, Intensity: Moderate  Goals    . Weight < 180 lb (81.647 kg)     Will exercise a little more;  Work slow at this goal      Fall Risk Fall Risk  02/28/2016 02/18/2015  Falls in the past year? No Yes  Number falls in past yr: - 1  Injury with Fall? - No   Depression Screen PHQ 2/9 Scores 02/28/2016 02/18/2015  PHQ - 2 Score 0 0     Cognitive Testing MMSE - Mini Mental State Exam 02/28/2016  Not completed: (No Data)     Immunization History  Administered Date(s) Administered  . Influenza, High Dose Seasonal PF 07/31/2014, 08/02/2015  . Influenza-Unspecified 08/06/2012, 08/13/2013  . Zoster 01/17/2011   Screening Tests Health Maintenance  Topic Date Due  . Hepatitis C Screening  [birth year]  . TETANUS/TDAP  08/06/1963  . COLONOSCOPY  08/05/1994  . DEXA SCAN  08/05/2009  . PNA vac Low Risk Adult (1 of 2 - PCV13) 08/05/2009  . INFLUENZA VACCINE  06/06/2016  . MAMMOGRAM  11/06/2016  . ZOSTAVAX  Completed      Plan:   Wants to lose 10lbs;   During the course of the visit the patient was educated and counseled about the following appropriate screening and preventive services:   Vaccines to include Pneumoccal, Influenza, Hepatitis B, Td, Zostavax, HCV  Will check on prevnar and psv; feels she has had one of them  Electrocardiogram ; 02/2013  Cardiovascular Disease  Colorectal cancer screening/ no new issues   Bone density screening; states it was normal  Diabetes screening neg  Glaucoma screening/neg  Mammography/PAP follows with obgyn; due 2018  Nutrition counseling / monitors diet; discussed monitoring sodium;   Patient Instructions (the written plan) was given to the patient.   Montine Circle, RN  02/28/2016

## 2016-02-28 NOTE — Patient Instructions (Addendum)
  Shelley Bray , Thank you for taking time to come for your Medicare Wellness Visit. I appreciate your ongoing commitment to your health goals. Please review the following plan we discussed and let me know if I can assist you in the future.   Will continue to try to lose a few pounds;   Will consider walking again; or find other activity;   These are the goals we discussed:  Lose a few pounds;  Goals    None      This is a list of the screening recommended for you and due dates:  Health Maintenance  Topic Date Due  .  Hepatitis C: One time screening is recommended by Center for Disease Control  (CDC) for  adults born from [birth year] through 1967.   03-18-1946  . Tetanus Vaccine  08/06/1963  . Colon Cancer Screening  08/05/1994  . DEXA scan (bone density measurement)  08/05/2009  . Pneumonia vaccines (1 of 2 - PCV13) 08/05/2009  . Flu Shot  06/06/2016  . Mammogram  11/06/2016  . Shingles Vaccine  Completed

## 2016-02-28 NOTE — Assessment & Plan Note (Signed)
Doing well 

## 2016-02-28 NOTE — Progress Notes (Signed)
Subjective:  Patient ID: [name], female    DOB: [birth year]  Age: 70 y.o. MRN: [id number]  CC: Medicare Wellness   HPI ETHAL MONTNEY presents for anxiety, memory issues, OA f/u. C/o a HA - long time - 2+ hrs at the office  Outpatient Prescriptions Prior to Visit  Medication Sig Dispense Refill  . ALPRAZolam (XANAX) 0.5 MG tablet take 1 tablet by mouth at bedtime 90 tablet 1  . Cholecalciferol (VITAMIN D3) 2000 UNITS capsule Take 1 capsule (2,000 Units total) by mouth daily. 100 capsule 3  . galantamine (RAZADYNE) 12 MG tablet Take 1 tablet (12 mg total) by mouth 2 (two) times daily. 180 tablet 3  . Lysine 500 MG CAPS 1 po qd 100 capsule 3  . Multiple Vitamin (MULTI-VITAMIN PO) Take by mouth daily.    . nitroGLYCERIN (NITROSTAT) 0.4 MG SL tablet Place 1 tablet (0.4 mg total) under the tongue every 5 (five) minutes as needed for chest pain. 20 tablet 0  . polyethylene glycol powder (GLYCOLAX/MIRALAX) powder Take 17 g by mouth 2 (two) times daily as needed. 500 g 3  . Probiotic Product (ULTRAFLORA IMMUNE HEALTH) 170 MG CAPS 1 po qd 100 capsule 3  . traMADol (ULTRAM) 50 MG tablet take 1 to 2 tablets by mouth twice a day if needed for pain 60 tablet 3  . vitamin C (ASCORBIC ACID) 250 MG tablet Take 1 tablet (250 mg total) by mouth 2 (two) times daily. (Patient taking differently: Take 250 mg by mouth as needed. ) 100 tablet 3  . cefUROXime (CEFTIN) 250 MG tablet Take 1 tablet (250 mg total) by mouth 2 (two) times daily with a meal. (Patient not taking: Reported on 02/28/2016) 20 tablet 0  . COD LIVER OIL PO Take 1-2 each by mouth once a week. Reported on 02/28/2016    . hydrochlorothiazide (MICROZIDE) 12.5 MG capsule Take 1 capsule (12.5 mg total) by mouth daily. (Patient not taking: Reported on 02/28/2016) 90 capsule 3  . HYDROcodone-homatropine (HYCODAN) 5-1.5 MG/5ML syrup Take 5 mLs by mouth every 8 (eight) hours as needed for cough. (Patient not taking: Reported on 02/28/2016) 120 mL 0    . meloxicam (MOBIC) 15 MG tablet Take 1 tablet (15 mg total) by mouth daily as needed for pain. (Patient not taking: Reported on 02/28/2016) 30 tablet 4   Facility-Administered Medications Prior to Visit  Medication Dose Route Frequency Provider Last Rate Last Dose  . promethazine (PHENERGAN) injection 50 mg  50 mg Intramuscular Q6H PRN Arel Tippen V, MD   50 mg at 05/08/12 1106    ROS Review of Systems  Constitutional: Negative for chills, activity change, appetite change, fatigue and unexpected weight change.  HENT: Negative for congestion, mouth sores and sinus pressure.   Eyes: Negative for visual disturbance.  Respiratory: Negative for cough and chest tightness.   Gastrointestinal: Negative for nausea and abdominal pain.  Genitourinary: Negative for frequency, difficulty urinating and vaginal pain.  Musculoskeletal: Negative for back pain and gait problem.  Skin: Negative for pallor and rash.  Neurological: Positive for headaches. Negative for dizziness, tremors, weakness and numbness.  Psychiatric/Behavioral: Negative for suicidal ideas, confusion and sleep disturbance. The patient is not nervous/anxious.     Objective:  BP 120/70 mmHg  Pulse 71  Temp(Src) 97.9 F (36.6 C) (Oral)  Ht 5\' 7"  (1.702 m)  Wt 198 lb 8 oz (90.039 kg)  BMI 31.08 kg/m2  SpO2 96%  BP Readings from Last 3 Encounters:  02/28/16 120/70  06/24/15 130/88  02/18/15 118/78    Wt Readings from Last 3 Encounters:  02/28/16 198 lb 8 oz (90.039 kg)  06/24/15 195 lb (88.451 kg)  02/18/15 192 lb (87.091 kg)    Physical Exam  Constitutional: She appears well-developed. No distress.  HENT:  Head: Normocephalic.  Right Ear: External ear normal.  Left Ear: External ear normal.  Nose: Nose normal.  Mouth/Throat: Oropharynx is clear and moist.  Eyes: Conjunctivae are normal. Pupils are equal, round, and reactive to light. Right eye exhibits no discharge. Left eye exhibits no discharge.  Neck:  Normal range of motion. Neck supple. No JVD present. No tracheal deviation present. No thyromegaly present.  Cardiovascular: Normal rate, regular rhythm and normal heart sounds.   Pulmonary/Chest: No stridor. No respiratory distress. She has no wheezes.  Abdominal: Soft. Bowel sounds are normal. She exhibits no distension and no mass. There is no tenderness. There is no rebound and no guarding.  Musculoskeletal: She exhibits no edema or tenderness.  Lymphadenopathy:    She has no cervical adenopathy.  Neurological: She displays normal reflexes. No cranial nerve deficit. She exhibits normal muscle tone. Coordination normal.  Skin: No rash noted. No erythema.  Psychiatric: She has a normal mood and affect. Her behavior is normal. Judgment and thought content normal.    Lab Results  Component Value Date   WBC 5.5 01/09/2014   HGB 12.5 01/09/2014   HCT 37.6 01/09/2014   PLT 270.0 01/09/2014   GLUCOSE 88 02/18/2015   CHOL 253* 02/18/2015   TRIG 117.0 02/18/2015   HDL 63.00 02/18/2015   LDLDIRECT 152.9 10/14/2012   LDLCALC 167* 02/18/2015   ALT 18 01/09/2014   AST 22 01/09/2014   NA 140 02/18/2015   K 3.9 02/18/2015   CL 103 02/18/2015   CREATININE 0.86 02/18/2015   BUN 13 02/18/2015   CO2 30 02/18/2015   TSH 2.48 02/18/2015    Dg Chest 2 View  12/15/2011  *RADIOLOGY REPORT* Clinical Data: Cough, wheezing CHEST - 2 VIEW Comparison: Chest x-ray of 08/28/2011 Findings: The lungs are clear.  Mediastinal contours appear normal. The heart is within upper limits of normal.  No bony abnormality is seen. IMPRESSION: Stable chest x-ray.  No active lung disease. Original Report Authenticated By: Juline Patch, M.D.   Assessment & Plan:   There are no diagnoses linked to this encounter. I am having Ms. Longino maintain her Multiple Vitamin (MULTI-VITAMIN PO), nitroGLYCERIN, COD LIVER OIL PO, meloxicam, traMADol, hydrochlorothiazide, Vitamin D3, Lysine, polyethylene glycol powder, ULTRAFLORA  IMMUNE HEALTH, vitamin C, galantamine, ALPRAZolam, cefUROXime, and HYDROcodone-homatropine. We will continue to administer promethazine.  No orders of the defined types were placed in this encounter.     Follow-up: No Follow-up on file.  Sonda Primes, MD

## 2016-02-28 NOTE — Telephone Encounter (Signed)
Call to pharmacy on file to check on Pneumonia or tetanus; Pharmacy confirmed she has not had pneumonia or tetanus at [facility].

## 2016-02-28 NOTE — Telephone Encounter (Signed)
fup in records for tetanus but could not find hx of tetanus;  Shingles listed. No pneumonia vaccines located in record  Call to Dr. Loreta AveMann in [place]; colonoscopy completed 09/26/2013 and next due is Nov of 2024 per the office [phone])

## 2016-02-29 ENCOUNTER — Other Ambulatory Visit (INDEPENDENT_AMBULATORY_CARE_PROVIDER_SITE_OTHER): Payer: Medicare Other

## 2016-02-29 DIAGNOSIS — R5382 Chronic fatigue, unspecified: Secondary | ICD-10-CM

## 2016-02-29 DIAGNOSIS — I10 Essential (primary) hypertension: Secondary | ICD-10-CM | POA: Diagnosis not present

## 2016-02-29 DIAGNOSIS — F411 Generalized anxiety disorder: Secondary | ICD-10-CM

## 2016-02-29 DIAGNOSIS — K219 Gastro-esophageal reflux disease without esophagitis: Secondary | ICD-10-CM | POA: Diagnosis not present

## 2016-02-29 DIAGNOSIS — Z0000 Encounter for general adult medical examination without abnormal findings: Secondary | ICD-10-CM | POA: Diagnosis not present

## 2016-02-29 LAB — HEPATIC FUNCTION PANEL
ALT: 16 U/L (ref 0–35)
AST: 17 U/L (ref 0–37)
Albumin: 4 g/dL (ref 3.5–5.2)
Alkaline Phosphatase: 131 U/L — ABNORMAL HIGH (ref 39–117)
Bilirubin, Direct: 0.1 mg/dL (ref 0.0–0.3)
Total Bilirubin: 0.5 mg/dL (ref 0.2–1.2)
Total Protein: 7 g/dL (ref 6.0–8.3)

## 2016-02-29 LAB — URINALYSIS, ROUTINE W REFLEX MICROSCOPIC
Bilirubin Urine: NEGATIVE
Hgb urine dipstick: NEGATIVE
Ketones, ur: NEGATIVE
Nitrite: NEGATIVE
RBC / HPF: NONE SEEN (ref 0–?)
Specific Gravity, Urine: 1.015 (ref 1.000–1.030)
Total Protein, Urine: NEGATIVE
Urine Glucose: NEGATIVE
Urobilinogen, UA: 0.2 (ref 0.0–1.0)
pH: 7 (ref 5.0–8.0)

## 2016-02-29 LAB — TSH: TSH: 3.79 u[IU]/mL (ref 0.35–4.50)

## 2016-02-29 LAB — CBC WITH DIFFERENTIAL/PLATELET
Basophils Absolute: 0 10*3/uL (ref 0.0–0.1)
Basophils Relative: 0.5 % (ref 0.0–3.0)
Eosinophils Absolute: 0.1 10*3/uL (ref 0.0–0.7)
Eosinophils Relative: 1.7 % (ref 0.0–5.0)
HCT: 36 % (ref 36.0–46.0)
Hemoglobin: 12 g/dL (ref 12.0–15.0)
Lymphocytes Relative: 33 % (ref 12.0–46.0)
Lymphs Abs: 2.3 10*3/uL (ref 0.7–4.0)
MCHC: 33.3 g/dL (ref 30.0–36.0)
MCV: 84 fl (ref 78.0–100.0)
Monocytes Absolute: 0.4 10*3/uL (ref 0.1–1.0)
Monocytes Relative: 5.9 % (ref 3.0–12.0)
Neutro Abs: 4.1 10*3/uL (ref 1.4–7.7)
Neutrophils Relative %: 58.9 % (ref 43.0–77.0)
Platelets: 256 10*3/uL (ref 150.0–400.0)
RBC: 4.28 Mil/uL (ref 3.87–5.11)
RDW: 14 % (ref 11.5–15.5)
WBC: 6.9 10*3/uL (ref 4.0–10.5)

## 2016-02-29 LAB — BASIC METABOLIC PANEL
BUN: 12 mg/dL (ref 6–23)
CO2: 29 mEq/L (ref 19–32)
Calcium: 9.6 mg/dL (ref 8.4–10.5)
Chloride: 105 mEq/L (ref 96–112)
Creatinine, Ser: 0.78 mg/dL (ref 0.40–1.20)
GFR: 93.48 mL/min (ref >60.00)
Glucose, Bld: 86 mg/dL (ref 70–99)
Potassium: 4.3 mEq/L (ref 3.5–5.1)
Sodium: 141 mEq/L (ref 135–145)

## 2016-02-29 LAB — LIPID PANEL
Cholesterol: 204 mg/dL — ABNORMAL HIGH (ref 0–200)
HDL: 49 mg/dL (ref >39.00)
LDL Cholesterol: 124 mg/dL — ABNORMAL HIGH (ref 0–99)
NonHDL: 155.32
Total CHOL/HDL Ratio: 4
Triglycerides: 158 mg/dL — ABNORMAL HIGH (ref 0.0–149.0)
VLDL: 31.6 mg/dL (ref 0.0–40.0)

## 2016-03-02 ENCOUNTER — Other Ambulatory Visit: Payer: Self-pay | Admitting: Internal Medicine

## 2016-03-02 DIAGNOSIS — R945 Abnormal results of liver function studies: Principal | ICD-10-CM

## 2016-03-02 DIAGNOSIS — R7989 Other specified abnormal findings of blood chemistry: Secondary | ICD-10-CM

## 2016-03-08 ENCOUNTER — Telehealth: Payer: Self-pay | Admitting: Internal Medicine

## 2016-03-08 NOTE — Telephone Encounter (Signed)
Pt request to speak to the assistant concern about medication.

## 2016-03-08 NOTE — Telephone Encounter (Signed)
Left mess for patient to call back.  

## 2016-03-16 ENCOUNTER — Telehealth: Payer: Self-pay | Admitting: *Deleted

## 2016-03-16 MED ORDER — GALANTAMINE HYDROBROMIDE 12 MG PO TABS: 12.0000 mg | ORAL_TABLET | Freq: Two times a day (BID) | ORAL | Status: DC

## 2016-03-16 NOTE — Telephone Encounter (Signed)
Received call pt is requesting refill on her Razadyne. [facility] Also pt want to ask MD can he take her niece on as a new patient. She is between doctors and needing a good one. Niece has insurance...Raechel Chute/lmb

## 2016-03-16 NOTE — Telephone Encounter (Signed)
OK Razadyne ref #12 mo OK niece new pt Thx

## 2016-03-17 NOTE — Telephone Encounter (Signed)
Called pt no answer LMOM RTC.../lmb 

## 2016-03-20 NOTE — Telephone Encounter (Signed)
Called pt back inform her Dr. Macario GoldsPlot ok for her niece to be seen. Made appt for Friday 03/24/16...Raechel Chute/lmb

## 2016-03-24 ENCOUNTER — Other Ambulatory Visit: Payer: Self-pay | Admitting: *Deleted

## 2016-03-24 MED ORDER — HYDROCHLOROTHIAZIDE 12.5 MG PO CAPS: 12.5000 mg | ORAL_CAPSULE | Freq: Every day | ORAL | Status: DC

## 2016-03-24 NOTE — Telephone Encounter (Signed)
Pt req Rf on Alprazolam # 90 day supply. Ok to Rf?

## 2016-03-30 MED ORDER — ALPRAZOLAM 0.5 MG PO TABS: 0.5000 mg | ORAL_TABLET | Freq: Every day | ORAL | Status: DC

## 2016-03-31 NOTE — Telephone Encounter (Signed)
Done

## 2016-04-28 ENCOUNTER — Other Ambulatory Visit: Payer: Self-pay | Admitting: Internal Medicine

## 2016-08-30 ENCOUNTER — Encounter: Payer: Self-pay | Admitting: Internal Medicine

## 2016-08-30 ENCOUNTER — Ambulatory Visit (INDEPENDENT_AMBULATORY_CARE_PROVIDER_SITE_OTHER): Payer: Medicare Other | Admitting: Internal Medicine

## 2016-08-30 DIAGNOSIS — F411 Generalized anxiety disorder: Secondary | ICD-10-CM

## 2016-08-30 DIAGNOSIS — I10 Essential (primary) hypertension: Secondary | ICD-10-CM

## 2016-08-30 DIAGNOSIS — F419 Anxiety disorder, unspecified: Secondary | ICD-10-CM

## 2016-08-30 DIAGNOSIS — Z0000 Encounter for general adult medical examination without abnormal findings: Secondary | ICD-10-CM

## 2016-08-30 MED ORDER — IBUPROFEN 600 MG PO TABS: 600.0000 mg | ORAL_TABLET | Freq: Three times a day (TID) | ORAL | 1 refills | Status: DC | PRN

## 2016-08-30 NOTE — Assessment & Plan Note (Signed)

## 2016-08-30 NOTE — Progress Notes (Signed)
Subjective:  Patient ID: Shelley Bray, female    DOB: [birth year]  Age: 70 y.o. MRN: [id number]  CC: Annual Exam (annual exam)   HPI Shelley Bray presents for a well exam and anxiety, diverticulosis, MCD f/u  Outpatient Medications Prior to Visit  Medication Sig Dispense Refill  . ALPRAZolam (XANAX) 0.5 MG tablet Take 1 tablet (0.5 mg total) by mouth at bedtime. 90 tablet 3  . Cholecalciferol (VITAMIN D3) 2000 UNITS capsule Take 1 capsule (2,000 Units total) by mouth daily. 100 capsule 3  . galantamine (RAZADYNE) 12 MG tablet Take 1 tablet (12 mg total) by mouth 2 (two) times daily. 180 tablet 3  . hydrochlorothiazide (MICROZIDE) 12.5 MG capsule Take 1 capsule (12.5 mg total) by mouth daily. 90 capsule 3  . ibuprofen (ADVIL,MOTRIN) 600 MG tablet Take 1 tablet (600 mg total) by mouth every 8 (eight) hours as needed for headache. 60 tablet 0  . Lysine 500 MG CAPS 1 po qd 100 capsule 3  . Multiple Vitamin (MULTI-VITAMIN PO) Take by mouth daily.    . nitroGLYCERIN (NITROSTAT) 0.4 MG SL tablet Place 1 tablet (0.4 mg total) under the tongue every 5 (five) minutes as needed for chest pain. 20 tablet 0  . polyethylene glycol powder (GLYCOLAX/MIRALAX) powder Take 17 g by mouth 2 (two) times daily as needed. 500 g 3  . Probiotic Product (ULTRAFLORA IMMUNE HEALTH) 170 MG CAPS TAKE (1) CAPSULE DAILY. 60 capsule 5  . traMADol (ULTRAM) 50 MG tablet take 1 to 2 tablets by mouth twice a day if needed for pain 60 tablet 3  . vitamin C (ASCORBIC ACID) 250 MG tablet Take 1 tablet (250 mg total) by mouth 2 (two) times daily. (Patient taking differently: Take 250 mg by mouth as needed. ) 100 tablet 3  . COD LIVER OIL PO Take 1-2 each by mouth once a week. Reported on 02/28/2016     Facility-Administered Medications Prior to Visit  Medication Dose Route Frequency Provider Last Rate Last Dose  . promethazine (PHENERGAN) injection 50 mg  50 mg Intramuscular Q6H PRN Tresa Garter, MD   50 mg at 05/08/12  1106    ROS Review of Systems  Constitutional: Negative for activity change, appetite change, chills, fatigue and unexpected weight change.  HENT: Negative for congestion, mouth sores and sinus pressure.   Eyes: Negative for visual disturbance.  Respiratory: Negative for cough and chest tightness.   Gastrointestinal: Negative for abdominal pain and nausea.  Genitourinary: Negative for difficulty urinating, frequency and vaginal pain.  Musculoskeletal: Negative for back pain and gait problem.  Skin: Negative for pallor and rash.  Neurological: Negative for dizziness, tremors, weakness, numbness and headaches.  Psychiatric/Behavioral: Negative for confusion, sleep disturbance and suicidal ideas.    Objective:  BP (!) 138/92 (BP Location: Left Arm, Patient Position: Sitting, Cuff Size: Normal)   Pulse 76   Temp 97.6 F (36.4 C)   Ht 5\' 7"  (1.702 m)   Wt 196 lb (88.9 kg)   SpO2 97%   BMI 30.70 kg/m   BP Readings from Last 3 Encounters:  08/30/16 (!) 138/92  02/28/16 120/70  06/24/15 130/88    Wt Readings from Last 3 Encounters:  08/30/16 196 lb (88.9 kg)  02/28/16 198 lb 8 oz (90 kg)  06/24/15 195 lb (88.5 kg)    Physical Exam  Constitutional: She appears well-developed. No distress.  HENT:  Head: Normocephalic.  Right Ear: External ear normal.  Left Ear: External ear normal.  Nose: Nose normal.  Mouth/Throat: Oropharynx is clear and moist.  Eyes: Conjunctivae are normal. Pupils are equal, round, and reactive to light. Right eye exhibits no discharge. Left eye exhibits no discharge.  Neck: Normal range of motion. Neck supple. No JVD present. No tracheal deviation present. No thyromegaly present.  Cardiovascular: Normal rate, regular rhythm and normal heart sounds.   Pulmonary/Chest: No stridor. No respiratory distress. She has no wheezes.  Abdominal: Soft. Bowel sounds are normal. She exhibits no distension and no mass. There is no tenderness. There is no rebound and  no guarding.  Musculoskeletal: She exhibits no edema or tenderness.  Lymphadenopathy:    She has no cervical adenopathy.  Neurological: She displays normal reflexes. No cranial nerve deficit. She exhibits normal muscle tone. Coordination normal.  Skin: No rash noted. No erythema.  Psychiatric: She has a normal mood and affect. Her behavior is normal. Judgment and thought content normal.  Obese  Lab Results  Component Value Date   WBC 6.9 02/29/2016   HGB 12.0 02/29/2016   HCT 36.0 02/29/2016   PLT 256.0 02/29/2016   GLUCOSE 86 02/29/2016   CHOL 204 (H) 02/29/2016   TRIG 158.0 (H) 02/29/2016   HDL 49.00 02/29/2016   LDLDIRECT 152.9 10/14/2012   LDLCALC 124 (H) 02/29/2016   ALT 16 02/29/2016   AST 17 02/29/2016   NA 141 02/29/2016   K 4.3 02/29/2016   CL 105 02/29/2016   CREATININE 0.78 02/29/2016   BUN 12 02/29/2016   CO2 29 02/29/2016   TSH 3.79 02/29/2016    Dg Chest 2 View  Result Date: 12/15/2011 *RADIOLOGY REPORT* Clinical Data: Cough, wheezing CHEST - 2 VIEW Comparison: Chest x-ray of 08/28/2011 Findings: The lungs are clear.  Mediastinal contours appear normal. The heart is within upper limits of normal.  No bony abnormality is seen. IMPRESSION: Stable chest x-ray.  No active lung disease. Original Report Authenticated By: Juline PatchPAUL D. BARRY, M.D.   Assessment & Plan:   There are no diagnoses linked to this encounter. I have discontinued Ms. Romanoski's COD LIVER OIL PO and vitamin C. I am also having her maintain her Multiple Vitamin (MULTI-VITAMIN PO), nitroGLYCERIN, traMADol, Vitamin D3, Lysine, polyethylene glycol powder, ibuprofen, galantamine, hydrochlorothiazide, ALPRAZolam, ULTRAFLORA IMMUNE HEALTH, and FLUAD. We will continue to administer promethazine.  Meds ordered this encounter  Medications  . FLUAD 0.5 ML SUSY    Sig: inject 0.5 milliliter intramuscularly    Refill:  0     Follow-up: No Follow-up on file.  Sonda PrimesAlex Plotnikov, MD

## 2016-08-30 NOTE — Progress Notes (Signed)
Pre visit review using our clinic review tool, if applicable. No additional management support is needed unless otherwise documented below in the visit note. 

## 2016-08-30 NOTE — Assessment & Plan Note (Signed)
BP is nl at home HCTZ 

## 2016-08-30 NOTE — Assessment & Plan Note (Signed)
Chronic  Xanax prn  Potential benefits of a long term benzodiazepines  use as well as potential risks  and complications were explained to the patient and were aknowledged. 

## 2016-09-01 ENCOUNTER — Other Ambulatory Visit: Payer: Self-pay | Admitting: Internal Medicine

## 2016-09-06 NOTE — Telephone Encounter (Signed)
Done

## 2016-09-09 ENCOUNTER — Emergency Department (HOSPITAL_COMMUNITY): Payer: Medicare Other

## 2016-09-09 ENCOUNTER — Emergency Department (HOSPITAL_COMMUNITY)
Admission: EM | Admit: 2016-09-09 | Discharge: 2016-09-09 | Disposition: A | Discharge status: Home / Self Care | Payer: Medicare Other | Attending: Emergency Medicine | Admitting: Emergency Medicine

## 2016-09-09 ENCOUNTER — Encounter (HOSPITAL_COMMUNITY): Payer: Self-pay

## 2016-09-09 DIAGNOSIS — Z79899 Other long term (current) drug therapy: Secondary | ICD-10-CM | POA: Diagnosis not present

## 2016-09-09 DIAGNOSIS — R270 Ataxia, unspecified: Secondary | ICD-10-CM | POA: Insufficient documentation

## 2016-09-09 DIAGNOSIS — R51 Headache: Secondary | ICD-10-CM | POA: Diagnosis not present

## 2016-09-09 DIAGNOSIS — I10 Essential (primary) hypertension: Secondary | ICD-10-CM | POA: Insufficient documentation

## 2016-09-09 DIAGNOSIS — R531 Weakness: Secondary | ICD-10-CM | POA: Insufficient documentation

## 2016-09-09 DIAGNOSIS — R200 Anesthesia of skin: Secondary | ICD-10-CM | POA: Insufficient documentation

## 2016-09-09 DIAGNOSIS — R29898 Other symptoms and signs involving the musculoskeletal system: Secondary | ICD-10-CM

## 2016-09-09 DIAGNOSIS — R519 Headache, unspecified: Secondary | ICD-10-CM

## 2016-09-09 DIAGNOSIS — R42 Dizziness and giddiness: Secondary | ICD-10-CM | POA: Diagnosis present

## 2016-09-09 LAB — COMPREHENSIVE METABOLIC PANEL
ALT: 16 U/L (ref 14–54)
AST: 21 U/L (ref 15–41)
Albumin: 3.8 g/dL (ref 3.5–5.0)
Alkaline Phosphatase: 108 U/L (ref 38–126)
Anion gap: 7 (ref 5–15)
BUN: 18 mg/dL (ref 6–20)
CO2: 24 mmol/L (ref 22–32)
Calcium: 9.3 mg/dL (ref 8.9–10.3)
Chloride: 108 mmol/L (ref 101–111)
Creatinine, Ser: 0.72 mg/dL (ref 0.44–1.00)
GFR calc Af Amer: >60 mL/min (ref >60)
GFR calc non Af Amer: >60 mL/min (ref >60)
Glucose, Bld: 99 mg/dL (ref 65–99)
Potassium: 3.8 mmol/L (ref 3.5–5.1)
Sodium: 139 mmol/L (ref 135–145)
Total Bilirubin: 0.5 mg/dL (ref 0.3–1.2)
Total Protein: 7.2 g/dL (ref 6.5–8.1)

## 2016-09-09 LAB — I-STAT CHEM 8, ED
BUN: 16 mg/dL (ref 6–20)
Calcium, Ion: 1.17 mmol/L (ref 1.15–1.40)
Chloride: 107 mmol/L (ref 101–111)
Creatinine, Ser: 0.9 mg/dL (ref 0.44–1.00)
Glucose, Bld: 97 mg/dL (ref 65–99)
HCT: 35 % — ABNORMAL LOW (ref 36.0–46.0)
Hemoglobin: 11.9 g/dL — ABNORMAL LOW (ref 12.0–15.0)
Potassium: 3.8 mmol/L (ref 3.5–5.1)
Sodium: 140 mmol/L (ref 135–145)
TCO2: 25 mmol/L (ref 0–100)

## 2016-09-09 LAB — URINALYSIS, ROUTINE W REFLEX MICROSCOPIC
Bilirubin Urine: NEGATIVE
Glucose, UA: NEGATIVE mg/dL
Hgb urine dipstick: NEGATIVE
Ketones, ur: NEGATIVE mg/dL
Nitrite: NEGATIVE
Protein, ur: NEGATIVE mg/dL
Specific Gravity, Urine: 1.012 (ref 1.005–1.030)
pH: 7 (ref 5.0–8.0)

## 2016-09-09 LAB — PROTIME-INR
INR: 0.98
Prothrombin Time: 13 seconds (ref 11.4–15.2)

## 2016-09-09 LAB — CBC
HCT: 34.2 % — ABNORMAL LOW (ref 36.0–46.0)
Hemoglobin: 11.4 g/dL — ABNORMAL LOW (ref 12.0–15.0)
MCH: 27.8 pg (ref 26.0–34.0)
MCHC: 33.3 g/dL (ref 30.0–36.0)
MCV: 83.4 fL (ref 78.0–100.0)
Platelets: 265 10*3/uL (ref 150–400)
RBC: 4.1 MIL/uL (ref 3.87–5.11)
RDW: 12.8 % (ref 11.5–15.5)
WBC: 7.2 10*3/uL (ref 4.0–10.5)

## 2016-09-09 LAB — APTT: aPTT: 29 seconds (ref 24–36)

## 2016-09-09 LAB — I-STAT TROPONIN, ED: Troponin i, poc: 0 ng/mL (ref 0.00–0.08)

## 2016-09-09 LAB — CBG MONITORING, ED: Glucose-Capillary: 102 mg/dL — ABNORMAL HIGH (ref 65–99)

## 2016-09-09 LAB — DIFFERENTIAL
Basophils Absolute: 0 10*3/uL (ref 0.0–0.1)
Basophils Relative: 0 %
Eosinophils Absolute: 0.1 10*3/uL (ref 0.0–0.7)
Eosinophils Relative: 2 %
Lymphocytes Relative: 37 %
Lymphs Abs: 2.7 10*3/uL (ref 0.7–4.0)
Monocytes Absolute: 0.4 10*3/uL (ref 0.1–1.0)
Monocytes Relative: 6 %
Neutro Abs: 4 10*3/uL (ref 1.7–7.7)
Neutrophils Relative %: 55 %

## 2016-09-09 LAB — URINE MICROSCOPIC-ADD ON

## 2016-09-09 MED ORDER — SODIUM CHLORIDE 0.9 % IV BOLUS (SEPSIS): 500.0000 mL | Freq: Once | INTRAVENOUS | Status: DC

## 2016-09-09 NOTE — ED Notes (Signed)
[facility] called for transportation.   

## 2016-09-09 NOTE — ED Provider Notes (Signed)
Medical screening examination/treatment/procedure(s) were conducted as a shared visit with non-physician practitioner(s) and myself.  I personally evaluated the patient during the encounter.   EKG Interpretation  Date/Time:  Saturday September 09 2016 01:00:58 EDT Ventricular Rate:  71 PR Interval:    QRS Duration: 92 QT Interval:  395 QTC Calculation: 430 R Axis:   57 Text Interpretation:  Sinus rhythm Confirmed by [facility]  MD, Morene AntuAPRIL ([id number]) on 09/09/2016 1:42:00 AM      I agree with Shelley Bray's HPI   HPI Comments:  Shelley BeringBrenda W Bray is a 70 y.o. female who presents to the Emergency Department complaining of Dizziness and lightheadedness.  She states her arms went limp and she felt hot and like she was going to fall.  Pt had to hold up on things to get up.  She asserts that she has never had that sensation before.  Husband states that pt's face looks normal, she denies facial droop.  Pt tripped and fell earlier this week.     NCAT Dense cataracts in both eyes PERRL, EOMI moist mucous membranes.  Neck is supple without bruits  RRR CTAB NABS Cranial nerves 2-12 intact.  5/5 strength. Negative romberg.  No pronator drift.   Transfer to [facility] for MRI    Avanelle Pixley, MD 09/09/16 [phone]

## 2016-09-09 NOTE — ED Notes (Addendum)
Removed 20 gauge IV from left AC that was present on assessment. Catheter intact.

## 2016-09-09 NOTE — ED Notes (Signed)
Pt transferred via [facility] from [facility]. No numbness, weakness reported, a&ox4. Here for MRI.

## 2016-09-09 NOTE — ED Notes (Signed)
ED Provider at bedside. 

## 2016-09-09 NOTE — ED Notes (Signed)
Report called to charge nurse, Collier SalinaKouna, RN

## 2016-09-09 NOTE — ED Provider Notes (Signed)
[facility]-EMERGENCY DEPT Provider Note   CSN: [id number] Arrival date & time: 09/09/16  0034 By signing my name below, I, Linus GalasMaharshi Patel, attest that this documentation has been prepared under the direction and in the presence of Elpidio AnisShari Dao Memmott, PA-C. Electronically Signed: Linus GalasMaharshi Patel, ED Scribe. 09/09/16. 1:21 AM.  History   Chief Complaint Chief Complaint  Patient presents with  . Dizziness  . Headache   The history is provided by the patient. No language interpreter was used.   HPI Comments: Shelley Bray is a 70 y.o. female who presents to the Emergency Department complaining of dizziness that began today. She states that soon after she became dizzy around 10 pm, she suddenly felt "hot all over" and felt as if both her arms were numb. She went to bed but then woke up from sleep at 12 am from another episode of feeling "hot all over". She denies syncope.  Pt also reports associated nausea and palpitations. Pt denies any appetite changes, visual changes, or any other symptoms at this time. She has been taking OTC cold medications for symptoms of afebrile URI. She reports recent fall one week ago with a facial contusion but denies dizziness associated with that fall. Last BM a few days ago.    Past Medical History:  Diagnosis Date  . ANXIETY   . GERD   . HYPERLIPIDEMIA   . HYPERTENSION   . Memory loss    after son died in  2002  . SHELLFISH ALLERGY    Patient Active Problem List   Diagnosis Date Noted  . Well adult exam 02/28/2016  . Acute upper respiratory infection 06/24/2015  . Chest pain 12/27/2012  . GERD (gastroesophageal reflux disease) 12/27/2012  . Abdominal pain, other specified site 05/08/2012  . Grief at loss of child 05/08/2012  . ADD (attention deficit disorder) 04/25/2011  . Chronic fatigue 04/25/2011  . Dyslipidemia 01/17/2011  . Anxiety state 01/17/2011  . MILD COGNITIVE IMPAIRMENT SO STATED 01/17/2011  . Essential hypertension 01/17/2011  . FIBROCYSTIC  BREAST DISEASE 01/17/2011  . SHELLFISH ALLERGY 01/17/2011   Past Surgical History:  Procedure Laterality Date  . TUBAL LIGATION     OB History    No data available     Home Medications    Prior to Admission medications   Medication Sig Start Date End Date Taking? Authorizing Provider  ALPRAZolam [name](XANAX) 0.5 MG tablet Take 1 tablet (0.5 mg total) by mouth at bedtime. 03/30/16   Tresa GarterAleksei V Plotnikov, MD  Cholecalciferol (VITAMIN D3) 2000 UNITS capsule Take 1 capsule (2,000 Units total) by mouth daily. 02/11/15   Tresa GarterAleksei V Plotnikov, MD  FLUAD 0.5 ML SUSY inject 0.5 milliliter intramuscularly 07/21/16   Historical Provider, MD  galantamine (RAZADYNE) 12 MG tablet Take 1 tablet (12 mg total) by mouth 2 (two) times daily. 03/16/16   Georgina QuintAleksei V Plotnikov, MD  hydrochlorothiazide (MICROZIDE) 12.5 MG capsule Take 1 capsule (12.5 mg total) by mouth daily. 03/24/16   Georgina QuintAleksei V Plotnikov, MD  ibuprofen (ADVIL,MOTRIN) 600 MG tablet Take 1 tablet (600 mg total) by mouth every 8 (eight) hours as needed for headache. 08/30/16   Tresa GarterAleksei V Plotnikov, MD  ibuprofen (ADVIL,MOTRIN) 600 MG tablet take 1 tablet by mouth every 8 hours if needed for headache 09/05/16   Tresa GarterAleksei V Plotnikov, MD  Lysine 500 MG CAPS 1 po qd 02/11/15   Tresa GarterAleksei V Plotnikov, MD  Multiple Vitamin (MULTI-VITAMIN PO) Take by mouth daily.    Historical Provider, MD  nitroGLYCERIN (NITROSTAT) 0.4  MG SL tablet Place 1 tablet (0.4 mg total) under the tongue every 5 (five) minutes as needed for chest pain. 12/27/12   Georgina QuintAleksei V Plotnikov, MD  polyethylene glycol powder (GLYCOLAX/MIRALAX) powder Take 17 g by mouth 2 (two) times daily as needed. 02/11/15   Tresa GarterAleksei V Plotnikov, MD  Probiotic Product (ULTRAFLORA IMMUNE HEALTH) 170 MG CAPS TAKE (1) CAPSULE DAILY. 04/28/16   Georgina QuintAleksei V Plotnikov, MD  traMADol Janean Sark(ULTRAM) 50 MG tablet take 1-2 tablets by mouth twice a day if needed for pain 09/05/16   Tresa GarterAleksei V Plotnikov, MD   Family History Family History  Problem  Relation Age of Onset  . Heart attack Father [id number]  . Breast cancer Sister   . Cervical cancer Sister   . Ovarian cancer Sister   . Heart attack Brother 2755   Social History Social History  Substance Use Topics  . Smoking status: Never Smoker  . Smokeless tobacco: Never Used  . Alcohol use No   Allergies   Benadryl [diphenhydramine hcl]; Crestor [rosuvastatin calcium]; and Shellfish allergy  Review of Systems Review of Systems  Constitutional: Negative for appetite change and fever.  HENT: Negative for congestion and trouble swallowing.   Eyes: Negative for visual disturbance.  Respiratory: Negative for cough and shortness of breath.   Cardiovascular: Negative for chest pain.  Gastrointestinal: Negative for abdominal pain, diarrhea, nausea and vomiting.  Musculoskeletal: Negative for myalgias.  Neurological: Positive for dizziness, weakness and headaches. Negative for facial asymmetry and speech difficulty.   Physical Exam Updated Vital Signs BP (!) 166/104 (BP Location: Left Arm)   Pulse 70   Temp 98.7 F (37.1 C) (Oral)   Resp 14   Ht 5\' 7"  (1.702 m)   Wt 178 lb 3 oz (80.8 kg)   SpO2 96%   BMI 27.91 kg/m   Physical Exam  Constitutional: She is oriented to person, place, and time. She appears well-developed and well-nourished.  HENT:  Head: Normocephalic.  Eyes: Conjunctivae are normal.  Cardiovascular: Normal rate, regular rhythm and normal heart sounds.   No carotid bruit  Pulmonary/Chest: Effort normal.  Abdominal: Soft. There is no tenderness.  Neurological: She is alert and oriented to person, place, and time. She has normal reflexes. She displays normal reflexes. No cranial nerve deficit. Coordination normal.  Speech is clear and focused. No coordination deficits. Reflexes are equal. Cranial nerves 3 - 12 are intact. No lateralizing weakness. Negative romberg, no pronator drift. Ambulation shows mild ataxia.  Skin: Skin is warm and dry.  Psychiatric: She has a  normal mood and affect.  Nursing note and vitals reviewed.  ED Treatments / Results  Labs (all labs ordered are listed, but only abnormal results are displayed) Labs Reviewed  CBC - Abnormal; Notable for the following:       Result Value   Hemoglobin 11.4 (*)    HCT 34.2 (*)    All other components within normal limits  I-STAT CHEM 8, ED - Abnormal; Notable for the following:    Hemoglobin 11.9 (*)    HCT 35.0 (*)    All other components within normal limits  DIFFERENTIAL  PROTIME-INR  APTT  COMPREHENSIVE METABOLIC PANEL  I-STAT TROPOININ, ED  CBG MONITORING, ED   EKG  EKG Interpretation None       Radiology No results found.  Procedures Procedures (including critical care time)  Medications Ordered in ED Medications - No data to display  Initial Impression / Assessment and Plan / ED Course  I  have reviewed the triage vital signs and the nursing notes.  Pertinent labs & imaging results that were available during my care of the patient were reviewed by me and considered in my medical decision making (see chart for details).  Clinical Course    Patient with constant headache, episodic dizziness and ataxia. She reports dizzy episodes today only, but also has recent fall with facial contusion raises concern for gait instability for the past week similar to mild ataxia tonight.   Neg labs, head CT. She is seen and evaluated by Dr. Nicanor Alcon and found to require MRI brain to evaluate for stroke. [facility]. Discussed with Dr. Roseanne Reno (neuro) and with Dr. Blinda Leatherwood, ED attending.   Final Clinical Impressions(s) / ED Diagnoses   Final diagnoses:  None   1. Headache 2. Ataxia 3. Dizzness.  New Prescriptions New Prescriptions   No medications on file   I personally performed the services described in this documentation, which was scribed in my presence. The recorded information has been reviewed and is accurate.      Elpidio Anis,  PA-C 09/09/16 0410    Cy Blamer, MD 09/09/16 [phone]

## 2016-09-09 NOTE — ED Notes (Signed)
Pt has been needing ambulation to the restroom; uneven gait noted.

## 2016-09-09 NOTE — ED Provider Notes (Signed)
[facility]-EMERGENCY DEPT Provider Note   CSN: [id number] Arrival date & time: 09/09/16  0034     History   Chief Complaint Chief Complaint  Patient presents with  . Dizziness  . Headache    HPI Shelley Bray is a 70 y.o. female.  HPI Patient transferred from was a long emergency department for MRI to rule out stroke. States that yesterday afternoon she began having bilateral upper extremity heaviness and "discomfort" this was associated with lightheadedness. It resolved after several minutes and lying down. She then had similar symptoms around midnight. Again this resolved after a short period of time. Denies any chest pain or shortness of breath. Denies visual changes or spinning sensation. Denies any voice changes. States she is currently asymptomatic. CT head without any acute findings. Patient states that roughly 1 week ago she fell and hit her head. She has a healing abrasion over her left eyebrow. She denies headache or neck pain. Past Medical History:  Diagnosis Date  . ANXIETY   . GERD   . HYPERLIPIDEMIA   . HYPERTENSION   . Memory loss    after son died in  2002  . SHELLFISH ALLERGY     Patient Active Problem List   Diagnosis Date Noted  . Well adult exam 02/28/2016  . Acute upper respiratory infection 06/24/2015  . Chest pain 12/27/2012  . GERD (gastroesophageal reflux disease) 12/27/2012  . Abdominal pain, other specified site 05/08/2012  . Grief at loss of child 05/08/2012  . ADD (attention deficit disorder) 04/25/2011  . Chronic fatigue 04/25/2011  . Dyslipidemia 01/17/2011  . Anxiety state 01/17/2011  . MILD COGNITIVE IMPAIRMENT SO STATED 01/17/2011  . Essential hypertension 01/17/2011  . FIBROCYSTIC BREAST DISEASE 01/17/2011  . SHELLFISH ALLERGY 01/17/2011    Past Surgical History:  Procedure Laterality Date  . TUBAL LIGATION      OB History    No data available       Home Medications    Prior to Admission medications   Medication Sig Start  Date End Date Taking? Authorizing Provider  ALPRAZolam [name](XANAX) 0.5 MG tablet Take 1 tablet (0.5 mg total) by mouth at bedtime. Patient taking differently: Take 0.5 mg by mouth at bedtime as needed for anxiety or sleep.  03/30/16  Yes Georgina QuintAleksei V Plotnikov, MD  aspirin EC 81 MG tablet Take 81 mg by mouth 3 (three) times a week.   Yes Historical Provider, MD  Chlorpheniramine-Acetaminophen (CORICIDIN HBP COLD/FLU PO) Take 1 tablet by mouth daily as needed (cold).   Yes Historical Provider, MD  Cholecalciferol (VITAMIN D3) 2000 UNITS capsule Take 1 capsule (2,000 Units total) by mouth daily. Patient taking differently: Take 2,000 Units by mouth 3 (three) times a week.  02/11/15  Yes Tresa GarterAleksei V Plotnikov, MD  galantamine (RAZADYNE) 12 MG tablet Take 1 tablet (12 mg total) by mouth 2 (two) times daily. 03/16/16  Yes Tresa GarterAleksei V Plotnikov, MD  Homeopathic Products [facility](ZICAM ALLERGY RELIEF NA) Place 1 spray into the nose every 8 (eight) hours as needed (allergies,cough).   Yes Historical Provider, MD  hydrochlorothiazide (MICROZIDE) 12.5 MG capsule Take 1 capsule (12.5 mg total) by mouth daily. Patient taking differently: Take 12.5 mg by mouth daily as needed (fluid).  03/24/16  Yes Georgina QuintAleksei V Plotnikov, MD  ibuprofen (ADVIL,MOTRIN) 600 MG tablet Take 1 tablet (600 mg total) by mouth every 8 (eight) hours as needed for headache. 08/30/16  Yes Georgina QuintAleksei V Plotnikov, MD  Lysine 500 MG CAPS 1 po qd Patient taking  differently: Take 1 capsule by mouth every morning. 1 po qd 02/11/15  Yes Tresa GarterAleksei V Plotnikov, MD  Multiple Vitamin (MULTI-VITAMIN PO) Take 1 tablet by mouth every morning.    Yes Historical Provider, MD  nitroGLYCERIN (NITROSTAT) 0.4 MG SL tablet Place 1 tablet (0.4 mg total) under the tongue every 5 (five) minutes as needed for chest pain. 12/27/12  Yes Georgina QuintAleksei V Plotnikov, MD  polyethylene glycol powder (GLYCOLAX/MIRALAX) powder Take 17 g by mouth 2 (two) times daily as needed. Patient taking differently: Take 17 g  by mouth 2 (two) times daily as needed for mild constipation or moderate constipation.  02/11/15  Yes Tresa GarterAleksei V Plotnikov, MD  Probiotic Product (ULTRAFLORA IMMUNE HEALTH) 170 MG CAPS TAKE (1) CAPSULE DAILY. 04/28/16  Yes Tresa GarterAleksei V Plotnikov, MD  traMADol (ULTRAM) 50 MG tablet take 1-2 tablets by mouth twice a day if needed for pain Patient taking differently: TAKE 25-50 mg by mouth as needed for pain 09/05/16  Yes Tresa GarterAleksei V Plotnikov, MD    Family History Family History  Problem Relation Age of Onset  . Heart attack Father 2565  . Breast cancer Sister   . Cervical cancer Sister   . Ovarian cancer Sister   . Heart attack Brother [id number]    Social History Social History  Substance Use Topics  . Smoking status: Never Smoker  . Smokeless tobacco: Never Used  . Alcohol use No     Allergies   Benadryl [diphenhydramine hcl]; Crestor [rosuvastatin calcium]; and Shellfish allergy   Review of Systems Review of Systems  Constitutional: Negative for chills and fever.  HENT: Negative for facial swelling.   Eyes: Negative for visual disturbance.  Respiratory: Negative for shortness of breath.   Cardiovascular: Negative for chest pain.  Gastrointestinal: Negative for abdominal pain, diarrhea, nausea and vomiting.  Musculoskeletal: Negative for back pain, neck pain and neck stiffness.  Skin: Negative for rash and wound.  Neurological: Positive for weakness and light-headedness. Negative for dizziness, syncope, facial asymmetry, speech difficulty and headaches.  All other systems reviewed and are negative.    Physical Exam Updated Vital Signs BP 149/75   Pulse 72   Temp 98.4 F (36.9 C) (Oral)   Resp 19   Ht 5\' 7"  (1.702 m)   Wt 178 lb 3 oz (80.8 kg)   SpO2 99%   BMI 27.91 kg/m   Physical Exam  Constitutional: She is oriented to person, place, and time. She appears well-developed and well-nourished.  HENT:  Head: Normocephalic and atraumatic.  Mouth/Throat: Oropharynx is clear and  moist.  Healing abrasion of the left eyebrow  Eyes: EOM are normal. Pupils are equal, round, and reactive to light.  Few beats of horizontal nystagmus  Neck: Normal range of motion. Neck supple.  No posterior midline cervical tenderness to palpation.  Cardiovascular: Normal rate and regular rhythm.  Exam reveals no gallop and no friction rub.   No murmur heard. Pulmonary/Chest: Effort normal and breath sounds normal. No respiratory distress. She has no wheezes. She has no rales. She exhibits no tenderness.  Abdominal: Soft. Bowel sounds are normal. There is no tenderness. There is no rebound and no guarding.  Musculoskeletal: Normal range of motion. She exhibits no edema or tenderness.  2+ distal pulses in all extremities. No lower extremity swelling, asymmetry or tenderness.  Neurological: She is alert and oriented to person, place, and time.  Patient is alert and oriented x3 with clear, goal oriented speech. Patient has 5/5 motor in all extremities. Sensation  is intact to light touch. Bilateral finger-to-nose is normal with no signs of dysmetria.  Skin: Skin is warm and dry. Capillary refill takes less than 2 seconds. No rash noted. No erythema.  Psychiatric: She has a normal mood and affect. Her behavior is normal.  Nursing note and vitals reviewed.    ED Treatments / Results  Labs (all labs ordered are listed, but only abnormal results are displayed) Labs Reviewed  CBC - Abnormal; Notable for the following:       Result Value   Hemoglobin 11.4 (*)    HCT 34.2 (*)    All other components within normal limits  URINALYSIS, ROUTINE W REFLEX MICROSCOPIC (NOT AT [facility]) - Abnormal; Notable for the following:    APPearance CLOUDY (*)    Leukocytes, UA LARGE (*)    All other components within normal limits  URINE MICROSCOPIC-ADD ON - Abnormal; Notable for the following:    Squamous Epithelial / LPF 0-5 (*)    Bacteria, UA RARE (*)    All other components within normal limits  CBG  MONITORING, ED - Abnormal; Notable for the following:    Glucose-Capillary 102 (*)    All other components within normal limits  I-STAT CHEM 8, ED - Abnormal; Notable for the following:    Hemoglobin 11.9 (*)    HCT 35.0 (*)    All other components within normal limits  PROTIME-INR  APTT  DIFFERENTIAL  COMPREHENSIVE METABOLIC PANEL  I-STAT TROPOININ, ED    EKG  EKG Interpretation  Date/Time:  Saturday September 09 2016 01:00:58 EDT Ventricular Rate:  71 PR Interval:    QRS Duration: 92 QT Interval:  395 QTC Calculation: 430 R Axis:   57 Text Interpretation:  Sinus rhythm Confirmed by [facility]  MD, APRIL ([id number]) on 09/09/2016 1:42:00 AM       Radiology Ct Head Wo Contrast  Result Date: 09/09/2016 CLINICAL DATA:  Lightheadedness this afternoon, progressing this evening to palpitations, headache and intermittent upper extremity paresthesias. EXAM: CT HEAD WITHOUT CONTRAST TECHNIQUE: Contiguous axial images were obtained from the base of the skull through the vertex without intravenous contrast. COMPARISON:  02/26/2010 FINDINGS: Brain: No evidence of acute infarction, hemorrhage, hydrocephalus, extra-axial collection or mass lesion/mass effect. Gray matter and white matter are unremarkable, with normal differentiation. Brain volume is normal for age. Vascular: No hyperdense vessel or unexpected calcification. Skull: Normal. Negative for fracture or focal lesion. Sinuses/Orbits: No acute finding. Other: None. IMPRESSION: Normal brain Electronically Signed   By: Ellery Plunk M.D.   On: 09/09/2016 01:57   Mr Brain Wo Contrast  Result Date: 09/09/2016 CLINICAL DATA:  Bilateral arm weakness and numbness. Dizziness and lightheadedness. EXAM: MRI HEAD WITHOUT CONTRAST TECHNIQUE: Multiplanar, multiecho pulse sequences of the brain and surrounding structures were obtained without intravenous contrast. COMPARISON:  Head CT 09/09/2016 FINDINGS: Brain: There is no evidence of acute  infarct, intracranial hemorrhage, mass, midline shift, or extra-axial fluid collection. The ventricles and sulci are normal. Small foci of T2 hyperintensity scattered throughout the cerebral white matter are nonspecific but compatible with chronic small vessel ischemic disease, mild for age. Punctate T2 hyperintense focus in the right cerebral peduncle, favored to reflect a dilated perivascular space rather than chronic infarct. Vascular: Major intracranial vascular flow voids are preserved. Skull and upper cervical spine: Unremarkable bone marrow signal. Hyperostosis frontalis. Sinuses/Orbits: Unremarkable orbits.  Clear sinuses. Other: None. IMPRESSION: 1. No acute intracranial abnormality. 2. Mild chronic small vessel ischemic disease. Electronically Signed   By: Freida Busman  Mosetta Putt M.D.   On: 09/09/2016 11:41   Mr Cervical Spine Wo Contrast  Result Date: 09/09/2016 CLINICAL DATA:  Bilateral arm weakness and numbness. Dizziness and lightheadedness. EXAM: MRI CERVICAL SPINE WITHOUT CONTRAST TECHNIQUE: Multiplanar, multisequence MR imaging of the cervical spine was performed. No intravenous contrast was administered. COMPARISON:  None. FINDINGS: Study is mildly motion degraded. Alignment: Cervical spine straightening.  No listhesis. Vertebrae: Preserved vertebral body heights without evidence of fracture or osseous lesion. Mild degenerative endplate changes from C3-C6. Cord: Normal signal and morphology. Posterior Fossa, vertebral arteries, paraspinal tissues: Unremarkable aside from incidental bilateral perineural cysts in the lower cervical and upper thoracic spine. Disc levels:  Mild-to-moderate disc space narrowing C3-4 to C6-7. C2-3:  Negative. C3-4: Disc bulging and uncovertebral spurring without significant stenosis. C4-5: Disc bulging and uncovertebral spurring result in mild right neural foraminal stenosis without spinal stenosis. C5-6: Disc bulging and uncovertebral spurring result in mild right greater than  left neural foraminal stenosis without spinal stenosis. C6-7: Disc bulging asymmetric to the left and uncovertebral spurring result in at most mild left neural foraminal narrowing without spinal stenosis. C7-T1:  At most minimal disc bulging without stenosis. IMPRESSION: Mild multilevel disc degeneration with mild neural foraminal narrowing as above. No spinal stenosis. Electronically Signed   By: Sebastian Ache M.D.   On: 09/09/2016 11:48    Procedures Procedures (including critical care time)  Medications Ordered in ED Medications  sodium chloride 0.9 % bolus 500 mL (0 mLs Intravenous Stopped 09/09/16 0747)     Initial Impression / Assessment and Plan / ED Course  I have reviewed the triage vital signs and the nursing notes.  Pertinent labs & imaging results that were available during my care of the patient were reviewed by me and considered in my medical decision making (see chart for details).  Clinical Course   We'll get MR brain as well as MR cervical spine to rule out central cord syndrome, given bilateral nature of the patient's symptoms. MR brain without acute findings. Disc bulging of the cervical spine. Questionable radiculopathy. Advised follow-up with neurosurgery. Patient ambulating without any difficulty. She has a normal neurologic exam with bilateral equal grip strength and sensation completely intact. Return precautions given. Final Clinical Impressions(s) / ED Diagnoses   Final diagnoses:  Ataxia  Acute nonintractable headache, unspecified headache type  Weakness of both arms    New Prescriptions New Prescriptions   No medications on file     Loren Racer, MD 09/09/16 1220

## 2016-09-09 NOTE — ED Notes (Signed)
Patient transported to MRI 

## 2016-09-09 NOTE — ED Notes (Signed)
Patient transported to CT 

## 2016-09-09 NOTE — ED Triage Notes (Signed)
Pt states this afternoon she began to feel dizzy. That progressed this evening into heart palpitations, headache, and intermittent bilateral arm numbness. No facial droop. Equal grip bilaterally. A&Ox4. Ambulatory.

## 2017-02-27 NOTE — Progress Notes (Signed)
Subjective:   Shelley Bray is a 71 y.o. female who presents for Medicare Annual (Subsequent) preventive examination.  Review of Systems:  No ROS.  Medicare Wellness Visit.  Cardiac Risk Factors include: advanced age (>10men, >62 women);dyslipidemia;hypertension;family history of premature cardiovascular disease Sleep patterns: no sleep issues, feels rested on waking, gets up 1 times nightly to void and sleeps 7-8 hours nightly.   Home Safety/Smoke Alarms: Feels safe in home. Smoke alarms in place.     Living environment; residence and Firearm Safety: 2-story house, no firearms. Lives with husband, no DME needs at this time Seat Belt Safety/Bike Helmet: Wears seat belt.   Counseling:   Eye Exam- appointment yearly Dental- appointment every 6 months  Female:   Pap- N/A      Mammo- Last 12/15/10, Bi-Rads Category 1: Negative      Dexa scan- Per patient within the last year,signed release form to obtain record       CCS- Per patient within the last year, signed release form to obtain record     Objective:     Vitals: BP (!) 158/92 (BP Location: Left Arm, Patient Position: Sitting, Cuff Size: Normal)   Pulse 81   Temp 98.4 F (36.9 C) (Oral)   Ht  (1.702 m)   Wt 191 lb 1.9 oz (86.7 kg)   SpO2 99%   BMI 29.93 kg/m   Body mass index is 29.93 kg/m.   Tobacco History  Smoking Status  . Never Smoker  Smokeless Tobacco  . Never Used     Counseling given: Not Answered   Past Medical History:  Diagnosis Date  . ANXIETY   . GERD   . HYPERLIPIDEMIA   . HYPERTENSION   . Memory loss    after son died in  03-08-2001  . SHELLFISH ALLERGY    Past Surgical History:  Procedure Laterality Date  . TUBAL LIGATION     Family History  Problem Relation Age of Onset  . Heart attack Father 74  . Breast cancer Sister   . Cervical cancer Sister   . Ovarian cancer Sister   . Heart attack Brother 77   History  Sexual Activity  . Sexual activity: Not on file    Outpatient  Encounter Prescriptions as of 02/28/2017  Medication Sig  . ALPRAZolam (XANAX) 0.5 MG tablet Take 1 tablet (0.5 mg total) by mouth at bedtime as needed for anxiety or sleep.  Marland Kitchen aspirin EC 81 MG tablet Take 81 mg by mouth 3 (three) times a week.  . Chlorpheniramine-Acetaminophen (CORICIDIN HBP COLD/FLU PO) Take 1 tablet by mouth daily as needed (cold).  . Cholecalciferol (VITAMIN D3) 2000 UNITS capsule Take 1 capsule (2,000 Units total) by mouth daily. (Patient taking differently: Take 2,000 Units by mouth 3 (three) times a week. )  . galantamine (RAZADYNE) 12 MG tablet Take 1 tablet (12 mg total) by mouth 2 (two) times daily.  . Homeopathic Products (ZICAM ALLERGY RELIEF NA) Place 1 spray into the nose every 8 (eight) hours as needed (allergies,cough).  . hydrochlorothiazide (MICROZIDE) 12.5 MG capsule Take 1 capsule (12.5 mg total) by mouth daily. (Patient taking differently: Take 12.5 mg by mouth daily as needed (fluid). )  . ibuprofen (ADVIL,MOTRIN) 600 MG tablet Take 1 tablet (600 mg total) by mouth every 8 (eight) hours as needed for headache.  . Lysine 1000 MG TABS Take 1 tablet (1,000 mg total) by mouth daily.  . Multiple Vitamin (MULTI-VITAMIN PO) Take 1 tablet by  mouth every morning.   . polyethylene glycol powder (GLYCOLAX/MIRALAX) powder Take 17 g by mouth 2 (two) times daily as needed.  . Probiotic Product (ULTRAFLORA IMMUNE HEALTH) 170 MG CAPS TAKE (1) CAPSULE DAILY.  . traMADol (ULTRAM) 50 MG tablet take 1-2 tablets by mouth twice a day if needed for pain (Patient taking differently: TAKE 25-50 mg by mouth as needed for pain)  . [DISCONTINUED] ALPRAZolam (XANAX) 0.5 MG tablet Take 1 tablet (0.5 mg total) by mouth at bedtime. (Patient taking differently: Take 0.5 mg by mouth at bedtime as needed for anxiety or sleep. )  . [DISCONTINUED] galantamine (RAZADYNE) 12 MG tablet Take 1 tablet (12 mg total) by mouth 2 (two) times daily.  . [DISCONTINUED] ibuprofen (ADVIL,MOTRIN) 600 MG tablet  Take 1 tablet (600 mg total) by mouth every 8 (eight) hours as needed for headache.  . [DISCONTINUED] Lysine 500 MG CAPS 1 po qd (Patient taking differently: Take 1 capsule by mouth every morning. 1 po qd)  . [DISCONTINUED] nitroGLYCERIN (NITROSTAT) 0.4 MG SL tablet Place 1 tablet (0.4 mg total) under the tongue every 5 (five) minutes as needed for chest pain.  . [DISCONTINUED] polyethylene glycol powder (GLYCOLAX/MIRALAX) powder Take 17 g by mouth 2 (two) times daily as needed. (Patient taking differently: Take 17 g by mouth 2 (two) times daily as needed for mild constipation or moderate constipation. )   Facility-Administered Encounter Medications as of 02/28/2017  Medication  . promethazine (PHENERGAN) injection 50 mg    Activities of Daily Living In your present state of health, do you have any difficulty performing the following activities: 02/28/2017  Hearing? N  Vision? N  Difficulty concentrating or making decisions? N  Walking or climbing stairs? N  Dressing or bathing? N  Doing errands, shopping? N  Preparing Food and eating ? N  Using the Toilet? N  In the past six months, have you accidently leaked urine? N  Do you have problems with loss of bowel control? N  Managing your Medications? N  Managing your Finances? N  Housekeeping or managing your Housekeeping? N  Some recent data might be hidden    Patient Care Team: Tresa Garter, MD as PCP - General (Internal Medicine) Charna Elizabeth, MD as Attending Physician (Gastroenterology) Kathleene Hazel, MD as Attending Physician (Cardiology)    Assessment:    Physical assessment deferred to PCP.  Exercise Activities and Dietary recommendations Current Exercise Habits: Home exercise routine, Type of exercise: calisthenics;stretching;walking, Time (Minutes): 45, Frequency (Times/Week): 3, Weekly Exercise (Minutes/Week): 135, Intensity: Mild, Exercise limited by: None identified  Diet (meal preparation, eat out, water  intake, caffeinated beverages, dairy products, fruits and vegetables): in general, a "healthy" diet  , well balanced, on average, 2 meals per day, on average, 1-2 servings fruit per day, on average, 2-3 servings vegetables per day, low fat/ cholesterol, low salt Patient reports she has found diet plan that is working for her which has encouraged her to make a diet life-style change. Drinks 8-10 glasses of water daily, limits caffeine, soda, and sugar.   Goals    . lose 10 pounds          Continue to exercise, eat healthy, and enjoy life    . Weight < 180 lb (81.647 kg)          Will exercise a little more;  Work slow at this goal    . Weight < 180 lb (81.647 kg)  Next year; she will be at her weight; will make gradual changes;  Will watch sodium intake         Fall Risk Fall Risk  02/28/2017 02/28/2016 02/18/2015  Falls in the past year? No No Yes  Number falls in past yr: - - 1  Injury with Fall? - - No   Depression Screen PHQ 2/9 Scores 02/28/2017 02/28/2017 02/28/2016 02/18/2015  PHQ - 2 Score 0 0 0 0  PHQ- 9 Score 0 - - -     Cognitive Function MMSE - Mini Mental State Exam 02/28/2016  Not completed: (No Data)  Ad8 score reviewed for issues:  Issues making decisions: no  Less interest in hobbies / activities: no  Repeats questions, stories (family complaining): no  Trouble using ordinary gadgets (microwave, computer, phone): no  Forgets the month or year: no  Mismanaging finances: no  Remembering appts: no  Daily problems with thinking and/or memory: no Ad8 score is= 0      Immunization History  Administered Date(s) Administered  . Influenza, High Dose Seasonal PF 07/31/2014, 08/02/2015  . Influenza,inj,Quad PF,36+ Mos 07/21/2016  . Influenza-Unspecified 08/06/2012, 08/13/2013  . Pneumococcal Conjugate-13 02/28/2016  . Zoster 01/17/2011   Screening Tests Health Maintenance  Topic Date Due  . PNA vac Low Risk Adult (2 of 2 - PPSV23) 02/27/2017    . Hepatitis C Screening  04/06/2017 (Originally December 27, 1945)  . TETANUS/TDAP  02/04/2018 (Originally 08[birth year]06/1963)  . DEXA SCAN  02/28/2018 (Originally 08/05/2009)  . MAMMOGRAM  11/05/2018 (Originally 11/06/2016)  . COLONOSCOPY  03/01/2019 (Originally 08/05/1994)  . INFLUENZA VACCINE  06/06/2017      Plan:     Continue to eat heart healthy diet (full of fruits, vegetables, whole grains, lean protein, water--limit salt, fat, and sugar intake) and increase physical activity as tolerated.  Continue doing brain stimulating activities (puzzles, reading, adult coloring books, staying active) to keep memory sharp.   During the course of the visit the patient was educated and counseled about the following appropriate screening and preventive services:   Vaccines to include Pneumoccal, Influenza, Hepatitis B, Td, Zostavax, HCV  Cardiovascular Disease  Colorectal cancer screening  Bone density screening  Diabetes screening  Glaucoma screening  Mammography/PAP  Nutrition counseling   Patient Instructions (the written plan) was given to the patient.   Wanda Plump, RN  02/28/2017  Medical screening examination/treatment/procedure(s) were performed by non-physician practitioner and as supervising physician I was immediately available for consultation/collaboration. I agree with above. Sonda Primes, MD

## 2017-02-27 NOTE — Progress Notes (Signed)
Pre visit review using our clinic review tool, if applicable. No additional management support is needed unless otherwise documented below in the visit note. 

## 2017-02-28 ENCOUNTER — Ambulatory Visit (INDEPENDENT_AMBULATORY_CARE_PROVIDER_SITE_OTHER): Payer: Medicare Other | Admitting: Internal Medicine

## 2017-02-28 ENCOUNTER — Encounter: Payer: Self-pay | Admitting: Internal Medicine

## 2017-02-28 VITALS — BP 158/92 | HR 81 | Temp 98.4°F | Ht 67.0 in | Wt 191.1 lb

## 2017-02-28 DIAGNOSIS — K219 Gastro-esophageal reflux disease without esophagitis: Secondary | ICD-10-CM | POA: Diagnosis not present

## 2017-02-28 DIAGNOSIS — I10 Essential (primary) hypertension: Secondary | ICD-10-CM | POA: Diagnosis not present

## 2017-02-28 DIAGNOSIS — G3184 Mild cognitive impairment, so stated: Secondary | ICD-10-CM | POA: Diagnosis not present

## 2017-02-28 DIAGNOSIS — Z0000 Encounter for general adult medical examination without abnormal findings: Secondary | ICD-10-CM

## 2017-02-28 DIAGNOSIS — E785 Hyperlipidemia, unspecified: Secondary | ICD-10-CM

## 2017-02-28 DIAGNOSIS — F411 Generalized anxiety disorder: Secondary | ICD-10-CM

## 2017-02-28 DIAGNOSIS — R5382 Chronic fatigue, unspecified: Secondary | ICD-10-CM

## 2017-02-28 DIAGNOSIS — F988 Other specified behavioral and emotional disorders with onset usually occurring in childhood and adolescence: Secondary | ICD-10-CM

## 2017-02-28 MED ORDER — GALANTAMINE HYDROBROMIDE 12 MG PO TABS: 12.0000 mg | ORAL_TABLET | Freq: Two times a day (BID) | ORAL | 3 refills | Status: DC

## 2017-02-28 MED ORDER — IBUPROFEN 600 MG PO TABS: 600.0000 mg | ORAL_TABLET | Freq: Three times a day (TID) | ORAL | 1 refills | Status: AC | PRN

## 2017-02-28 MED ORDER — ALPRAZOLAM 0.5 MG PO TABS: 0.5000 mg | ORAL_TABLET | Freq: Every evening | ORAL | 1 refills | Status: DC | PRN

## 2017-02-28 MED ORDER — POLYETHYLENE GLYCOL 3350 17 GM/SCOOP PO POWD: 17.0000 g | Freq: Two times a day (BID) | ORAL | 3 refills | Status: AC | PRN

## 2017-02-28 MED ORDER — LYSINE 1000 MG PO TABS: 1.0000 | ORAL_TABLET | Freq: Every day | ORAL | 3 refills | Status: AC

## 2017-02-28 NOTE — Assessment & Plan Note (Signed)
Xanax prn 

## 2017-02-28 NOTE — Assessment & Plan Note (Addendum)
Shelley Bray

## 2017-02-28 NOTE — Assessment & Plan Note (Signed)
HCTZ 

## 2017-02-28 NOTE — Patient Instructions (Addendum)
Continue to eat heart healthy diet (full of fruits, vegetables, whole grains, lean protein, water--limit salt, fat, and sugar intake) and increase physical activity as tolerated.  Continue doing brain stimulating activities (puzzles, reading, adult coloring books, staying active) to keep memory sharp.   Shelley Bray , Thank you for taking time to come for your Medicare Wellness Visit. I appreciate your ongoing commitment to your health goals. Please review the following plan we discussed and let me know if I can assist you in the future.   These are the goals we discussed: Goals    . lose 10 pounds          Continue to exercise, eat healthy, and enjoy life    . Weight < 180 lb (81.647 kg)          Will exercise a little more;  Work slow at this goal    . Weight < 180 lb (81.647 kg)          Next year; she will be at her weight; will make gradual changes;  Will watch sodium intake          This is a list of the screening recommended for you and due dates:  Health Maintenance  Topic Date Due  . Pneumonia vaccines (2 of 2 - PPSV23) 02/27/2017  .  Hepatitis C: One time screening is recommended by Center for Disease Control  (CDC) for  adults born from [birth year] through 1967.   04/06/2017*  . Tetanus Vaccine  02/04/2018*  . DEXA scan (bone density measurement)  02/28/2018*  . Mammogram  11/05/2018*  . Colon Cancer Screening  03/01/2019*  . Flu Shot  06/06/2017  *Topic was postponed. The date shown is not the original due date.

## 2017-03-14 ENCOUNTER — Encounter: Payer: Self-pay | Admitting: Internal Medicine

## 2017-03-14 NOTE — Assessment & Plan Note (Signed)
Prn meds 

## 2017-03-14 NOTE — Progress Notes (Signed)
Subjective:  Patient ID: Shelley Bray, female    DOB: 1946/11/03  Age: 71 y.o. MRN: [id number]  CC: Medicare Wellness   HPI Shelley Bray presents for HTN, cognitive issues, anxiety f/u  Outpatient Medications Prior to Visit  Medication Sig Dispense Refill  . aspirin EC 81 MG tablet Take 81 mg by mouth 3 (three) times a week.    . Chlorpheniramine-Acetaminophen (CORICIDIN HBP COLD/FLU PO) Take 1 tablet by mouth daily as needed (cold).    . Cholecalciferol (VITAMIN D3) 2000 UNITS capsule Take 1 capsule (2,000 Units total) by mouth daily. (Patient taking differently: Take 2,000 Units by mouth 3 (three) times a week. ) 100 capsule 3  . Homeopathic Products (ZICAM ALLERGY RELIEF NA) Place 1 spray into the nose every 8 (eight) hours as needed (allergies,cough).    . hydrochlorothiazide (MICROZIDE) 12.5 MG capsule Take 1 capsule (12.5 mg total) by mouth daily. (Patient taking differently: Take 12.5 mg by mouth daily as needed (fluid). ) 90 capsule 3  . Multiple Vitamin (MULTI-VITAMIN PO) Take 1 tablet by mouth every morning.     . Probiotic Product (ULTRAFLORA IMMUNE HEALTH) 170 MG CAPS TAKE (1) CAPSULE DAILY. 60 capsule 5  . traMADol (ULTRAM) 50 MG tablet take 1-2 tablets by mouth twice a day if needed for pain (Patient taking differently: TAKE 25-50 mg by mouth as needed for pain) 60 tablet 1  . ALPRAZolam (XANAX) 0.5 MG tablet Take 1 tablet (0.5 mg total) by mouth at bedtime. (Patient taking differently: Take 0.5 mg by mouth at bedtime as needed for anxiety or sleep. ) 90 tablet 3  . galantamine (RAZADYNE) 12 MG tablet Take 1 tablet (12 mg total) by mouth 2 (two) times daily. 180 tablet 3  . ibuprofen (ADVIL,MOTRIN) 600 MG tablet Take 1 tablet (600 mg total) by mouth every 8 (eight) hours as needed for headache. 60 tablet 1  . Lysine 500 MG CAPS 1 po qd (Patient taking differently: Take 1 capsule by mouth every morning. 1 po qd) 100 capsule 3  . nitroGLYCERIN (NITROSTAT) 0.4 MG SL tablet  Place 1 tablet (0.4 mg total) under the tongue every 5 (five) minutes as needed for chest pain. 20 tablet 0  . polyethylene glycol powder (GLYCOLAX/MIRALAX) powder Take 17 g by mouth 2 (two) times daily as needed. (Patient taking differently: Take 17 g by mouth 2 (two) times daily as needed for mild constipation or moderate constipation. ) 500 g 3   Facility-Administered Medications Prior to Visit  Medication Dose Route Frequency Provider Last Rate Last Dose  . promethazine (PHENERGAN) injection 50 mg  50 mg Intramuscular Q6H PRN Keyasia Jolliff, Georgina Quint, MD   50 mg at 05/08/12 1106    ROS Review of Systems  Constitutional: Negative for activity change, appetite change, chills, fatigue and unexpected weight change.  HENT: Negative for congestion, mouth sores and sinus pressure.   Eyes: Negative for visual disturbance.  Respiratory: Negative for cough and chest tightness.   Gastrointestinal: Negative for abdominal pain and nausea.  Genitourinary: Negative for difficulty urinating, frequency and vaginal pain.  Musculoskeletal: Negative for back pain and gait problem.  Skin: Negative for pallor and rash.  Neurological: Negative for dizziness, tremors, weakness, numbness and headaches.  Psychiatric/Behavioral: Positive for decreased concentration. Negative for confusion, sleep disturbance and suicidal ideas. The patient is nervous/anxious.     Objective:  BP (!) 158/92 (BP Location: Left Arm, Patient Position: Sitting, Cuff Size: Normal)   Pulse 81   Temp 98.4  F (36.9 C) (Oral)   Ht 5\' 7"  (1.702 m)   Wt 191 lb 1.9 oz (86.7 kg)   SpO2 99%   BMI 29.93 kg/m   BP Readings from Last 3 Encounters:  02/28/17 (!) 158/92  09/09/16 157/90  08/30/16 (!) 138/92    Wt Readings from Last 3 Encounters:  02/28/17 191 lb 1.9 oz (86.7 kg)  09/09/16 178 lb 3 oz (80.8 kg)  08/30/16 196 lb (88.9 kg)    Physical Exam  Constitutional: She appears well-developed. No distress.  HENT:  Head:  Normocephalic.  Right Ear: External ear normal.  Left Ear: External ear normal.  Nose: Nose normal.  Mouth/Throat: Oropharynx is clear and moist.  Eyes: Conjunctivae are normal. Pupils are equal, round, and reactive to light. Right eye exhibits no discharge. Left eye exhibits no discharge.  Neck: Normal range of motion. Neck supple. No JVD present. No tracheal deviation present. No thyromegaly present.  Cardiovascular: Normal rate, regular rhythm and normal heart sounds.   Pulmonary/Chest: No stridor. No respiratory distress. She has no wheezes.  Abdominal: Soft. Bowel sounds are normal. She exhibits no distension and no mass. There is no tenderness. There is no rebound and no guarding.  Musculoskeletal: She exhibits no edema or tenderness.  Lymphadenopathy:    She has no cervical adenopathy.  Neurological: She displays normal reflexes. No cranial nerve deficit. She exhibits normal muscle tone. Coordination normal.  Skin: No rash noted. No erythema.  Psychiatric: She has a normal mood and affect. Her behavior is normal. Judgment and thought content normal.    Lab Results  Component Value Date   WBC 7.2 09/09/2016   HGB 11.9 (L) 09/09/2016   HCT 35.0 (L) 09/09/2016   PLT 265 09/09/2016   GLUCOSE 97 09/09/2016   CHOL 204 (H) 02/29/2016   TRIG 158.0 (H) 02/29/2016   HDL 49.00 02/29/2016   LDLDIRECT 152.9 10/14/2012   LDLCALC 124 (H) 02/29/2016   ALT 16 09/09/2016   AST 21 09/09/2016   NA 140 09/09/2016   K 3.8 09/09/2016   CL 107 09/09/2016   CREATININE 0.90 09/09/2016   BUN 16 09/09/2016   CO2 24 09/09/2016   TSH 3.79 02/29/2016   INR 0.98 09/09/2016    Ct Head Wo Contrast  Result Date: 09/09/2016 CLINICAL DATA:  Lightheadedness this afternoon, progressing this evening to palpitations, headache and intermittent upper extremity paresthesias. EXAM: CT HEAD WITHOUT CONTRAST TECHNIQUE: Contiguous axial images were obtained from the base of the skull through the vertex without  intravenous contrast. COMPARISON:  02/26/2010 FINDINGS: Brain: No evidence of acute infarction, hemorrhage, hydrocephalus, extra-axial collection or mass lesion/mass effect. Gray matter and white matter are unremarkable, with normal differentiation. Brain volume is normal for age. Vascular: No hyperdense vessel or unexpected calcification. Skull: Normal. Negative for fracture or focal lesion. Sinuses/Orbits: No acute finding. Other: None. IMPRESSION: Normal brain Electronically Signed   By: Ellery Plunk M.D.   On: 09/09/2016 01:57   Mr Brain Wo Contrast  Result Date: 09/09/2016 CLINICAL DATA:  Bilateral arm weakness and numbness. Dizziness and lightheadedness. EXAM: MRI HEAD WITHOUT CONTRAST TECHNIQUE: Multiplanar, multiecho pulse sequences of the brain and surrounding structures were obtained without intravenous contrast. COMPARISON:  Head CT 09/09/2016 FINDINGS: Brain: There is no evidence of acute infarct, intracranial hemorrhage, mass, midline shift, or extra-axial fluid collection. The ventricles and sulci are normal. Small foci of T2 hyperintensity scattered throughout the cerebral white matter are nonspecific but compatible with chronic small vessel ischemic disease, mild for  age. Punctate T2 hyperintense focus in the right cerebral peduncle, favored to reflect a dilated perivascular space rather than chronic infarct. Vascular: Major intracranial vascular flow voids are preserved. Skull and upper cervical spine: Unremarkable bone marrow signal. Hyperostosis frontalis. Sinuses/Orbits: Unremarkable orbits.  Clear sinuses. Other: None. IMPRESSION: 1. No acute intracranial abnormality. 2. Mild chronic small vessel ischemic disease. Electronically Signed   By: Sebastian Ache M.D.   On: 09/09/2016 11:41   Mr Cervical Spine Wo Contrast  Result Date: 09/09/2016 CLINICAL DATA:  Bilateral arm weakness and numbness. Dizziness and lightheadedness. EXAM: MRI CERVICAL SPINE WITHOUT CONTRAST TECHNIQUE:  Multiplanar, multisequence MR imaging of the cervical spine was performed. No intravenous contrast was administered. COMPARISON:  None. FINDINGS: Study is mildly motion degraded. Alignment: Cervical spine straightening.  No listhesis. Vertebrae: Preserved vertebral body heights without evidence of fracture or osseous lesion. Mild degenerative endplate changes from C3-C6. Cord: Normal signal and morphology. Posterior Fossa, vertebral arteries, paraspinal tissues: Unremarkable aside from incidental bilateral perineural cysts in the lower cervical and upper thoracic spine. Disc levels:  Mild-to-moderate disc space narrowing C3-4 to C6-7. C2-3:  Negative. C3-4: Disc bulging and uncovertebral spurring without significant stenosis. C4-5: Disc bulging and uncovertebral spurring result in mild right neural foraminal stenosis without spinal stenosis. C5-6: Disc bulging and uncovertebral spurring result in mild right greater than left neural foraminal stenosis without spinal stenosis. C6-7: Disc bulging asymmetric to the left and uncovertebral spurring result in at most mild left neural foraminal narrowing without spinal stenosis. C7-T1:  At most minimal disc bulging without stenosis. IMPRESSION: Mild multilevel disc degeneration with mild neural foraminal narrowing as above. No spinal stenosis. Electronically Signed   By: Sebastian Ache M.D.   On: 09/09/2016 11:48    Assessment & Plan:   Shelley Bray was seen today for medicare wellness.  Diagnoses and all orders for this visit:  Well adult exam -     Basic metabolic panel; Future -     CBC with Differential/Platelet; Future -     Hepatic function panel; Future -     Lipid panel; Future -     TSH; Future -     Urinalysis; Future  MILD COGNITIVE IMPAIRMENT SO STATED -     CBC with Differential/Platelet; Future  Essential hypertension -     Basic metabolic panel; Future -     CBC with Differential/Platelet; Future -     Hepatic function panel; Future  Anxiety  state -     Basic metabolic panel; Future -     CBC with Differential/Platelet; Future -     Hepatic function panel; Future -     Lipid panel; Future -     TSH; Future -     Urinalysis; Future  Dyslipidemia -     CBC with Differential/Platelet; Future -     Lipid panel; Future -     TSH; Future  Chronic fatigue  Encounter for Medicare annual wellness exam  Other orders -     ibuprofen (ADVIL,MOTRIN) 600 MG tablet; Take 1 tablet (600 mg total) by mouth every 8 (eight) hours as needed for headache. -     galantamine (RAZADYNE) 12 MG tablet; Take 1 tablet (12 mg total) by mouth 2 (two) times daily. -     ALPRAZolam (XANAX) 0.5 MG tablet; Take 1 tablet (0.5 mg total) by mouth at bedtime as needed for anxiety or sleep. -     polyethylene glycol powder (GLYCOLAX/MIRALAX) powder; Take 17 g by mouth 2 (  two) times daily as needed. -     Lysine 1000 MG TABS; Take 1 tablet (1,000 mg total) by mouth daily.   I have discontinued Shelley Bray's nitroGLYCERIN and Lysine. I have also changed her ALPRAZolam. Additionally, I am having her start on Lysine. Lastly, I am having her maintain her Multiple Vitamin (MULTI-VITAMIN PO), Vitamin D3, hydrochlorothiazide, ULTRAFLORA IMMUNE HEALTH, traMADol, aspirin EC, Chlorpheniramine-Acetaminophen (CORICIDIN HBP COLD/FLU PO), Homeopathic Products (ZICAM ALLERGY RELIEF NA), ibuprofen, galantamine, and polyethylene glycol powder. We will continue to administer promethazine.  Meds ordered this encounter  Medications  . ibuprofen (ADVIL,MOTRIN) 600 MG tablet    Sig: Take 1 tablet (600 mg total) by mouth every 8 (eight) hours as needed for headache.    Dispense:  60 tablet    Refill:  1  . galantamine (RAZADYNE) 12 MG tablet    Sig: Take 1 tablet (12 mg total) by mouth 2 (two) times daily.    Dispense:  180 tablet    Refill:  3  . ALPRAZolam (XANAX) 0.5 MG tablet    Sig: Take 1 tablet (0.5 mg total) by mouth at bedtime as needed for anxiety or sleep.    Dispense:   90 tablet    Refill:  1  . polyethylene glycol powder (GLYCOLAX/MIRALAX) powder    Sig: Take 17 g by mouth 2 (two) times daily as needed.    Dispense:  500 g    Refill:  3  . Lysine 1000 MG TABS    Sig: Take 1 tablet (1,000 mg total) by mouth daily.    Dispense:  90 tablet    Refill:  3     Follow-up: Return in about 6 months (around 08/30/2017) for a follow-up visit.  Sonda Primes, MD

## 2017-03-14 NOTE — Assessment & Plan Note (Signed)
Off rx 

## 2017-06-01 ENCOUNTER — Other Ambulatory Visit: Payer: Self-pay | Admitting: Internal Medicine

## 2017-09-04 ENCOUNTER — Ambulatory Visit: Payer: Medicare Other | Admitting: Internal Medicine

## 2017-09-05 ENCOUNTER — Encounter: Payer: Self-pay | Admitting: Internal Medicine

## 2017-09-05 ENCOUNTER — Ambulatory Visit (INDEPENDENT_AMBULATORY_CARE_PROVIDER_SITE_OTHER): Payer: Medicare Other | Admitting: Internal Medicine

## 2017-09-05 ENCOUNTER — Other Ambulatory Visit (INDEPENDENT_AMBULATORY_CARE_PROVIDER_SITE_OTHER): Payer: Medicare Other

## 2017-09-05 DIAGNOSIS — E785 Hyperlipidemia, unspecified: Secondary | ICD-10-CM

## 2017-09-05 DIAGNOSIS — G3184 Mild cognitive impairment, so stated: Secondary | ICD-10-CM | POA: Diagnosis not present

## 2017-09-05 DIAGNOSIS — F411 Generalized anxiety disorder: Secondary | ICD-10-CM

## 2017-09-05 DIAGNOSIS — I10 Essential (primary) hypertension: Secondary | ICD-10-CM

## 2017-09-05 DIAGNOSIS — Z0000 Encounter for general adult medical examination without abnormal findings: Secondary | ICD-10-CM | POA: Diagnosis not present

## 2017-09-05 LAB — URINALYSIS, ROUTINE W REFLEX MICROSCOPIC
Bilirubin Urine: NEGATIVE
Hgb urine dipstick: NEGATIVE
Nitrite: POSITIVE — AB
RBC / HPF: NONE SEEN (ref 0–?)
Specific Gravity, Urine: >=1.03 — AB (ref 1.000–1.030)
Total Protein, Urine: NEGATIVE
Urine Glucose: NEGATIVE
Urobilinogen, UA: 0.2 (ref 0.0–1.0)
pH: 6 (ref 5.0–8.0)

## 2017-09-05 LAB — LIPID PANEL
Cholesterol: 246 mg/dL — ABNORMAL HIGH (ref 0–200)
HDL: 62.5 mg/dL (ref >39.00)
LDL Cholesterol: 157 mg/dL — ABNORMAL HIGH (ref 0–99)
NonHDL: 183.06
Total CHOL/HDL Ratio: 4
Triglycerides: 128 mg/dL (ref 0.0–149.0)
VLDL: 25.6 mg/dL (ref 0.0–40.0)

## 2017-09-05 LAB — CBC WITH DIFFERENTIAL/PLATELET
Basophils Absolute: 0 10*3/uL (ref 0.0–0.1)
Basophils Relative: 0.5 % (ref 0.0–3.0)
Eosinophils Absolute: 0 10*3/uL (ref 0.0–0.7)
Eosinophils Relative: 0.2 % (ref 0.0–5.0)
HCT: 40.9 % (ref 36.0–46.0)
Hemoglobin: 13.4 g/dL (ref 12.0–15.0)
Lymphocytes Relative: 30.6 % (ref 12.0–46.0)
Lymphs Abs: 2.5 10*3/uL (ref 0.7–4.0)
MCHC: 32.7 g/dL (ref 30.0–36.0)
MCV: 86.9 fl (ref 78.0–100.0)
Monocytes Absolute: 0.5 10*3/uL (ref 0.1–1.0)
Monocytes Relative: 6 % (ref 3.0–12.0)
Neutro Abs: 5.1 10*3/uL (ref 1.4–7.7)
Neutrophils Relative %: 62.7 % (ref 43.0–77.0)
Platelets: 288 10*3/uL (ref 150.0–400.0)
RBC: 4.71 Mil/uL (ref 3.87–5.11)
RDW: 13.7 % (ref 11.5–15.5)
WBC: 8.1 10*3/uL (ref 4.0–10.5)

## 2017-09-05 LAB — HEPATIC FUNCTION PANEL
ALT: 14 U/L (ref 0–35)
AST: 17 U/L (ref 0–37)
Albumin: 4.4 g/dL (ref 3.5–5.2)
Alkaline Phosphatase: 110 U/L (ref 39–117)
Bilirubin, Direct: 0.1 mg/dL (ref 0.0–0.3)
Total Bilirubin: 0.7 mg/dL (ref 0.2–1.2)
Total Protein: 8 g/dL (ref 6.0–8.3)

## 2017-09-05 LAB — TSH: TSH: 1.3 u[IU]/mL (ref 0.35–4.50)

## 2017-09-05 LAB — BASIC METABOLIC PANEL
BUN: 19 mg/dL (ref 6–23)
CO2: 27 mEq/L (ref 19–32)
Calcium: 10.1 mg/dL (ref 8.4–10.5)
Chloride: 104 mEq/L (ref 96–112)
Creatinine, Ser: 0.75 mg/dL (ref 0.40–1.20)
GFR: 97.39 mL/min (ref >60.00)
Glucose, Bld: 86 mg/dL (ref 70–99)
Potassium: 3.8 mEq/L (ref 3.5–5.1)
Sodium: 141 mEq/L (ref 135–145)

## 2017-09-05 NOTE — Patient Instructions (Addendum)
[facility] well w/Jill  Low-Sodium Eating Plan Sodium, which is an element that makes up salt, helps you maintain a healthy balance of fluids in your body. Too much sodium can increase your blood pressure and cause fluid and waste to be held in your body. Your health care provider or dietitian may recommend following this plan if you have high blood pressure (hypertension), kidney disease, liver disease, or heart failure. Eating less sodium can help lower your blood pressure, reduce swelling, and protect your heart, liver, and kidneys. What are tips for following this plan? General guidelines  Most people on this plan should limit their sodium intake to 1,500-2,000 mg (milligrams) of sodium each day. Reading food labels  The Nutrition Facts label lists the amount of sodium in one serving of the food. If you eat more than one serving, you must multiply the listed amount of sodium by the number of servings.  Choose foods with less than 140 mg of sodium per serving.  Avoid foods with 300 mg of sodium or more per serving. Shopping  Look for lower-sodium products, often labeled as "low-sodium" or "no salt added."  Always check the sodium content even if foods are labeled as "unsalted" or "no salt added".  Buy fresh foods. ? Avoid canned foods and premade or frozen meals. ? Avoid canned, cured, or processed meats  Buy breads that have less than 80 mg of sodium per slice. Cooking  Eat more home-cooked food and less restaurant, buffet, and fast food.  Avoid adding salt when cooking. Use salt-free seasonings or herbs instead of table salt or sea salt. Check with your health care provider or pharmacist before using salt substitutes.  Cook with plant-based oils, such as canola, sunflower, or olive oil. Meal planning  When eating at a restaurant, ask that your food be prepared with less salt or no salt, if possible.  Avoid foods that contain MSG (monosodium glutamate). MSG is sometimes added to  [place]inese food, bouillon, and some canned foods. What foods are recommended? The items listed may not be a complete list. Talk with your dietitian about what dietary choices are best for you. Grains Low-sodium cereals, including oats, puffed wheat and rice, and shredded wheat. Low-sodium crackers. Unsalted rice. Unsalted pasta. Low-sodium bread. Whole-grain breads and whole-grain pasta. Vegetables Fresh or frozen vegetables. "No salt added" canned vegetables. "No salt added" tomato sauce and paste. Low-sodium or reduced-sodium tomato and vegetable juice. Fruits Fresh, frozen, or canned fruit. Fruit juice. Meats and other protein foods Fresh or frozen (no salt added) meat, poultry, seafood, and fish. Low-sodium canned tuna and salmon. Unsalted nuts. Dried peas, beans, and lentils without added salt. Unsalted canned beans. Eggs. Unsalted nut butters. Dairy Milk. Soy milk. Cheese that is naturally low in sodium, such as ricotta cheese, fresh mozzarella, or Swiss cheese Low-sodium or reduced-sodium cheese. Cream cheese. Yogurt. Fats and oils Unsalted butter. Unsalted margarine with no trans fat. Vegetable oils such as canola or olive oils. Seasonings and other foods Fresh and dried herbs and spices. Salt-free seasonings. Low-sodium mustard and ketchup. Sodium-free salad dressing. Sodium-free light mayonnaise. Fresh or refrigerated horseradish. Lemon juice. Vinegar. Homemade, reduced-sodium, or low-sodium soups. Unsalted popcorn and pretzels. Low-salt or salt-free chips. What foods are not recommended? The items listed may not be a complete list. Talk with your dietitian about what dietary choices are best for you. Grains Instant hot cereals. Bread stuffing, pancake, and biscuit mixes. Croutons. Seasoned rice or pasta mixes. Noodle soup cups. Boxed or frozen macaroni and  cheese. Regular salted crackers. Self-rising flour. Vegetables Sauerkraut, pickled vegetables, and relishes. Olives. [place] fries.  Onion rings. Regular canned vegetables (not low-sodium or reduced-sodium). Regular canned tomato sauce and paste (not low-sodium or reduced-sodium). Regular tomato and vegetable juice (not low-sodium or reduced-sodium). Frozen vegetables in sauces. Meats and other protein foods Meat or fish that is salted, canned, smoked, spiced, or pickled. Bacon, ham, sausage, hotdogs, corned beef, chipped beef, packaged lunch meats, salt pork, jerky, pickled herring, anchovies, regular canned tuna, sardines, salted nuts. Dairy Processed cheese and cheese spreads. Cheese curds. Blue cheese. Feta cheese. String cheese. Regular cottage cheese. Buttermilk. Canned milk. Fats and oils Salted butter. Regular margarine. Ghee. Bacon fat. Seasonings and other foods Onion salt, garlic salt, seasoned salt, table salt, and sea salt. Canned and packaged gravies. Worcestershire sauce. Tartar sauce. Barbecue sauce. Teriyaki sauce. Soy sauce, including reduced-sodium. Steak sauce. Fish sauce. Oyster sauce. Cocktail sauce. Horseradish that you find on the shelf. Regular ketchup and mustard. Meat flavorings and tenderizers. Bouillon cubes. Hot sauce and Tabasco sauce. Premade or packaged marinades. Premade or packaged taco seasonings. Relishes. Regular salad dressings. Salsa. Potato and tortilla chips. Corn chips and puffs. Salted popcorn and pretzels. Canned or dried soups. Pizza. Frozen entrees and pot pies. Summary  Eating less sodium can help lower your blood pressure, reduce swelling, and protect your heart, liver, and kidneys.  Most people on this plan should limit their sodium intake to 1,500-2,000 mg (milligrams) of sodium each day.  Canned, boxed, and frozen foods are high in sodium. Restaurant foods, fast foods, and pizza are also very high in sodium. You also get sodium by adding salt to food.  Try to cook at home, eat more fresh fruits and vegetables, and eat less fast food, canned, processed, or prepared foods. This  information is not intended to replace advice given to you by your health care provider. Make sure you discuss any questions you have with your health care provider. Document Released: 04/14/2002 Document Revised: 10/16/2016 Document Reviewed: 10/16/2016 Elsevier Interactive Patient Education  2017 [employer].

## 2017-09-05 NOTE — Assessment & Plan Note (Signed)
Worse Re-start HCTZ Reduce salt intake

## 2017-09-05 NOTE — Progress Notes (Signed)
Subjective:  Patient ID: Shelley Bray, female    DOB: 12/13/1945  Age: 71 y.o. MRN: [id number]  CC: No chief complaint on file.   HPI Shelley Bray presents for elevated BP x 3 weeks  Not taking HCTZ. Was eating chips, cheese  Outpatient Medications Prior to Visit  Medication Sig Dispense Refill  . ALPRAZolam (XANAX) 0.5 MG tablet Take 1 tablet (0.5 mg total) by mouth at bedtime as needed for anxiety or sleep. 90 tablet 1  . aspirin EC 81 MG tablet Take 81 mg by mouth 3 (three) times a week.    . Chlorpheniramine-Acetaminophen (CORICIDIN HBP COLD/FLU PO) Take 1 tablet by mouth daily as needed (cold).    . Cholecalciferol (VITAMIN D3) 2000 UNITS capsule Take 1 capsule (2,000 Units total) by mouth daily. (Patient taking differently: Take 2,000 Units by mouth 3 (three) times a week. ) 100 capsule 3  . galantamine (RAZADYNE) 12 MG tablet Take 1 tablet (12 mg total) by mouth 2 (two) times daily. 180 tablet 3  . Homeopathic Products (ZICAM ALLERGY RELIEF NA) Place 1 spray into the nose every 8 (eight) hours as needed (allergies,cough).    . hydrochlorothiazide (MICROZIDE) 12.5 MG capsule take 1 capsule by mouth once daily 90 capsule 3  . ibuprofen (ADVIL,MOTRIN) 600 MG tablet Take 1 tablet (600 mg total) by mouth every 8 (eight) hours as needed for headache. 60 tablet 1  . Lysine 1000 MG TABS Take 1 tablet (1,000 mg total) by mouth daily. 90 tablet 3  . Multiple Vitamin (MULTI-VITAMIN PO) Take 1 tablet by mouth every morning.     . polyethylene glycol powder (GLYCOLAX/MIRALAX) powder Take 17 g by mouth 2 (two) times daily as needed. 500 g 3  . Probiotic Product (ULTRAFLORA IMMUNE HEALTH) 170 MG CAPS TAKE (1) CAPSULE DAILY. 60 capsule 5  . traMADol (ULTRAM) 50 MG tablet take 1-2 tablets by mouth twice a day if needed for pain (Patient taking differently: TAKE 25-50 mg by mouth as needed for pain) 60 tablet 1   Facility-Administered Medications Prior to Visit  Medication Dose Route Frequency  Provider Last Rate Last Dose  . promethazine (PHENERGAN) injection 50 mg  50 mg Intramuscular Q6H PRN Lindsi Bayliss, Georgina Quint, MD   50 mg at 05/08/12 1106    ROS Review of Systems  Constitutional: Positive for fatigue. Negative for activity change, appetite change, chills and unexpected weight change.  HENT: Negative for congestion, mouth sores and sinus pressure.   Eyes: Negative for visual disturbance.  Respiratory: Negative for cough and chest tightness.   Gastrointestinal: Negative for abdominal pain and nausea.  Genitourinary: Negative for difficulty urinating, frequency and vaginal pain.  Musculoskeletal: Negative for back pain and gait problem.  Skin: Negative for pallor and rash.  Neurological: Negative for dizziness, tremors, weakness, numbness and headaches.  Psychiatric/Behavioral: Negative for confusion and sleep disturbance.    Objective:  BP 140/86 (BP Location: Right Arm, Patient Position: Sitting, Cuff Size: Large)   Pulse 74   Temp 98.9 F (37.2 C) (Oral)   Ht 5\' 7"  (1.702 m)   Wt 190 lb (86.2 kg)   SpO2 100%   BMI 29.76 kg/m   BP Readings from Last 3 Encounters:  09/05/17 140/86  02/28/17 (!) 158/92  09/09/16 157/90    Wt Readings from Last 3 Encounters:  09/05/17 190 lb (86.2 kg)  02/28/17 191 lb 1.9 oz (86.7 kg)  09/09/16 178 lb 3 oz (80.8 kg)    Physical Exam  Constitutional: She appears well-developed. No distress.  HENT:  Head: Normocephalic.  Right Ear: External ear normal.  Left Ear: External ear normal.  Nose: Nose normal.  Mouth/Throat: Oropharynx is clear and moist.  Eyes: Pupils are equal, round, and reactive to light. Conjunctivae are normal. Right eye exhibits no discharge. Left eye exhibits no discharge.  Neck: Normal range of motion. Neck supple. No JVD present. No tracheal deviation present. No thyromegaly present.  Cardiovascular: Normal rate, regular rhythm and normal heart sounds.   Pulmonary/Chest: No stridor. No respiratory  distress. She has no wheezes.  Abdominal: Soft. Bowel sounds are normal. She exhibits no distension and no mass. There is no tenderness. There is no rebound and no guarding.  Musculoskeletal: She exhibits no edema or tenderness.  Lymphadenopathy:    She has no cervical adenopathy.  Neurological: She displays normal reflexes. No cranial nerve deficit. She exhibits normal muscle tone. Coordination normal.  Skin: No rash noted. No erythema.  Psychiatric: She has a normal mood and affect. Her behavior is normal. Judgment and thought content normal.    Lab Results  Component Value Date   WBC 7.2 09/09/2016   HGB 11.9 (L) 09/09/2016   HCT 35.0 (L) 09/09/2016   PLT 265 09/09/2016   GLUCOSE 97 09/09/2016   CHOL 204 (H) 02/29/2016   TRIG 158.0 (H) 02/29/2016   HDL 49.00 02/29/2016   LDLDIRECT 152.9 10/14/2012   LDLCALC 124 (H) 02/29/2016   ALT 16 09/09/2016   AST 21 09/09/2016   NA 140 09/09/2016   K 3.8 09/09/2016   CL 107 09/09/2016   CREATININE 0.90 09/09/2016   BUN 16 09/09/2016   CO2 24 09/09/2016   TSH 3.79 02/29/2016   INR 0.98 09/09/2016    Ct Head Wo Contrast  Result Date: 09/09/2016 CLINICAL DATA:  Lightheadedness this afternoon, progressing this evening to palpitations, headache and intermittent upper extremity paresthesias. EXAM: CT HEAD WITHOUT CONTRAST TECHNIQUE: Contiguous axial images were obtained from the base of the skull through the vertex without intravenous contrast. COMPARISON:  02/26/2010 FINDINGS: Brain: No evidence of acute infarction, hemorrhage, hydrocephalus, extra-axial collection or mass lesion/mass effect. Gray matter and white matter are unremarkable, with normal differentiation. Brain volume is normal for age. Vascular: No hyperdense vessel or unexpected calcification. Skull: Normal. Negative for fracture or focal lesion. Sinuses/Orbits: No acute finding. Other: None. IMPRESSION: Normal brain Electronically Signed   By: Ellery Plunk M.D.   On:  09/09/2016 01:57   Mr Brain Wo Contrast  Result Date: 09/09/2016 CLINICAL DATA:  Bilateral arm weakness and numbness. Dizziness and lightheadedness. EXAM: MRI HEAD WITHOUT CONTRAST TECHNIQUE: Multiplanar, multiecho pulse sequences of the brain and surrounding structures were obtained without intravenous contrast. COMPARISON:  Head CT 09/09/2016 FINDINGS: Brain: There is no evidence of acute infarct, intracranial hemorrhage, mass, midline shift, or extra-axial fluid collection. The ventricles and sulci are normal. Small foci of T2 hyperintensity scattered throughout the cerebral white matter are nonspecific but compatible with chronic small vessel ischemic disease, mild for age. Punctate T2 hyperintense focus in the right cerebral peduncle, favored to reflect a dilated perivascular space rather than chronic infarct. Vascular: Major intracranial vascular flow voids are preserved. Skull and upper cervical spine: Unremarkable bone marrow signal. Hyperostosis frontalis. Sinuses/Orbits: Unremarkable orbits.  Clear sinuses. Other: None. IMPRESSION: 1. No acute intracranial abnormality. 2. Mild chronic small vessel ischemic disease. Electronically Signed   By: Sebastian Ache M.D.   On: 09/09/2016 11:41   Mr Cervical Spine Wo Contrast  Result Date:  09/09/2016 CLINICAL DATA:  Bilateral arm weakness and numbness. Dizziness and lightheadedness. EXAM: MRI CERVICAL SPINE WITHOUT CONTRAST TECHNIQUE: Multiplanar, multisequence MR imaging of the cervical spine was performed. No intravenous contrast was administered. COMPARISON:  None. FINDINGS: Study is mildly motion degraded. Alignment: Cervical spine straightening.  No listhesis. Vertebrae: Preserved vertebral body heights without evidence of fracture or osseous lesion. Mild degenerative endplate changes from C3-C6. Cord: Normal signal and morphology. Posterior Fossa, vertebral arteries, paraspinal tissues: Unremarkable aside from incidental bilateral perineural cysts in the  lower cervical and upper thoracic spine. Disc levels:  Mild-to-moderate disc space narrowing C3-4 to C6-7. C2-3:  Negative. C3-4: Disc bulging and uncovertebral spurring without significant stenosis. C4-5: Disc bulging and uncovertebral spurring result in mild right neural foraminal stenosis without spinal stenosis. C5-6: Disc bulging and uncovertebral spurring result in mild right greater than left neural foraminal stenosis without spinal stenosis. C6-7: Disc bulging asymmetric to the left and uncovertebral spurring result in at most mild left neural foraminal narrowing without spinal stenosis. C7-T1:  At most minimal disc bulging without stenosis. IMPRESSION: Mild multilevel disc degeneration with mild neural foraminal narrowing as above. No spinal stenosis. Electronically Signed   By: Sebastian Ache M.D.   On: 09/09/2016 11:48    Assessment & Plan:   There are no diagnoses linked to this encounter. I am having Ms. Nickson maintain her Multiple Vitamin (MULTI-VITAMIN PO), Vitamin D3, ULTRAFLORA IMMUNE HEALTH, traMADol, aspirin EC, Chlorpheniramine-Acetaminophen (CORICIDIN HBP COLD/FLU PO), Homeopathic Products (ZICAM ALLERGY RELIEF NA), ibuprofen, galantamine, ALPRAZolam, polyethylene glycol powder, Lysine, and hydrochlorothiazide. We will continue to administer promethazine.  No orders of the defined types were placed in this encounter.    Follow-up: No Follow-up on file.  Sonda Primes, MD

## 2017-09-06 MED ORDER — CEFUROXIME AXETIL 250 MG PO TABS: 250.0000 mg | ORAL_TABLET | Freq: Two times a day (BID) | ORAL | 0 refills | Status: AC

## 2017-12-14 ENCOUNTER — Ambulatory Visit: Payer: Medicare Other | Admitting: Internal Medicine

## 2018-01-16 ENCOUNTER — Other Ambulatory Visit: Payer: Self-pay | Admitting: Internal Medicine

## 2018-01-16 NOTE — Telephone Encounter (Signed)
Please advise about refill in Dr. Plotnikovs absence. 

## 2018-01-16 NOTE — Telephone Encounter (Signed)
Routing to dr jones, please advise in the absence of dr plotnikov, thanks 

## 2018-01-24 ENCOUNTER — Other Ambulatory Visit: Payer: Self-pay | Admitting: Internal Medicine

## 2018-03-05 ENCOUNTER — Encounter: Payer: Self-pay | Admitting: Internal Medicine

## 2018-03-05 ENCOUNTER — Ambulatory Visit (INDEPENDENT_AMBULATORY_CARE_PROVIDER_SITE_OTHER): Payer: Medicare Other | Admitting: Internal Medicine

## 2018-03-05 ENCOUNTER — Other Ambulatory Visit: Payer: Medicare Other

## 2018-03-05 ENCOUNTER — Other Ambulatory Visit (INDEPENDENT_AMBULATORY_CARE_PROVIDER_SITE_OTHER): Payer: Medicare Other

## 2018-03-05 DIAGNOSIS — R1032 Left lower quadrant pain: Secondary | ICD-10-CM

## 2018-03-05 DIAGNOSIS — K219 Gastro-esophageal reflux disease without esophagitis: Secondary | ICD-10-CM | POA: Diagnosis not present

## 2018-03-05 LAB — URINALYSIS, ROUTINE W REFLEX MICROSCOPIC
Bilirubin Urine: NEGATIVE
Hgb urine dipstick: NEGATIVE
Ketones, ur: NEGATIVE
Nitrite: NEGATIVE
Specific Gravity, Urine: >=1.03 — AB (ref 1.000–1.030)
Urine Glucose: NEGATIVE
Urobilinogen, UA: 0.2 (ref 0.0–1.0)
pH: 6 (ref 5.0–8.0)

## 2018-03-05 LAB — CBC WITH DIFFERENTIAL/PLATELET
Basophils Absolute: 0 10*3/uL (ref 0.0–0.1)
Basophils Relative: 0.5 % (ref 0.0–3.0)
Eosinophils Absolute: 0.1 10*3/uL (ref 0.0–0.7)
Eosinophils Relative: 1.1 % (ref 0.0–5.0)
HCT: 37.7 % (ref 36.0–46.0)
Hemoglobin: 12.5 g/dL (ref 12.0–15.0)
Lymphocytes Relative: 23.4 % (ref 12.0–46.0)
Lymphs Abs: 1.8 10*3/uL (ref 0.7–4.0)
MCHC: 33.1 g/dL (ref 30.0–36.0)
MCV: 85.9 fl (ref 78.0–100.0)
Monocytes Absolute: 0.4 10*3/uL (ref 0.1–1.0)
Monocytes Relative: 5.1 % (ref 3.0–12.0)
Neutro Abs: 5.2 10*3/uL (ref 1.4–7.7)
Neutrophils Relative %: 69.9 % (ref 43.0–77.0)
Platelets: 262 10*3/uL (ref 150.0–400.0)
RBC: 4.39 Mil/uL (ref 3.87–5.11)
RDW: 13.9 % (ref 11.5–15.5)
WBC: 7.5 10*3/uL (ref 4.0–10.5)

## 2018-03-05 LAB — BASIC METABOLIC PANEL
BUN: 15 mg/dL (ref 6–23)
CO2: 27 mEq/L (ref 19–32)
Calcium: 9.4 mg/dL (ref 8.4–10.5)
Chloride: 106 mEq/L (ref 96–112)
Creatinine, Ser: 0.75 mg/dL (ref 0.40–1.20)
GFR: 97.26 mL/min (ref >60.00)
Glucose, Bld: 86 mg/dL (ref 70–99)
Potassium: 3.7 mEq/L (ref 3.5–5.1)
Sodium: 142 mEq/L (ref 135–145)

## 2018-03-05 LAB — SEDIMENTATION RATE: Sed Rate: 58 mm/hr — ABNORMAL HIGH (ref 0–30)

## 2018-03-05 LAB — HEPATIC FUNCTION PANEL
ALT: 12 U/L (ref 0–35)
AST: 16 U/L (ref 0–37)
Albumin: 4.2 g/dL (ref 3.5–5.2)
Alkaline Phosphatase: 105 U/L (ref 39–117)
Bilirubin, Direct: 0.1 mg/dL (ref 0.0–0.3)
Total Bilirubin: 0.6 mg/dL (ref 0.2–1.2)
Total Protein: 7.2 g/dL (ref 6.0–8.3)

## 2018-03-05 MED ORDER — PANTOPRAZOLE SODIUM 40 MG PO TBEC: 40.0000 mg | DELAYED_RELEASE_TABLET | Freq: Every day | ORAL | 3 refills | Status: DC

## 2018-03-05 MED ORDER — METRONIDAZOLE 500 MG PO TABS: 500.0000 mg | ORAL_TABLET | Freq: Three times a day (TID) | ORAL | 0 refills | Status: DC

## 2018-03-05 MED ORDER — LEVOFLOXACIN 500 MG PO TABS: 500.0000 mg | ORAL_TABLET | Freq: Every day | ORAL | 0 refills | Status: AC

## 2018-03-05 NOTE — Assessment & Plan Note (Signed)
Worse Protonix

## 2018-03-05 NOTE — Assessment & Plan Note (Signed)
?  diverticulitis vs other The pt had pelvic and abd and pelvic US recently (Dr Arelia Sneddon), pt had labs. C/o blood on toilet paper x 1 mo. C/o pain in the LLQ x 1 mo (6/10 worse in am; 10/10 yesterday). C/o fatigue  x 1 mo. No CP, SOB. Fam h/o GS.Marland KitchenMarland KitchenColon 2 y ago... - Dr Loreta Ave  PO abx CT abd if not better  Tramadol prn

## 2018-03-05 NOTE — Progress Notes (Signed)
Subjective:  Patient ID: Shelley Bray, female    DOB: 04-23-1946  Age: 72 y.o. MRN: [id number]  CC: No chief complaint on file.   HPI Shelley Bray presents for worsening indigestion - heartburn. The pt had pelvic and abd and pelvic US recently (Dr Arelia Sneddon). C/o blood on toilet paper x 1 mo. C/o pain in the LLQ x 1 mo (6/10 worse in am; 10/10 yesterday). C/o fatigue  x 1 mo. No CP, SOB. Fam h/o GS.Marland KitchenMarland KitchenColon 2 y ago... - Dr Loreta Ave  Outpatient Medications Prior to Visit  Medication Sig Dispense Refill  . ALPRAZolam (XANAX) 0.5 MG tablet Take 1 tablet (0.5 mg total) by mouth at bedtime as needed for anxiety or sleep. 90 tablet 1  . Chlorpheniramine-Acetaminophen (CORICIDIN HBP COLD/FLU PO) Take 1 tablet by mouth daily as needed (cold).    . Cholecalciferol (VITAMIN D3) 2000 UNITS capsule Take 1 capsule (2,000 Units total) by mouth daily. (Patient taking differently: Take 2,000 Units by mouth 3 (three) times a week. ) 100 capsule 3  . galantamine (RAZADYNE) 12 MG tablet Take 1 tablet (12 mg total) by mouth 2 (two) times daily. 180 tablet 3  . hydrochlorothiazide (MICROZIDE) 12.5 MG capsule take 1 capsule by mouth once daily 90 capsule 3  . ibuprofen (ADVIL,MOTRIN) 600 MG tablet Take 1 tablet (600 mg total) by mouth every 8 (eight) hours as needed for headache. 60 tablet 1  . Lysine 1000 MG TABS Take 1 tablet (1,000 mg total) by mouth daily. 90 tablet 3  . Multiple Vitamin (MULTI-VITAMIN PO) Take 1 tablet by mouth every morning.     Marland Kitchen Peppermint Oil (IBGARD PO) Take by mouth.    . polyethylene glycol powder (GLYCOLAX/MIRALAX) powder Take 17 g by mouth 2 (two) times daily as needed. 500 g 3  . Probiotic Product (ULTRAFLORA IMMUNE HEALTH) 170 MG CAPS TAKE (1) CAPSULE DAILY. 60 capsule 5  . traMADol (ULTRAM) 50 MG tablet take 1-2 tablets by mouth twice a day if needed for pain 60 tablet 0  . aspirin EC 81 MG tablet Take 81 mg by mouth 3 (three) times a week.    . Homeopathic Products (ZICAM ALLERGY  RELIEF NA) Place 1 spray into the nose every 8 (eight) hours as needed (allergies,cough).     Facility-Administered Medications Prior to Visit  Medication Dose Route Frequency Provider Last Rate Last Dose  . promethazine (PHENERGAN) injection 50 mg  50 mg Intramuscular Q6H PRN Omeed Osuna, Georgina Quint, MD   50 mg at 05/08/12 1106    ROS Review of Systems  Constitutional: Negative for activity change, appetite change, chills, fatigue and unexpected weight change.  HENT: Negative for congestion, mouth sores and sinus pressure.   Eyes: Negative for visual disturbance.  Respiratory: Negative for cough and chest tightness.   Gastrointestinal: Positive for abdominal pain, blood in stool and diarrhea. Negative for nausea.  Genitourinary: Negative for difficulty urinating, frequency and vaginal pain.  Musculoskeletal: Negative for back pain and gait problem.  Skin: Negative for pallor and rash.  Neurological: Negative for dizziness, tremors, weakness, numbness and headaches.  Psychiatric/Behavioral: Negative for confusion and sleep disturbance.    Objective:  BP 130/74 (BP Location: Left Arm, Patient Position: Sitting, Cuff Size: Large)   Pulse 61   Temp 98.5 F (36.9 C) (Oral)   Ht 5\' 7"  (1.702 m)   Wt 190 lb (86.2 kg)   SpO2 98%   BMI 29.76 kg/m   BP Readings from Last 3 Encounters:  03/05/18 130/74  09/05/17 140/86  02/28/17 (!) 158/92    Wt Readings from Last 3 Encounters:  03/05/18 190 lb (86.2 kg)  09/05/17 190 lb (86.2 kg)  02/28/17 191 lb 1.9 oz (86.7 kg)    Physical Exam  Constitutional: She appears well-developed. No distress.  HENT:  Head: Normocephalic.  Right Ear: External ear normal.  Left Ear: External ear normal.  Nose: Nose normal.  Mouth/Throat: Oropharynx is clear and moist.  Eyes: Pupils are equal, round, and reactive to light. Conjunctivae are normal. Right eye exhibits no discharge. Left eye exhibits no discharge.  Neck: Normal range of motion. Neck  supple. No JVD present. No tracheal deviation present. No thyromegaly present.  Cardiovascular: Normal rate, regular rhythm and normal heart sounds.  Pulmonary/Chest: No stridor. No respiratory distress. She has no wheezes.  Abdominal: Soft. Bowel sounds are normal. She exhibits no distension and no mass. There is tenderness. There is no rebound and no guarding.  Musculoskeletal: She exhibits no edema or tenderness.  Lymphadenopathy:    She has no cervical adenopathy.  Neurological: She displays normal reflexes. No cranial nerve deficit. She exhibits normal muscle tone. Coordination normal.  Skin: No rash noted. No erythema.  Psychiatric: She has a normal mood and affect. Her behavior is normal. Judgment and thought content normal.   LLQ is sensitive  Lab Results  Component Value Date   WBC 8.1 09/05/2017   HGB 13.4 09/05/2017   HCT 40.9 09/05/2017   PLT 288.0 09/05/2017   GLUCOSE 86 09/05/2017   CHOL 246 (H) 09/05/2017   TRIG 128.0 09/05/2017   HDL 62.50 09/05/2017   LDLDIRECT 152.9 10/14/2012   LDLCALC 157 (H) 09/05/2017   ALT 14 09/05/2017   AST 17 09/05/2017   NA 141 09/05/2017   K 3.8 09/05/2017   CL 104 09/05/2017   CREATININE 0.75 09/05/2017   BUN 19 09/05/2017   CO2 27 09/05/2017   TSH 1.30 09/05/2017   INR 0.98 09/09/2016    Ct Head Wo Contrast  Result Date: 09/09/2016 CLINICAL DATA:  Lightheadedness this afternoon, progressing this evening to palpitations, headache and intermittent upper extremity paresthesias. EXAM: CT HEAD WITHOUT CONTRAST TECHNIQUE: Contiguous axial images were obtained from the base of the skull through the vertex without intravenous contrast. COMPARISON:  02/26/2010 FINDINGS: Brain: No evidence of acute infarction, hemorrhage, hydrocephalus, extra-axial collection or mass lesion/mass effect. Gray matter and white matter are unremarkable, with normal differentiation. Brain volume is normal for age. Vascular: No hyperdense vessel or unexpected  calcification. Skull: Normal. Negative for fracture or focal lesion. Sinuses/Orbits: No acute finding. Other: None. IMPRESSION: Normal brain Electronically Signed   By: Ellery Plunk M.D.   On: 09/09/2016 01:57   Mr Brain Wo Contrast  Result Date: 09/09/2016 CLINICAL DATA:  Bilateral arm weakness and numbness. Dizziness and lightheadedness. EXAM: MRI HEAD WITHOUT CONTRAST TECHNIQUE: Multiplanar, multiecho pulse sequences of the brain and surrounding structures were obtained without intravenous contrast. COMPARISON:  Head CT 09/09/2016 FINDINGS: Brain: There is no evidence of acute infarct, intracranial hemorrhage, mass, midline shift, or extra-axial fluid collection. The ventricles and sulci are normal. Small foci of T2 hyperintensity scattered throughout the cerebral white matter are nonspecific but compatible with chronic small vessel ischemic disease, mild for age. Punctate T2 hyperintense focus in the right cerebral peduncle, favored to reflect a dilated perivascular space rather than chronic infarct. Vascular: Major intracranial vascular flow voids are preserved. Skull and upper cervical spine: Unremarkable bone marrow signal. Hyperostosis frontalis. Sinuses/Orbits: Unremarkable orbits.  Clear sinuses. Other: None. IMPRESSION: 1. No acute intracranial abnormality. 2. Mild chronic small vessel ischemic disease. Electronically Signed   By: Sebastian Ache M.D.   On: 09/09/2016 11:41   Mr Cervical Spine Wo Contrast  Result Date: 09/09/2016 CLINICAL DATA:  Bilateral arm weakness and numbness. Dizziness and lightheadedness. EXAM: MRI CERVICAL SPINE WITHOUT CONTRAST TECHNIQUE: Multiplanar, multisequence MR imaging of the cervical spine was performed. No intravenous contrast was administered. COMPARISON:  None. FINDINGS: Study is mildly motion degraded. Alignment: Cervical spine straightening.  No listhesis. Vertebrae: Preserved vertebral body heights without evidence of fracture or osseous lesion. Mild  degenerative endplate changes from C3-C6. Cord: Normal signal and morphology. Posterior Fossa, vertebral arteries, paraspinal tissues: Unremarkable aside from incidental bilateral perineural cysts in the lower cervical and upper thoracic spine. Disc levels:  Mild-to-moderate disc space narrowing C3-4 to C6-7. C2-3:  Negative. C3-4: Disc bulging and uncovertebral spurring without significant stenosis. C4-5: Disc bulging and uncovertebral spurring result in mild right neural foraminal stenosis without spinal stenosis. C5-6: Disc bulging and uncovertebral spurring result in mild right greater than left neural foraminal stenosis without spinal stenosis. C6-7: Disc bulging asymmetric to the left and uncovertebral spurring result in at most mild left neural foraminal narrowing without spinal stenosis. C7-T1:  At most minimal disc bulging without stenosis. IMPRESSION: Mild multilevel disc degeneration with mild neural foraminal narrowing as above. No spinal stenosis. Electronically Signed   By: Sebastian Ache M.D.   On: 09/09/2016 11:48    Assessment & Plan:   There are no diagnoses linked to this encounter. I have discontinued Rush Farmer. Plazola's aspirin EC and Homeopathic Products (ZICAM ALLERGY RELIEF NA). I am also having her maintain her Multiple Vitamin (MULTI-VITAMIN PO), Vitamin D3, ULTRAFLORA IMMUNE HEALTH, Chlorpheniramine-Acetaminophen (CORICIDIN HBP COLD/FLU PO), ibuprofen, galantamine, ALPRAZolam, polyethylene glycol powder, Lysine, hydrochlorothiazide, traMADol, and Peppermint Oil (IBGARD PO). We will continue to administer promethazine.  No orders of the defined types were placed in this encounter.    Follow-up: No follow-ups on file.  Sonda Primes, MD

## 2018-03-05 NOTE — Patient Instructions (Signed)
?  diverticulitis vs other  CT abd if not better

## 2018-03-06 ENCOUNTER — Other Ambulatory Visit: Payer: Self-pay | Admitting: Internal Medicine

## 2018-03-06 ENCOUNTER — Encounter: Payer: Self-pay | Admitting: Internal Medicine

## 2018-03-06 MED ORDER — FLUCONAZOLE 150 MG PO TABS: 150.0000 mg | ORAL_TABLET | Freq: Once | ORAL | 1 refills | Status: AC

## 2018-03-20 ENCOUNTER — Ambulatory Visit (INDEPENDENT_AMBULATORY_CARE_PROVIDER_SITE_OTHER): Payer: Medicare Other | Admitting: Internal Medicine

## 2018-03-20 ENCOUNTER — Encounter: Payer: Self-pay | Admitting: Internal Medicine

## 2018-03-20 DIAGNOSIS — F411 Generalized anxiety disorder: Secondary | ICD-10-CM | POA: Diagnosis not present

## 2018-03-20 DIAGNOSIS — R1032 Left lower quadrant pain: Secondary | ICD-10-CM | POA: Diagnosis not present

## 2018-03-20 MED ORDER — ALPRAZOLAM 0.5 MG PO TABS: 0.5000 mg | ORAL_TABLET | Freq: Every evening | ORAL | 1 refills | Status: DC | PRN

## 2018-03-20 NOTE — Progress Notes (Signed)
Subjective:  Patient ID: Shelley Bray, female    DOB: 1946-01-08  Age: 72 y.o. MRN: [id number]  CC: No chief complaint on file.   HPI Shelley Bray presents for  indigestion f/u. The pt had pelvic and abd and pelvic US recently (Dr Arelia Sneddon). C/o blood on toilet paper x 1 mo.F/u pain in the LLQ x 1 mo - 75% better after abx. No CP, SOB. Fam h/o GS.Marland KitchenMarland KitchenColon 2 y ago... - Dr Loreta Ave  F/u anxiety   Outpatient Medications Prior to Visit  Medication Sig Dispense Refill  . ALPRAZolam (XANAX) 0.5 MG tablet Take 1 tablet (0.5 mg total) by mouth at bedtime as needed for anxiety or sleep. 90 tablet 1  . Chlorpheniramine-Acetaminophen (CORICIDIN HBP COLD/FLU PO) Take 1 tablet by mouth daily as needed (cold).    . Cholecalciferol (VITAMIN D3) 2000 UNITS capsule Take 1 capsule (2,000 Units total) by mouth daily. (Patient taking differently: Take 2,000 Units by mouth 3 (three) times a week. ) 100 capsule 3  . galantamine (RAZADYNE) 12 MG tablet TAKE 1 TABLET BY MOUTH TWICE A DAY 180 tablet 1  . hydrochlorothiazide (MICROZIDE) 12.5 MG capsule take 1 capsule by mouth once daily 90 capsule 3  . ibuprofen (ADVIL,MOTRIN) 600 MG tablet Take 1 tablet (600 mg total) by mouth every 8 (eight) hours as needed for headache. 60 tablet 1  . Lysine 1000 MG TABS Take 1 tablet (1,000 mg total) by mouth daily. 90 tablet 3  . Multiple Vitamin (MULTI-VITAMIN PO) Take 1 tablet by mouth every morning.     . pantoprazole (PROTONIX) 40 MG tablet Take 1 tablet (40 mg total) by mouth daily. 30 tablet 3  . Peppermint Oil (IBGARD PO) Take by mouth.    . polyethylene glycol powder (GLYCOLAX/MIRALAX) powder Take 17 g by mouth 2 (two) times daily as needed. 500 g 3  . Probiotic Product (ULTRAFLORA IMMUNE HEALTH) 170 MG CAPS TAKE (1) CAPSULE DAILY. 60 capsule 5  . traMADol (ULTRAM) 50 MG tablet take 1-2 tablets by mouth twice a day if needed for pain 60 tablet 0   Facility-Administered Medications Prior to Visit  Medication Dose Route  Frequency Provider Last Rate Last Dose  . promethazine (PHENERGAN) injection 50 mg  50 mg Intramuscular Q6H PRN Plotnikov, Georgina Quint, MD   50 mg at 05/08/12 1106    ROS Review of Systems  Constitutional: Negative for activity change, appetite change, chills, diaphoresis, fatigue, fever and unexpected weight change.  HENT: Negative for congestion, mouth sores and sinus pressure.   Eyes: Negative for visual disturbance.  Respiratory: Negative for cough and chest tightness.   Gastrointestinal: Positive for abdominal pain. Negative for nausea.       Mild pain at times  Genitourinary: Negative for difficulty urinating, frequency and vaginal pain.  Musculoskeletal: Negative for back pain and gait problem.  Skin: Negative for pallor and rash.  Neurological: Negative for dizziness, tremors, weakness, numbness and headaches.  Psychiatric/Behavioral: Negative for confusion and sleep disturbance. The patient is nervous/anxious.     Objective:  BP 124/68 (BP Location: Right Arm, Patient Position: Sitting, Cuff Size: Large)   Pulse 62   Temp 98.9 F (37.2 C) (Oral)   Ht  (1.702 m)   Wt 193 lb (87.5 kg)   SpO2 98%   BMI 30.23 kg/m   BP Readings from Last 3 Encounters:  03/20/18 124/68  03/05/18 130/74  09/05/17 140/86    Wt Readings from Last 3 Encounters:  03/20/18 193  lb (87.5 kg)  03/05/18 190 lb (86.2 kg)  09/05/17 190 lb (86.2 kg)    Physical Exam  Constitutional: She appears well-developed. No distress.  HENT:  Head: Normocephalic.  Right Ear: External ear normal.  Left Ear: External ear normal.  Nose: Nose normal.  Mouth/Throat: Oropharynx is clear and moist.  Eyes: Pupils are equal, round, and reactive to light. Conjunctivae are normal. Right eye exhibits no discharge. Left eye exhibits no discharge.  Neck: Normal range of motion. Neck supple. No JVD present. No tracheal deviation present. No thyromegaly present.  Cardiovascular: Normal rate, regular rhythm and  normal heart sounds.  Pulmonary/Chest: No stridor. No respiratory distress. She has no wheezes.  Abdominal: Soft. Bowel sounds are normal. She exhibits no distension and no mass. There is no tenderness. There is no rebound and no guarding.  Musculoskeletal: She exhibits no edema or tenderness.  Lymphadenopathy:    She has no cervical adenopathy.  Neurological: She displays normal reflexes. No cranial nerve deficit. She exhibits normal muscle tone. Coordination normal.  Skin: No rash noted. No erythema.  Psychiatric: She has a normal mood and affect. Her behavior is normal. Judgment and thought content normal.    abd is s/NT and no mass   Lab Results  Component Value Date   WBC 7.5 03/05/2018   HGB 12.5 03/05/2018   HCT 37.7 03/05/2018   PLT 262.0 03/05/2018   GLUCOSE 86 03/05/2018   CHOL 246 (H) 09/05/2017   TRIG 128.0 09/05/2017   HDL 62.50 09/05/2017   LDLDIRECT 152.9 10/14/2012   LDLCALC 157 (H) 09/05/2017   ALT 12 03/05/2018   AST 16 03/05/2018   NA 142 03/05/2018   K 3.7 03/05/2018   CL 106 03/05/2018   CREATININE 0.75 03/05/2018   BUN 15 03/05/2018   CO2 27 03/05/2018   TSH 1.30 09/05/2017   INR 0.98 09/09/2016    Ct Head Wo Contrast  Result Date: 09/09/2016 CLINICAL DATA:  Lightheadedness this afternoon, progressing this evening to palpitations, headache and intermittent upper extremity paresthesias. EXAM: CT HEAD WITHOUT CONTRAST TECHNIQUE: Contiguous axial images were obtained from the base of the skull through the vertex without intravenous contrast. COMPARISON:  02/26/2010 FINDINGS: Brain: No evidence of acute infarction, hemorrhage, hydrocephalus, extra-axial collection or mass lesion/mass effect. Gray matter and white matter are unremarkable, with normal differentiation. Brain volume is normal for age. Vascular: No hyperdense vessel or unexpected calcification. Skull: Normal. Negative for fracture or focal lesion. Sinuses/Orbits: No acute finding. Other: None.  IMPRESSION: Normal brain Electronically Signed   By: Ellery Plunk M.D.   On: 09/09/2016 01:57   Mr Brain Wo Contrast  Result Date: 09/09/2016 CLINICAL DATA:  Bilateral arm weakness and numbness. Dizziness and lightheadedness. EXAM: MRI HEAD WITHOUT CONTRAST TECHNIQUE: Multiplanar, multiecho pulse sequences of the brain and surrounding structures were obtained without intravenous contrast. COMPARISON:  Head CT 09/09/2016 FINDINGS: Brain: There is no evidence of acute infarct, intracranial hemorrhage, mass, midline shift, or extra-axial fluid collection. The ventricles and sulci are normal. Small foci of T2 hyperintensity scattered throughout the cerebral white matter are nonspecific but compatible with chronic small vessel ischemic disease, mild for age. Punctate T2 hyperintense focus in the right cerebral peduncle, favored to reflect a dilated perivascular space rather than chronic infarct. Vascular: Major intracranial vascular flow voids are preserved. Skull and upper cervical spine: Unremarkable bone marrow signal. Hyperostosis frontalis. Sinuses/Orbits: Unremarkable orbits.  Clear sinuses. Other: None. IMPRESSION: 1. No acute intracranial abnormality. 2. Mild chronic small vessel ischemic  disease. Electronically Signed   By: Sebastian Ache M.D.   On: 09/09/2016 11:41   Mr Cervical Spine Wo Contrast  Result Date: 09/09/2016 CLINICAL DATA:  Bilateral arm weakness and numbness. Dizziness and lightheadedness. EXAM: MRI CERVICAL SPINE WITHOUT CONTRAST TECHNIQUE: Multiplanar, multisequence MR imaging of the cervical spine was performed. No intravenous contrast was administered. COMPARISON:  None. FINDINGS: Study is mildly motion degraded. Alignment: Cervical spine straightening.  No listhesis. Vertebrae: Preserved vertebral body heights without evidence of fracture or osseous lesion. Mild degenerative endplate changes from C3-C6. Cord: Normal signal and morphology. Posterior Fossa, vertebral arteries,  paraspinal tissues: Unremarkable aside from incidental bilateral perineural cysts in the lower cervical and upper thoracic spine. Disc levels:  Mild-to-moderate disc space narrowing C3-4 to C6-7. C2-3:  Negative. C3-4: Disc bulging and uncovertebral spurring without significant stenosis. C4-5: Disc bulging and uncovertebral spurring result in mild right neural foraminal stenosis without spinal stenosis. C5-6: Disc bulging and uncovertebral spurring result in mild right greater than left neural foraminal stenosis without spinal stenosis. C6-7: Disc bulging asymmetric to the left and uncovertebral spurring result in at most mild left neural foraminal narrowing without spinal stenosis. C7-T1:  At most minimal disc bulging without stenosis. IMPRESSION: Mild multilevel disc degeneration with mild neural foraminal narrowing as above. No spinal stenosis. Electronically Signed   By: Sebastian Ache M.D.   On: 09/09/2016 11:48    Assessment & Plan:   There are no diagnoses linked to this encounter. I am having Shelley Bray maintain her Multiple Vitamin (MULTI-VITAMIN PO), Vitamin D3, ULTRAFLORA IMMUNE HEALTH, Chlorpheniramine-Acetaminophen (CORICIDIN HBP COLD/FLU PO), ibuprofen, ALPRAZolam, polyethylene glycol powder, Lysine, hydrochlorothiazide, traMADol, Peppermint Oil (IBGARD PO), pantoprazole, and galantamine. We will continue to administer promethazine.  No orders of the defined types were placed in this encounter.    Follow-up: No follow-ups on file.  Sonda Primes, MD

## 2018-03-20 NOTE — Assessment & Plan Note (Signed)
75 % better after abx Anahit does not want more tests now If not 100% better in two wks call - consider re-testing ESR, abd CT

## 2018-03-20 NOTE — Assessment & Plan Note (Signed)
Xanax prn  Potential benefits of a long term benzodiazepines  use as well as potential risks  and complications were explained to the patient and were aknowledged. 

## 2018-03-20 NOTE — Patient Instructions (Signed)
If not 100% better in two wks please call - consider re-testing ESR, may need to get abdominal CT

## 2018-04-04 ENCOUNTER — Encounter: Payer: Self-pay | Admitting: Internal Medicine

## 2018-04-30 ENCOUNTER — Ambulatory Visit (INDEPENDENT_AMBULATORY_CARE_PROVIDER_SITE_OTHER): Payer: Medicare Other | Admitting: Family

## 2018-04-30 ENCOUNTER — Other Ambulatory Visit (INDEPENDENT_AMBULATORY_CARE_PROVIDER_SITE_OTHER): Payer: Medicare Other

## 2018-04-30 ENCOUNTER — Encounter: Payer: Self-pay | Admitting: Family

## 2018-04-30 ENCOUNTER — Ambulatory Visit: Payer: Self-pay

## 2018-04-30 ENCOUNTER — Other Ambulatory Visit: Payer: Self-pay | Admitting: Family

## 2018-04-30 VITALS — BP 124/70 | HR 80 | Temp 97.7°F | Ht 67.0 in | Wt 193.1 lb

## 2018-04-30 DIAGNOSIS — R42 Dizziness and giddiness: Secondary | ICD-10-CM

## 2018-04-30 DIAGNOSIS — Z13220 Encounter for screening for lipoid disorders: Secondary | ICD-10-CM

## 2018-04-30 DIAGNOSIS — R200 Anesthesia of skin: Secondary | ICD-10-CM

## 2018-04-30 LAB — BASIC METABOLIC PANEL
BUN: 12 mg/dL (ref 6–23)
CO2: 29 mEq/L (ref 19–32)
Calcium: 9.5 mg/dL (ref 8.4–10.5)
Chloride: 104 mEq/L (ref 96–112)
Creatinine, Ser: 0.89 mg/dL (ref 0.40–1.20)
GFR: 79.79 mL/min (ref >60.00)
Glucose, Bld: 90 mg/dL (ref 70–99)
Potassium: 3.5 mEq/L (ref 3.5–5.1)
Sodium: 141 mEq/L (ref 135–145)

## 2018-04-30 LAB — LIPID PANEL
Cholesterol: 239 mg/dL — ABNORMAL HIGH (ref 0–200)
HDL: 59.6 mg/dL (ref >39.00)
LDL Cholesterol: 151 mg/dL — ABNORMAL HIGH (ref 0–99)
NonHDL: 179.09
Total CHOL/HDL Ratio: 4
Triglycerides: 139 mg/dL (ref 0.0–149.0)
VLDL: 27.8 mg/dL (ref 0.0–40.0)

## 2018-04-30 LAB — VITAMIN B12: Vitamin B-12: 1309 pg/mL — ABNORMAL HIGH (ref 211–911)

## 2018-04-30 LAB — MAGNESIUM: Magnesium: 2.3 mg/dL (ref 1.5–2.5)

## 2018-04-30 NOTE — Telephone Encounter (Signed)
(  FYI) Our 2:00 pm today~

## 2018-04-30 NOTE — Progress Notes (Signed)
Shelley Bray is a 72 y.o. female with the following history as recorded in [employer]:  Patient Active Problem List   Diagnosis Date Noted  . LLQ abdominal pain 03/05/2018  . Well adult exam 02/28/2016  . Acute upper respiratory infection 06/24/2015  . Chest pain 12/27/2012  . GERD (gastroesophageal reflux disease) 12/27/2012  . Abdominal pain, other specified site 05/08/2012  . Grief at loss of child 05/08/2012  . ADD (attention deficit disorder) 04/25/2011  . Chronic fatigue 04/25/2011  . Dyslipidemia 01/17/2011  . Anxiety state 01/17/2011  . MILD COGNITIVE IMPAIRMENT SO STATED 01/17/2011  . Essential hypertension 01/17/2011  . FIBROCYSTIC BREAST DISEASE 01/17/2011  . SHELLFISH ALLERGY 01/17/2011    Current Outpatient Medications  Medication Sig Dispense Refill  . ALPRAZolam (XANAX) 0.5 MG tablet Take 1 tablet (0.5 mg total) by mouth at bedtime as needed for anxiety or sleep. 90 tablet 1  . Chlorpheniramine-Acetaminophen (CORICIDIN HBP COLD/FLU PO) Take 1 tablet by mouth daily as needed (cold).    . Cholecalciferol (VITAMIN D3) 2000 UNITS capsule Take 1 capsule (2,000 Units total) by mouth daily. (Patient taking differently: Take 2,000 Units by mouth 3 (three) times a week. ) 100 capsule 3  . galantamine (RAZADYNE) 12 MG tablet TAKE 1 TABLET BY MOUTH TWICE A DAY 180 tablet 1  . hydrochlorothiazide (MICROZIDE) 12.5 MG capsule take 1 capsule by mouth once daily 90 capsule 3  . Lysine 1000 MG TABS Take 1 tablet (1,000 mg total) by mouth daily. 90 tablet 3  . Multiple Vitamin (MULTI-VITAMIN PO) Take 1 tablet by mouth every morning.     . pantoprazole (PROTONIX) 40 MG tablet Take 1 tablet (40 mg total) by mouth daily. 30 tablet 3  . Peppermint Oil (IBGARD PO) Take by mouth.    . polyethylene glycol powder (GLYCOLAX/MIRALAX) powder Take 17 g by mouth 2 (two) times daily as needed. 500 g 3  . Probiotic Product (ULTRAFLORA IMMUNE HEALTH) 170 MG CAPS TAKE (1) CAPSULE DAILY. 60 capsule 5  .  traMADol (ULTRAM) 50 MG tablet take 1-2 tablets by mouth twice a day if needed for pain 60 tablet 0  . ibuprofen (ADVIL,MOTRIN) 600 MG tablet Take 1 tablet (600 mg total) by mouth every 8 (eight) hours as needed for headache. (Patient not taking: Reported on 04/30/2018) 60 tablet 1   Current Facility-Administered Medications  Medication Dose Route Frequency Provider Last Rate Last Dose  . promethazine (PHENERGAN) injection 50 mg  50 mg Intramuscular Q6H PRN Plotnikov, Georgina Quint, MD   50 mg at 05/08/12 1106    Allergies: Benadryl [diphenhydramine hcl]; Crestor [rosuvastatin calcium]; and Shellfish allergy  Past Medical History:  Diagnosis Date  . ANXIETY   . GERD   . HYPERLIPIDEMIA   . HYPERTENSION   . Memory loss    after son died in  Mar 14, 2001  . SHELLFISH ALLERGY     Past Surgical History:  Procedure Laterality Date  . TUBAL LIGATION      Family History  Problem Relation Age of Onset  . Heart attack Father 48  . Breast cancer Sister   . Cervical cancer Sister   . Ovarian cancer Sister   . Heart attack Brother 67    Social History   Tobacco Use  . Smoking status: Never Smoker  . Smokeless tobacco: Never Used  Substance Use Topics  . Alcohol use: No    Alcohol/week: 0.0 oz    Subjective:  Patient presents with concerns for feeling dizzy/ "head feels swimmy"  on and off for the past few weeks; also complaining of left arm pain for the past 3-4 days; no injury to the arm; denies any chest pain or shortness of breath; notes that she has noticed when she feels these dizzy episodes she does check her blood pressure and it is elevated; she feels that her blood pressure is "fluctuating" more in the past few weeks. Denies any chest pain or shortness of breath with the episodes of dizziness; Very difficult historian- notes that HCTZ has been on her medication list for years but was not taking it regularly at all; patient is very concerned/ suspicious that her blood pressure is actually going  up with taking the medication everyday; Does also note that about the same time the dizziness she also started an [employer] weight loss supplement;  Objective:  Vitals:   04/30/18 1402  BP: 124/70  Pulse: 80  Temp: 97.7 F (36.5 C)  TempSrc: Oral  SpO2: 97%  Weight: 193 lb 1.3 oz (87.6 kg)  Height: 5\' 7"  (1.702 m)    General: Well developed, well nourished, in no acute distress  Skin : Warm and dry.  Head: Normocephalic and atraumatic  Lungs: Respirations unlabored; clear to auscultation bilaterally without wheeze, rales, rhonchi  CVS exam: normal rate and regular rhythm.  Vessels: Symmetric bilaterally; no carotid bruits noted Neurologic: Alert and oriented; speech intact; face symmetrical; moves all extremities well; CNII-XII intact without focal deficit  Assessment:  1. Dizzy   2. Numbness   3. Lipid screening     Plan:  Check EKG in office today- no acute changes/ no changes compared to EKG in 2014; suspect symptoms are due to recent starting weight loss supplement; continue HCTZ 12.5 mg daily and will check BMP, magnesium, B12 today; check lipid panel per patient request; follow-up to be determined.   No follow-ups on file.  Orders Placed This Encounter  Procedures  . Basic Metabolic Panel (BMET)    Standing Status:   Future    Standing Expiration Date:   04/30/2019  . B12    Standing Status:   Future    Standing Expiration Date:   04/30/2019  . Magnesium    Standing Status:   Future    Standing Expiration Date:   04/30/2019  . Lipid panel    Standing Status:   Future    Standing Expiration Date:   05/01/2019  . EKG 12-Lead    Requested Prescriptions    No prescriptions requested or ordered in this encounter

## 2018-04-30 NOTE — Telephone Encounter (Signed)
Patient called in with c/o "dizziness." She says "I've been dizzy off and on since May when I saw Dr. Posey ReaPlotnikov and started taking Maxide for my BP. He had prescribed it, but I didn't take it and was trying to manage my BP on my own. Yesterday, my BP was high and today it was high, about 190/89. I have a headache, which I always have, but my left arm is painful, which is new." I asked is the room spinning, how bad is the dizziness, she says "no, it's not spinning and I just feel lightheaded, head not clear and I am walking normal." According to protocol, see PCP within 24 hours, patient says "I only want to see Dr. Posey ReaPlotnikov." I called [facility], [facility] to see if the patient can be worked in, she says his schedule is booked until Thursday, but advised the patient to see Ria ClockLaura Murray, FNP today and let her know Dr. Posey ReaPlotnikov would recommend Vernona RiegerLaura. The patient was advised of the above, I advised with her BP elevation she will need to be seen in the office today or advised to go to the ED for evaluation, she agreed to the appointment today in the office. Appointment scheduled for today at 1400 with Ria ClockLaura Murray, FNP, care advice given, patient verbalized understanding.   Reason for Disposition . [1] MODERATE dizziness (e.g., interferes with normal activities) AND [2] has NOT been evaluated by physician for this  (Exception: dizziness caused by heat exposure, sudden standing, or poor fluid intake)  Answer Assessment - Initial Assessment Questions 1. DESCRIPTION: "Describe your dizziness."     Feel not clear headed, dizziness 2. LIGHTHEADED: "Do you feel lightheaded?" (e.g., somewhat faint, woozy, weak upon standing)     Lightheaded 3. VERTIGO: "Do you feel like either you or the room is spinning or tilting?" (i.e. vertigo)     No 4. SEVERITY: "How bad is it?"  "Do you feel like you are going to faint?" "Can you stand and walk?"   - MILD - walking normally   - MODERATE - interferes with normal activities (e.g.,  work, school)    - SEVERE - unable to stand, requires support to walk, feels like passing out now.      Mild 5. ONSET:  "When did the dizziness begin?"     1 month when started taking Maxide in May 6. AGGRAVATING FACTORS: "Does anything make it worse?" (e.g., standing, change in head position)     No 7. HEART RATE: "Can you tell me your heart rate?" "How many beats in 15 seconds?"  (Note: not all patients can do this)       No 8. CAUSE: "What do you think is causing the dizziness?"     I think it's my BP 9. RECURRENT SYMPTOM: "Have you had dizziness before?" If so, ask: "When was the last time?" "What happened that time?"     Not like this 10. OTHER SYMPTOMS: "Do you have any other symptoms?" (e.g., fever, chest pain, vomiting, diarrhea, bleeding)      Left arm pain 11. PREGNANCY: "Is there any chance you are pregnant?" "When was your last menstrual period?"       No  Protocols used: DIZZINESS [facility]- LIGHTHEADEDNESS-A-AH

## 2018-05-01 ENCOUNTER — Encounter: Payer: Self-pay | Admitting: Internal Medicine

## 2018-05-01 ENCOUNTER — Telehealth: Payer: Self-pay | Admitting: Internal Medicine

## 2018-05-01 NOTE — Telephone Encounter (Signed)
Pt given results per notes of Nanine MeansLaura Murray,NP on 6/26. Pt verbalized understanding.Unable to document in result note due to result note not being routed to [facility].

## 2018-07-12 ENCOUNTER — Telehealth: Payer: Self-pay | Admitting: Internal Medicine

## 2018-07-12 MED ORDER — PANTOPRAZOLE SODIUM 40 MG PO TBEC: 40.0000 mg | DELAYED_RELEASE_TABLET | Freq: Every day | ORAL | 0 refills | Status: DC

## 2018-07-12 NOTE — Telephone Encounter (Signed)
Copied from CRM [id number]. Topic: Quick Communication - Rx Refill/Question >> Jul 12, 2018  1:58 PM Alexander Bergeron B wrote: Medication: promethazine (PHENERGAN) injection 50 mg   [[id number]]  Pt called to speak w/ pcp about the medication above; pt did not disclose the reason why; contact to advise

## 2018-07-12 NOTE — Telephone Encounter (Signed)
RX sent, pt notified  

## 2018-07-14 ENCOUNTER — Other Ambulatory Visit: Payer: Self-pay | Admitting: Internal Medicine

## 2018-07-17 ENCOUNTER — Ambulatory Visit (INDEPENDENT_AMBULATORY_CARE_PROVIDER_SITE_OTHER): Payer: Medicare Other | Admitting: Internal Medicine

## 2018-07-17 ENCOUNTER — Other Ambulatory Visit (INDEPENDENT_AMBULATORY_CARE_PROVIDER_SITE_OTHER): Payer: Medicare Other

## 2018-07-17 ENCOUNTER — Encounter: Payer: Self-pay | Admitting: Internal Medicine

## 2018-07-17 VITALS — BP 120/78 | HR 77 | Temp 98.6°F | Ht 67.0 in | Wt 191.0 lb

## 2018-07-17 DIAGNOSIS — I10 Essential (primary) hypertension: Secondary | ICD-10-CM

## 2018-07-17 DIAGNOSIS — R1032 Left lower quadrant pain: Secondary | ICD-10-CM

## 2018-07-17 DIAGNOSIS — F411 Generalized anxiety disorder: Secondary | ICD-10-CM | POA: Diagnosis not present

## 2018-07-17 DIAGNOSIS — K219 Gastro-esophageal reflux disease without esophagitis: Secondary | ICD-10-CM

## 2018-07-17 DIAGNOSIS — R140 Abdominal distension (gaseous): Secondary | ICD-10-CM

## 2018-07-17 LAB — CBC WITH DIFFERENTIAL/PLATELET
Basophils Absolute: 0 10*3/uL (ref 0.0–0.1)
Basophils Relative: 0.4 % (ref 0.0–3.0)
Eosinophils Absolute: 0 10*3/uL (ref 0.0–0.7)
Eosinophils Relative: 0.6 % (ref 0.0–5.0)
HCT: 37.1 % (ref 36.0–46.0)
Hemoglobin: 12.4 g/dL (ref 12.0–15.0)
Lymphocytes Relative: 22.2 % (ref 12.0–46.0)
Lymphs Abs: 1.6 10*3/uL (ref 0.7–4.0)
MCHC: 33.3 g/dL (ref 30.0–36.0)
MCV: 84.4 fl (ref 78.0–100.0)
Monocytes Absolute: 0.3 10*3/uL (ref 0.1–1.0)
Monocytes Relative: 4.7 % (ref 3.0–12.0)
Neutro Abs: 5.1 10*3/uL (ref 1.4–7.7)
Neutrophils Relative %: 72.1 % (ref 43.0–77.0)
Platelets: 250 10*3/uL (ref 150.0–400.0)
RBC: 4.4 Mil/uL (ref 3.87–5.11)
RDW: 13.6 % (ref 11.5–15.5)
WBC: 7 10*3/uL (ref 4.0–10.5)

## 2018-07-17 LAB — URINALYSIS, ROUTINE W REFLEX MICROSCOPIC
Bilirubin Urine: NEGATIVE
Hgb urine dipstick: NEGATIVE
Ketones, ur: NEGATIVE
Nitrite: NEGATIVE
Specific Gravity, Urine: 1.01 (ref 1.000–1.030)
Urine Glucose: NEGATIVE
Urobilinogen, UA: 0.2 (ref 0.0–1.0)
pH: 8.5 — AB (ref 5.0–8.0)

## 2018-07-17 LAB — BASIC METABOLIC PANEL
BUN: 15 mg/dL (ref 6–23)
CO2: 27 mEq/L (ref 19–32)
Calcium: 9.6 mg/dL (ref 8.4–10.5)
Chloride: 104 mEq/L (ref 96–112)
Creatinine, Ser: 0.77 mg/dL (ref 0.40–1.20)
GFR: 94.25 mL/min (ref >60.00)
Glucose, Bld: 92 mg/dL (ref 70–99)
Potassium: 3.7 mEq/L (ref 3.5–5.1)
Sodium: 139 mEq/L (ref 135–145)

## 2018-07-17 LAB — HEPATIC FUNCTION PANEL
ALT: 15 U/L (ref 0–35)
AST: 20 U/L (ref 0–37)
Albumin: 4.2 g/dL (ref 3.5–5.2)
Alkaline Phosphatase: 96 U/L (ref 39–117)
Bilirubin, Direct: 0 mg/dL (ref 0.0–0.3)
Total Bilirubin: 0.5 mg/dL (ref 0.2–1.2)
Total Protein: 7.4 g/dL (ref 6.0–8.3)

## 2018-07-17 LAB — TSH: TSH: 1.58 u[IU]/mL (ref 0.35–4.50)

## 2018-07-17 LAB — H. PYLORI ANTIBODY, IGG: H Pylori IgG: NEGATIVE

## 2018-07-17 NOTE — Assessment & Plan Note (Signed)
Try Valerian root ?

## 2018-07-17 NOTE — Progress Notes (Signed)
Subjective:  Patient ID: Shelley Bray, female    DOB: 03/01/1946  Age: 72 y.o. MRN: [id number]  CC: No chief complaint on file.   HPI BRAZIL PECORELLA presents for GERD, anxiety, HCTZ f/u F/u diverticulitis - no pain. C/o bloating, burping, lower abd pain  X hrs, recurrent. It started 2 mo ago  Outpatient Medications Prior to Visit  Medication Sig Dispense Refill  . ALPRAZolam (XANAX) 0.5 MG tablet Take 1 tablet (0.5 mg total) by mouth at bedtime as needed for anxiety or sleep. 90 tablet 1  . Chlorpheniramine-Acetaminophen (CORICIDIN HBP COLD/FLU PO) Take 1 tablet by mouth daily as needed (cold).    . Cholecalciferol (VITAMIN D3) 2000 UNITS capsule Take 1 capsule (2,000 Units total) by mouth daily. (Patient taking differently: Take 2,000 Units by mouth 3 (three) times a week. ) 100 capsule 3  . galantamine (RAZADYNE) 12 MG tablet TAKE 1 TABLET BY MOUTH TWICE A DAY 180 tablet 1  . hydrochlorothiazide (MICROZIDE) 12.5 MG capsule TAKE 1 CAPSULE BY MOUTH ONCE DAILY 90 capsule 0  . ibuprofen (ADVIL,MOTRIN) 600 MG tablet Take 1 tablet (600 mg total) by mouth every 8 (eight) hours as needed for headache. 60 tablet 1  . Lysine 1000 MG TABS Take 1 tablet (1,000 mg total) by mouth daily. 90 tablet 3  . Multiple Vitamin (MULTI-VITAMIN PO) Take 1 tablet by mouth every morning.     . pantoprazole (PROTONIX) 40 MG tablet Take 1 tablet (40 mg total) by mouth daily. 90 tablet 0  . Peppermint Oil (IBGARD PO) Take by mouth.    . polyethylene glycol powder (GLYCOLAX/MIRALAX) powder Take 17 g by mouth 2 (two) times daily as needed. 500 g 3  . Probiotic Product (ULTRAFLORA IMMUNE HEALTH) 170 MG CAPS TAKE (1) CAPSULE DAILY. 60 capsule 5  . traMADol (ULTRAM) 50 MG tablet take 1-2 tablets by mouth twice a day if needed for pain 60 tablet 0   Facility-Administered Medications Prior to Visit  Medication Dose Route Frequency Provider Last Rate Last Dose  . promethazine (PHENERGAN) injection 50 mg  50 mg  Intramuscular Q6H PRN Aundra Espin, Georgina Quint, MD   50 mg at 05/08/12 1106    ROS: Review of Systems  Constitutional: Negative for activity change, appetite change, chills, fatigue and unexpected weight change.  HENT: Negative for congestion, mouth sores and sinus pressure.   Eyes: Negative for visual disturbance.  Respiratory: Negative for cough and chest tightness.   Gastrointestinal: Negative for abdominal pain, constipation, nausea and vomiting.  Genitourinary: Negative for difficulty urinating, frequency and vaginal pain.  Musculoskeletal: Negative for back pain and gait problem.  Skin: Negative for pallor and rash.  Neurological: Negative for dizziness, tremors, weakness, numbness and headaches.  Psychiatric/Behavioral: Negative for confusion and sleep disturbance.    Objective:  BP 120/78 (BP Location: Left Arm, Patient Position: Sitting, Cuff Size: Large)   Pulse 77   Temp 98.6 F (37 C) (Oral)   Ht 5\' 7"  (1.702 m)   Wt 191 lb (86.6 kg)   SpO2 97%   BMI 29.91 kg/m   BP Readings from Last 3 Encounters:  07/17/18 120/78  04/30/18 124/70  03/20/18 124/68    Wt Readings from Last 3 Encounters:  07/17/18 191 lb (86.6 kg)  04/30/18 193 lb 1.3 oz (87.6 kg)  03/20/18 193 lb (87.5 kg)    Physical Exam  Constitutional: She appears well-developed. No distress.  HENT:  Head: Normocephalic.  Right Ear: External ear normal.  Left Ear:  External ear normal.  Nose: Nose normal.  Mouth/Throat: Oropharynx is clear and moist.  Eyes: Pupils are equal, round, and reactive to light. Conjunctivae are normal. Right eye exhibits no discharge. Left eye exhibits no discharge.  Neck: Normal range of motion. Neck supple. No JVD present. No tracheal deviation present. No thyromegaly present.  Cardiovascular: Normal rate, regular rhythm and normal heart sounds.  Pulmonary/Chest: No stridor. No respiratory distress. She has no wheezes.  Abdominal: Soft. Bowel sounds are normal. She exhibits  no distension and no mass. There is no tenderness. There is no rebound and no guarding.  Musculoskeletal: She exhibits no edema or tenderness.  Lymphadenopathy:    She has no cervical adenopathy.  Neurological: She displays normal reflexes. No cranial nerve deficit. She exhibits normal muscle tone. Coordination normal.  Skin: No rash noted. No erythema.  Psychiatric: She has a normal mood and affect. Her behavior is normal. Judgment and thought content normal.  lower abd is sensitive  Lab Results  Component Value Date   WBC 7.5 03/05/2018   HGB 12.5 03/05/2018   HCT 37.7 03/05/2018   PLT 262.0 03/05/2018   GLUCOSE 90 04/30/2018   CHOL 239 (H) 04/30/2018   TRIG 139.0 04/30/2018   HDL 59.60 04/30/2018   LDLDIRECT 152.9 10/14/2012   LDLCALC 151 (H) 04/30/2018   ALT 12 03/05/2018   AST 16 03/05/2018   NA 141 04/30/2018   K 3.5 04/30/2018   CL 104 04/30/2018   CREATININE 0.89 04/30/2018   BUN 12 04/30/2018   CO2 29 04/30/2018   TSH 1.30 09/05/2017   INR 0.98 09/09/2016    Ct Head Wo Contrast  Result Date: 09/09/2016 CLINICAL DATA:  Lightheadedness this afternoon, progressing this evening to palpitations, headache and intermittent upper extremity paresthesias. EXAM: CT HEAD WITHOUT CONTRAST TECHNIQUE: Contiguous axial images were obtained from the base of the skull through the vertex without intravenous contrast. COMPARISON:  02/26/2010 FINDINGS: Brain: No evidence of acute infarction, hemorrhage, hydrocephalus, extra-axial collection or mass lesion/mass effect. Gray matter and white matter are unremarkable, with normal differentiation. Brain volume is normal for age. Vascular: No hyperdense vessel or unexpected calcification. Skull: Normal. Negative for fracture or focal lesion. Sinuses/Orbits: No acute finding. Other: None. IMPRESSION: Normal brain Electronically Signed   By: Ellery Plunk M.D.   On: 09/09/2016 01:57   Mr Brain Wo Contrast  Result Date: 09/09/2016 CLINICAL  DATA:  Bilateral arm weakness and numbness. Dizziness and lightheadedness. EXAM: MRI HEAD WITHOUT CONTRAST TECHNIQUE: Multiplanar, multiecho pulse sequences of the brain and surrounding structures were obtained without intravenous contrast. COMPARISON:  Head CT 09/09/2016 FINDINGS: Brain: There is no evidence of acute infarct, intracranial hemorrhage, mass, midline shift, or extra-axial fluid collection. The ventricles and sulci are normal. Small foci of T2 hyperintensity scattered throughout the cerebral white matter are nonspecific but compatible with chronic small vessel ischemic disease, mild for age. Punctate T2 hyperintense focus in the right cerebral peduncle, favored to reflect a dilated perivascular space rather than chronic infarct. Vascular: Major intracranial vascular flow voids are preserved. Skull and upper cervical spine: Unremarkable bone marrow signal. Hyperostosis frontalis. Sinuses/Orbits: Unremarkable orbits.  Clear sinuses. Other: None. IMPRESSION: 1. No acute intracranial abnormality. 2. Mild chronic small vessel ischemic disease. Electronically Signed   By: Sebastian Ache M.D.   On: 09/09/2016 11:41   Mr Cervical Spine Wo Contrast  Result Date: 09/09/2016 CLINICAL DATA:  Bilateral arm weakness and numbness. Dizziness and lightheadedness. EXAM: MRI CERVICAL SPINE WITHOUT CONTRAST TECHNIQUE: Multiplanar, multisequence  MR imaging of the cervical spine was performed. No intravenous contrast was administered. COMPARISON:  None. FINDINGS: Study is mildly motion degraded. Alignment: Cervical spine straightening.  No listhesis. Vertebrae: Preserved vertebral body heights without evidence of fracture or osseous lesion. Mild degenerative endplate changes from C3-C6. Cord: Normal signal and morphology. Posterior Fossa, vertebral arteries, paraspinal tissues: Unremarkable aside from incidental bilateral perineural cysts in the lower cervical and upper thoracic spine. Disc levels:  Mild-to-moderate disc  space narrowing C3-4 to C6-7. C2-3:  Negative. C3-4: Disc bulging and uncovertebral spurring without significant stenosis. C4-5: Disc bulging and uncovertebral spurring result in mild right neural foraminal stenosis without spinal stenosis. C5-6: Disc bulging and uncovertebral spurring result in mild right greater than left neural foraminal stenosis without spinal stenosis. C6-7: Disc bulging asymmetric to the left and uncovertebral spurring result in at most mild left neural foraminal narrowing without spinal stenosis. C7-T1:  At most minimal disc bulging without stenosis. IMPRESSION: Mild multilevel disc degeneration with mild neural foraminal narrowing as above. No spinal stenosis. Electronically Signed   By: Sebastian Ache M.D.   On: 09/09/2016 11:48    Assessment & Plan:   There are no diagnoses linked to this encounter.   No orders of the defined types were placed in this encounter.    Follow-up: No follow-ups on file.  Sonda Primes, MD

## 2018-07-17 NOTE — Assessment & Plan Note (Signed)
HCTZ 

## 2018-07-17 NOTE — Assessment & Plan Note (Signed)
Better on Protonix

## 2018-07-17 NOTE — Patient Instructions (Signed)
  Gluten free trial for 4-6 weeks. OK to use gluten-free bread and gluten-free pasta.    Gluten-Free Diet for Celiac Disease, Adult The gluten-free diet includes all foods that do not contain gluten. Gluten is a protein that is found in wheat, rye, barley, and some other grains. Following the gluten-free diet is the only treatment for people with celiac disease. It helps to prevent damage to the intestines and improves or eliminates the symptoms of celiac disease. Following the gluten-free diet requires some planning. It can be challenging at first, but it gets easier with time and practice. There are more gluten-free options available today than ever before. If you need help finding gluten-free foods or if you have questions, talk with your diet and nutrition specialist (registered dietitian) or your health care provider. What do I need to know about a gluten-free diet?  All fruits, vegetables, and meats are safe to eat and do not contain gluten.  When grocery shopping, start by shopping in the produce, meat, and dairy sections. These sections are more likely to contain gluten-free foods. Then move to the aisles that contain packaged foods if you need to.  Read all food labels. Gluten is often added to foods. Always check the ingredient list and look for warnings, such as "may contain gluten."  Talk with your dietitian or health care provider before taking a gluten-free multivitamin or mineral supplement.  Be aware of gluten-free foods having contact with foods that contain gluten (cross-contamination). This can happen at home and with any processed foods. ? Talk with your health care provider or dietitian about how to reduce the risk of cross-contamination in your home. ? If you have questions about how a food is processed, ask the manufacturer. What key words help to identify gluten? Foods that list any of these key words on the label usually contain gluten:  Wheat, flour, enriched  flour, bromated flour, white flour, durum flour, graham flour, phosphated flour, self-rising flour, semolina, farina, barley (malt), rye, and oats.  Starch, dextrin, modified food starch, or cereal.  Thickening, fillers, or emulsifiers.  Malt flavoring, malt extract, or malt syrup.  Hydrolyzed vegetable protein.  In the [place] packaged foods that are gluten-free are required to be labeled "GF." These foods should be easy to identify and are safe to eat. In the [place] food companies are also required to list common food allergens, including wheat, on their labels. Recommended foods Grains  Amaranth, bean flours, 100% buckwheat flour, corn, millet, nut flours or nut meals, GF oats, quinoa, rice, sorghum, teff, rice wafers, pure cornmeal tortillas, popcorn, and hot cereals made from cornmeal. Hominy, rice, wild rice. Some Asian rice noodles or bean noodles. Arrowroot starch, corn bran, corn flour, corn germ, cornmeal, corn starch, potato flour, potato starch flour, and rice bran. Plain, [place], and sweet rice flours. Rice polish, soy flour, and tapioca starch. Vegetables  All plain fresh, frozen, and canned vegetables. Fruits  All plain fresh, frozen, canned, and dried fruits, and 100% fruit juices. Meats and other protein foods  All fresh beef, pork, poultry, fish, seafood, and eggs. Fish canned in water, oil, brine, or vegetable broth. Plain nuts and seeds, peanut butter. Some lunch meat and some frankfurters. Dried beans, dried peas, and lentils. Dairy  Fresh plain, dry, evaporated, or condensed milk. Cream, butter, sour cream, whipping cream, and most yogurts. Unprocessed cheese, most processed cheeses, some cottage cheese, some cream cheeses. Beverages  Coffee, tea, most herbal teas. Carbonated beverages and some root beers.   Wine, sake, and distilled spirits, such as gin, vodka, and whiskey. Most hard ciders. Fats and oils  Butter, margarine, vegetable oil, hydrogenated butter, olive  oil, shortening, lard, cream, and some mayonnaise. Some commercial salad dressings. Olives. Sweets and desserts  Sugar, honey, some syrups, molasses, jelly, and jam. Plain hard candy, marshmallows, and gumdrops. Pure cocoa powder. Plain chocolate. Custard and some pudding mixes. Gelatin desserts, sorbets, frozen ice pops, and sherbet. Cake, cookies, and other desserts prepared with allowed flours. Some commercial ice creams. Cornstarch, tapioca, and rice puddings. Seasoning and other foods  Some canned or frozen soups. Monosodium glutamate (MSG). Cider, rice, and wine vinegar. Baking soda and baking powder. Cream of tartar. Baking and nutritional yeast. Certain soy sauces made without wheat (ask your dietitian about specific brands that are allowed). Nuts, coconut, and chocolate. Salt, pepper, herbs, spices, flavoring extracts, imitation or artificial flavorings, natural flavorings, and food colorings. Some medicines and supplements. Some lip glosses and other cosmetics. Rice syrups. The items listed may not be a complete list. Talk with your dietitian about what dietary choices are best for you. Foods to avoid Grains  Barley, bran, bulgur, couscous, cracked wheat, Floyd, farro, graham, malt, matzo, semolina, wheat germ, and all wheat and rye cereals including spelt and kamut. Cereals containing malt as a flavoring, such as rice cereal. Noodles, spaghetti, macaroni, most packaged rice mixes, and all mixes containing wheat, rye, barley, or triticale. Vegetables  Most creamed vegetables and most vegetables canned in sauces. Some commercially prepared vegetables and salads. Fruits  Thickened or prepared fruits and some pie fillings. Some fruit snacks and fruit roll-ups. Meats and other protein foods  Any meat or meat alternative containing wheat, rye, barley, or gluten stabilizers. These are often marinated or packaged meats and lunch meats. Bread-containing products, such as Swiss steak,  croquettes, meatballs, and meatloaf. Most tuna canned in vegetable broth and turkey with hydrolyzed vegetable protein (HVP) injected as part of the basting. Seitan. Imitation fish. Eggs in sauces made from ingredients to avoid. Dairy  Commercial chocolate milk drinks and malted milk. Some non-dairy creamers. Any cheese product containing ingredients to avoid. Beverages  Certain cereal beverages. Beer, ale, malted milk, and some root beers. Some hard ciders. Some instant flavored coffees. Some herbal teas made with barley or with barley malt added. Fats and oils  Some commercial salad dressings. Sour cream containing modified food starch. Sweets and desserts  Some toffees. Chocolate-coated nuts (may be rolled in wheat flour) and some commercial candies and candy bars. Most cakes, cookies, donuts, pastries, and other baked goods. Some commercial ice cream. Ice cream cones. Commercially prepared mixes for cakes, cookies, and other desserts. Bread pudding and other puddings thickened with flour. Products containing [place] rice syrup made with barley malt enzyme. Desserts and sweets made with malt flavoring. Seasoning and other foods  Some curry powders, some dry seasoning mixes, some gravy extracts, some meat sauces, some ketchups, some prepared mustards, and horseradish. Certain soy sauces. Malt vinegar. Bouillon and bouillon cubes that contain HVP. Some chip dips, and some chewing gum. Yeast extract. Brewer's yeast. Caramel color. Some medicines and supplements. Some lip glosses and other cosmetics. The items listed may not be a complete list. Talk with your dietitian about what dietary choices are best for you. Summary  Gluten is a protein that is found in wheat, rye, barley, and some other grains. The gluten-free diet includes all foods that do not contain gluten.  If you need help finding gluten-free foods or if   you have questions, talk with your diet and nutrition specialist (registered  dietitian) or your health care provider.  Read all food labels. Gluten is often added to foods. Always check the ingredient list and look for warnings, such as "may contain gluten." This information is not intended to replace advice given to you by your health care provider. Make sure you discuss any questions you have with your health care provider. Document Released: 10/23/2005 Document Revised: 08/07/2016 Document Reviewed: 08/07/2016 Elsevier Interactive Patient Education  2018 [employer]   

## 2018-07-17 NOTE — Assessment & Plan Note (Signed)
Lower abd pain  X 2 mo w/indigestion Abd CT Labs  Gluten free trial for 4-6 weeks. OK to use gluten-free bread and gluten-free pasta.

## 2018-07-31 ENCOUNTER — Ambulatory Visit (INDEPENDENT_AMBULATORY_CARE_PROVIDER_SITE_OTHER)
Admission: RE | Admit: 2018-07-31 | Discharge: 2018-07-31 | Disposition: A | Discharge status: Home / Self Care | Payer: Medicare Other | Source: Ambulatory Visit | Attending: Internal Medicine | Admitting: Internal Medicine

## 2018-07-31 DIAGNOSIS — R1032 Left lower quadrant pain: Secondary | ICD-10-CM

## 2018-07-31 DIAGNOSIS — R140 Abdominal distension (gaseous): Secondary | ICD-10-CM | POA: Diagnosis not present

## 2018-07-31 MED ORDER — IOPAMIDOL (ISOVUE-300) INJECTION 61%
100.0000 mL | Freq: Once | INTRAVENOUS | Status: AC | PRN
  Administered 2018-07-31: 100 mL via INTRAVENOUS

## 2018-08-01 ENCOUNTER — Other Ambulatory Visit: Payer: Self-pay

## 2018-08-01 MED ORDER — GALANTAMINE HYDROBROMIDE 12 MG PO TABS: 12.0000 mg | ORAL_TABLET | Freq: Two times a day (BID) | ORAL | 1 refills | Status: DC

## 2018-10-10 ENCOUNTER — Other Ambulatory Visit: Payer: Self-pay

## 2018-10-10 MED ORDER — PANTOPRAZOLE SODIUM 40 MG PO TBEC: 40.0000 mg | DELAYED_RELEASE_TABLET | Freq: Every day | ORAL | 1 refills | Status: DC

## 2018-10-15 ENCOUNTER — Ambulatory Visit: Payer: Medicare Other | Admitting: Internal Medicine

## 2018-11-09 ENCOUNTER — Other Ambulatory Visit: Payer: Self-pay | Admitting: Internal Medicine

## 2018-12-03 ENCOUNTER — Encounter: Payer: Self-pay | Admitting: Internal Medicine

## 2018-12-03 ENCOUNTER — Ambulatory Visit (INDEPENDENT_AMBULATORY_CARE_PROVIDER_SITE_OTHER): Payer: Medicare Other | Admitting: Internal Medicine

## 2018-12-03 VITALS — BP 124/82 | HR 65 | Temp 98.0°F | Ht 67.0 in | Wt 184.0 lb

## 2018-12-03 DIAGNOSIS — I10 Essential (primary) hypertension: Secondary | ICD-10-CM

## 2018-12-03 DIAGNOSIS — E785 Hyperlipidemia, unspecified: Secondary | ICD-10-CM | POA: Diagnosis not present

## 2018-12-03 DIAGNOSIS — K219 Gastro-esophageal reflux disease without esophagitis: Secondary | ICD-10-CM

## 2018-12-03 DIAGNOSIS — F411 Generalized anxiety disorder: Secondary | ICD-10-CM

## 2018-12-03 MED ORDER — GALANTAMINE HYDROBROMIDE 12 MG PO TABS: 12.0000 mg | ORAL_TABLET | Freq: Two times a day (BID) | ORAL | 3 refills | Status: DC

## 2018-12-03 MED ORDER — PANTOPRAZOLE SODIUM 40 MG PO TBEC: 40.0000 mg | DELAYED_RELEASE_TABLET | Freq: Every day | ORAL | 3 refills | Status: DC

## 2018-12-03 MED ORDER — TRAMADOL HCL 50 MG PO TABS: ORAL_TABLET | ORAL | 3 refills | Status: DC

## 2018-12-03 MED ORDER — HYDROCHLOROTHIAZIDE 12.5 MG PO CAPS: 12.5000 mg | ORAL_CAPSULE | Freq: Every day | ORAL | 3 refills | Status: DC

## 2018-12-03 MED ORDER — ALPRAZOLAM 0.5 MG PO TABS: 0.5000 mg | ORAL_TABLET | Freq: Every evening | ORAL | 1 refills | Status: DC | PRN

## 2018-12-03 NOTE — Assessment & Plan Note (Signed)
Statin intolerant  Cardiac CT scan for calcium scoring offered

## 2018-12-03 NOTE — Assessment & Plan Note (Signed)
Weighted blanket 

## 2018-12-03 NOTE — Assessment & Plan Note (Signed)
BP Readings from Last 3 Encounters:  12/03/18 124/82  07/17/18 120/78  04/30/18 124/70

## 2018-12-03 NOTE — Assessment & Plan Note (Signed)
Resolved on Protonix, gluten free diet 

## 2018-12-03 NOTE — Progress Notes (Signed)
Subjective:  Patient ID: Shelley Bray, female    DOB: 09/12/1946  Age: 73 y.o. MRN: [id number]  CC: No chief complaint on file.   HPI Shelley Bray presents for LUQ abd pain - much better on gluten free diet and Protonix C/o FMS sx's C/o fatigue  Outpatient Medications Prior to Visit  Medication Sig Dispense Refill  . ALPRAZolam (XANAX) 0.5 MG tablet Take 1 tablet (0.5 mg total) by mouth at bedtime as needed for anxiety or sleep. 90 tablet 1  . Chlorpheniramine-Acetaminophen (CORICIDIN HBP COLD/FLU PO) Take 1 tablet by mouth daily as needed (cold).    . Cholecalciferol (VITAMIN D3) 2000 UNITS capsule Take 1 capsule (2,000 Units total) by mouth daily. (Patient taking differently: Take 2,000 Units by mouth 3 (three) times a week. ) 100 capsule 3  . galantamine (RAZADYNE) 12 MG tablet Take 1 tablet (12 mg total) by mouth 2 (two) times daily. 180 tablet 1  . hydrochlorothiazide (MICROZIDE) 12.5 MG capsule Take 1 capsule (12.5 mg total) by mouth daily. -- Office visit needed for further refills 90 capsule 0  . ibuprofen (ADVIL,MOTRIN) 600 MG tablet Take 1 tablet (600 mg total) by mouth every 8 (eight) hours as needed for headache. 60 tablet 1  . Lysine 1000 MG TABS Take 1 tablet (1,000 mg total) by mouth daily. 90 tablet 3  . Multiple Vitamin (MULTI-VITAMIN PO) Take 1 tablet by mouth every morning.     . pantoprazole (PROTONIX) 40 MG tablet Take 1 tablet (40 mg total) by mouth daily. 90 tablet 1  . Peppermint Oil (IBGARD PO) Take by mouth.    . polyethylene glycol powder (GLYCOLAX/MIRALAX) powder Take 17 g by mouth 2 (two) times daily as needed. 500 g 3  . Probiotic Product (ULTRAFLORA IMMUNE HEALTH) 170 MG CAPS TAKE (1) CAPSULE DAILY. 60 capsule 5  . traMADol (ULTRAM) 50 MG tablet take 1-2 tablets by mouth twice a day if needed for pain 60 tablet 0   Facility-Administered Medications Prior to Visit  Medication Dose Route Frequency Provider Last Rate Last Dose  . promethazine (PHENERGAN)  injection 50 mg  50 mg Intramuscular Q6H PRN , Georgina Quint, MD   50 mg at 05/08/12 1106    ROS: Review of Systems  Constitutional: Positive for fatigue. Negative for activity change, appetite change, chills and unexpected weight change.  HENT: Negative for congestion, mouth sores and sinus pressure.   Eyes: Negative for visual disturbance.  Respiratory: Negative for cough and chest tightness.   Gastrointestinal: Negative for abdominal pain and nausea.  Genitourinary: Negative for difficulty urinating, frequency and vaginal pain.  Musculoskeletal: Positive for arthralgias. Negative for back pain and gait problem.  Skin: Negative for pallor and rash.  Neurological: Negative for dizziness, tremors, weakness, numbness and headaches.  Psychiatric/Behavioral: Positive for sleep disturbance. Negative for confusion and suicidal ideas.    Objective:  BP 124/82 (BP Location: Left Arm, Patient Position: Sitting, Cuff Size: Normal)   Pulse 65   Temp 98 F (36.7 C) (Oral)   Ht 5\' 7"  (1.702 m)   Wt 184 lb (83.5 kg)   SpO2 97%   BMI 28.82 kg/m   BP Readings from Last 3 Encounters:  12/03/18 124/82  07/17/18 120/78  04/30/18 124/70    Wt Readings from Last 3 Encounters:  12/03/18 184 lb (83.5 kg)  07/17/18 191 lb (86.6 kg)  04/30/18 193 lb 1.3 oz (87.6 kg)    Physical Exam Constitutional:      General: She  is not in acute distress.    Appearance: She is well-developed.  HENT:     Head: Normocephalic.     Right Ear: External ear normal.     Left Ear: External ear normal.     Nose: Nose normal.  Eyes:     General:        Right eye: No discharge.        Left eye: No discharge.     Conjunctiva/sclera: Conjunctivae normal.     Pupils: Pupils are equal, round, and reactive to light.  Neck:     Musculoskeletal: Normal range of motion and neck supple.     Thyroid: No thyromegaly.     Vascular: No JVD.     Trachea: No tracheal deviation.  Cardiovascular:     Rate and  Rhythm: Normal rate and regular rhythm.     Heart sounds: Normal heart sounds.  Pulmonary:     Effort: No respiratory distress.     Breath sounds: No stridor. No wheezing.  Abdominal:     General: Bowel sounds are normal. There is no distension.     Palpations: Abdomen is soft. There is no mass.     Tenderness: There is no abdominal tenderness. There is no guarding or rebound.  Musculoskeletal:        General: No tenderness.  Lymphadenopathy:     Cervical: No cervical adenopathy.  Skin:    Findings: No erythema or rash.  Neurological:     Cranial Nerves: No cranial nerve deficit.     Motor: No abnormal muscle tone.     Coordination: Coordination normal.     Deep Tendon Reflexes: Reflexes normal.  Psychiatric:        Behavior: Behavior normal.        Thought Content: Thought content normal.        Judgment: Judgment normal.     Lab Results  Component Value Date   WBC 7.0 07/17/2018   HGB 12.4 07/17/2018   HCT 37.1 07/17/2018   PLT 250.0 07/17/2018   GLUCOSE 92 07/17/2018   CHOL 239 (H) 04/30/2018   TRIG 139.0 04/30/2018   HDL 59.60 04/30/2018   LDLDIRECT 152.9 10/14/2012   LDLCALC 151 (H) 04/30/2018   ALT 15 07/17/2018   AST 20 07/17/2018   NA 139 07/17/2018   K 3.7 07/17/2018   CL 104 07/17/2018   CREATININE 0.77 07/17/2018   BUN 15 07/17/2018   CO2 27 07/17/2018   TSH 1.58 07/17/2018   INR 0.98 09/09/2016    Ct Abdomen Pelvis W Contrast  Result Date: 07/31/2018 CLINICAL DATA:  Lower abdominal pain, distention and nausea for the past 2 months. History of diverticulitis. EXAM: CT ABDOMEN AND PELVIS WITH CONTRAST TECHNIQUE: Multidetector CT imaging of the abdomen and pelvis was performed using the standard protocol following bolus administration of intravenous contrast. CONTRAST:  100mL ISOVUE-300 IOPAMIDOL (ISOVUE-300) INJECTION 61% COMPARISON:  07/06/2007. Abdomen ultrasound dated 03/17/2010. FINDINGS: Lower chest: Minimal bibasilar atelectasis. Normal sized  heart. Hepatobiliary: No significant change in multiple small liver cysts. Normal appearing gallbladder. Pancreas: Unremarkable. No pancreatic ductal dilatation or surrounding inflammatory changes. Spleen: Normal in size without focal abnormality. Adrenals/Urinary Tract: Again demonstrated is a pelvic left kidney. Normal appearing adrenal glands, right kidney and right ureter. The left ureter is shortened due to the pelvic location of the left kidney. Stomach/Bowel: Stomach is within normal limits. Appendix appears normal. No evidence of bowel wall thickening, distention, or inflammatory changes. Multiple colonic diverticula. Vascular/Lymphatic: No significant  vascular findings are present. No enlarged abdominal or pelvic lymph nodes. Reproductive: Uterus and bilateral adnexa are unremarkable. Bilateral tubal ligation rings. Other: Tiny umbilical hernia containing fat. Musculoskeletal: Lumbar and lower thoracic spine degenerative changes. IMPRESSION: 1. No acute abnormality. 2. Stable multiple small liver cysts. 3. Stable left pelvic kidney. 4. Colonic diverticulosis. Electronically Signed   By: Beckie SaltsSteven  Reid M.D.   On: 07/31/2018 16:36    Assessment & Plan:   There are no diagnoses linked to this encounter.   No orders of the defined types were placed in this encounter.    Follow-up: No follow-ups on file.  Sonda PrimesAlex , MD

## 2018-12-03 NOTE — Patient Instructions (Addendum)
weighted blanket  Cardiac CT calcium scoring test $150   Computed tomography, more commonly known as a CT or CAT scan, is a diagnostic medical imaging test. Like traditional x-rays, it produces multiple images or pictures of the inside of the body. The cross-sectional images generated during a CT scan can be reformatted in multiple planes. They can even generate three-dimensional images. These images can be viewed on a computer monitor, printed on film or by a 3D printer, or transferred to a CD or DVD. CT images of internal organs, bones, soft tissue and blood vessels provide greater detail than traditional x-rays, particularly of soft tissues and blood vessels. A cardiac CT scan for coronary calcium is a non-invasive way of obtaining information about the presence, location and extent of calcified plaque in the coronary arteries-the vessels that supply oxygen-containing blood to the heart muscle. Calcified plaque results when there is a build-up of fat and other substances under the inner layer of the artery. This material can calcify which signals the presence of atherosclerosis, a disease of the vessel wall, also called coronary artery disease (CAD). People with this disease have an increased risk for heart attacks. In addition, over time, progression of plaque build up (CAD) can narrow the arteries or even close off blood flow to the heart. The result may be chest pain, sometimes called "angina," or a heart attack. Because calcium is a marker of CAD, the amount of calcium detected on a cardiac CT scan is a helpful prognostic tool. The findings on cardiac CT are expressed as a calcium score. Another name for this test is coronary artery calcium scoring.  What are some common uses of the procedure? The goal of cardiac CT scan for calcium scoring is to determine if CAD is present and to what extent, even if there are no symptoms. It is a screening study that may be recommended by a physician for  patients with risk factors for CAD but no clinical symptoms. The major risk factors for CAD are: . high blood cholesterol levels  . family history of heart attacks  . diabetes  . high blood pressure  . cigarette smoking  . overweight or obese  . physical inactivity   A negative cardiac CT scan for calcium scoring shows no calcification within the coronary arteries. This suggests that CAD is absent or so minimal it cannot be seen by this technique. The chance of having a heart attack over the next two to five years is very low under these circumstances. A positive test means that CAD is present, regardless of whether or not the patient is experiencing any symptoms. The amount of calcification-expressed as the calcium score-may help to predict the likelihood of a myocardial infarction (heart attack) in the coming years and helps your medical doctor or cardiologist decide whether the patient may need to take preventive medicine or undertake other measures such as diet and exercise to lower the risk for heart attack. The extent of CAD is graded according to your calcium score:  Calcium Score  Presence of CAD  0 No evidence of CAD   1-10 Minimal evidence of CAD  11-100 Mild evidence of CAD  101-400 Moderate evidence of CAD  Over 400 Extensive evidence of CAD   If you have medicare related insurance (such as traditional Medicare, [employer] Medicare, [employer] Medicare, or similar), Please make an appointment at the scheduling desk with Shelley Bray, the [employer], for your Wellness visit in this office, which is a  benefit with your insurance.

## 2018-12-03 NOTE — Assessment & Plan Note (Signed)
Resolved on Protonix, gluten free diet

## 2018-12-26 ENCOUNTER — Ambulatory Visit (INDEPENDENT_AMBULATORY_CARE_PROVIDER_SITE_OTHER)
Admission: RE | Admit: 2018-12-26 | Discharge: 2018-12-26 | Disposition: A | Discharge status: Home / Self Care | Payer: Self-pay | Source: Ambulatory Visit | Attending: Internal Medicine | Admitting: Internal Medicine

## 2018-12-26 DIAGNOSIS — E785 Hyperlipidemia, unspecified: Secondary | ICD-10-CM

## 2019-02-09 ENCOUNTER — Other Ambulatory Visit: Payer: Self-pay | Admitting: Internal Medicine

## 2019-04-07 ENCOUNTER — Ambulatory Visit (INDEPENDENT_AMBULATORY_CARE_PROVIDER_SITE_OTHER): Payer: Medicare Other | Admitting: Internal Medicine

## 2019-04-07 ENCOUNTER — Encounter: Payer: Self-pay | Admitting: Internal Medicine

## 2019-04-07 DIAGNOSIS — F411 Generalized anxiety disorder: Secondary | ICD-10-CM | POA: Diagnosis not present

## 2019-04-07 DIAGNOSIS — K219 Gastro-esophageal reflux disease without esophagitis: Secondary | ICD-10-CM | POA: Diagnosis not present

## 2019-04-07 DIAGNOSIS — I10 Essential (primary) hypertension: Secondary | ICD-10-CM

## 2019-04-07 DIAGNOSIS — R1084 Generalized abdominal pain: Secondary | ICD-10-CM

## 2019-04-07 MED ORDER — TRAMADOL HCL 50 MG PO TABS: ORAL_TABLET | ORAL | 3 refills | Status: DC

## 2019-04-07 NOTE — Assessment & Plan Note (Signed)
Unclear etiology.  Chronic recurrent pain.  Better on gluten-free diet.  Tramadol as needed

## 2019-04-07 NOTE — Progress Notes (Signed)
Virtual Visit via Video Note  I connected with Shelley Bray on 04/07/19 at  9:10 AM EDT by a video enabled telemedicine application and verified that I am speaking with the correct person using two identifiers.  We are having connection difficulties.   I discussed the limitations of evaluation and management by telemedicine and the availability of in person appointments. The patient expressed understanding and agreed to proceed.  History of Present Illness: We need to follow-up on anxiety-worse, insomnia worse due to moderate stress.  Follow-up on arthritis.  Shelley Bray has to take more alprazolam than usual to help Shelley Bray sleep.  There has been no runny nose, cough, chest pain, shortness of breath, abdominal pain, diarrhea, constipation,  skin rashes.   Observations/Objective: The patient sounds to be in no acute distress.  Shelley Bray lost weight on diet Shelley Bray weight is 183.  Shelley Bray blood pressure is 123/81  Assessment and Plan:  See my Assessment and Plan. Follow Up Instructions:    I discussed the assessment and treatment plan with the patient. The patient was provided an opportunity to ask questions and all were answered. The patient agreed with the plan and demonstrated an understanding of the instructions.   The patient was advised to call back or seek an in-person evaluation if the symptoms worsen or if the condition fails to improve as anticipated.  I provided face-to-face time during this encounter. We were at different locations.   Sonda Primes, MD

## 2019-04-07 NOTE — Assessment & Plan Note (Signed)
Continue with HCTZ 

## 2019-04-07 NOTE — Assessment & Plan Note (Signed)
Pantoprazole Gluten-free diet

## 2019-04-07 NOTE — Assessment & Plan Note (Signed)
Worse due to weather and stress.  Xanax as needed

## 2019-06-09 ENCOUNTER — Other Ambulatory Visit: Payer: Self-pay

## 2019-08-12 ENCOUNTER — Other Ambulatory Visit: Payer: Self-pay | Admitting: Internal Medicine

## 2019-08-13 ENCOUNTER — Other Ambulatory Visit: Payer: Self-pay | Admitting: Internal Medicine

## 2019-09-10 ENCOUNTER — Other Ambulatory Visit: Payer: Self-pay | Admitting: Internal Medicine

## 2019-09-10 DIAGNOSIS — Z1231 Encounter for screening mammogram for malignant neoplasm of breast: Secondary | ICD-10-CM

## 2019-11-03 ENCOUNTER — Ambulatory Visit: Payer: Medicare Other

## 2019-11-19 ENCOUNTER — Ambulatory Visit: Payer: Self-pay

## 2019-11-19 ENCOUNTER — Ambulatory Visit: Payer: Medicare Other | Admitting: Internal Medicine

## 2019-11-19 NOTE — Telephone Encounter (Signed)
Pt. And daughter report pt. Started having chest pain Monday after something she ate that night. Hurts in the middle of chest ,"up high." "I think it is indigestion." Pain does not radiate. "Sometimes I feel woozy, no shortness of breath or sweating." Pain now is 8/10. Constant, worse with movement. Recommended ED.Refuses. "She just needs to come in for an EKG." Spoke with Laurelyn Sickle in the practice and will send triage note for review.   Answer Assessment - Initial Assessment Questions 1. LOCATION: "Where does it hurt?"       Hurts in the middle and below my neck 2. RADIATION: "Does the pain go anywhere else?" (e.g., into neck, jaw, arms, back)     No 3. ONSET: "When did the chest pain begin?" (Minutes, hours or days)      Monday night 4. PATTERN "Does the pain come and go, or has it been constant since it started?"  "Does it get worse with exertion?"      Hurts with movement 5. DURATION: "How long does it last" (e.g., seconds, minutes, hours)     Hours 6. SEVERITY: "How bad is the pain?"  (e.g., Scale 1-10; mild, moderate, or severe)    - MILD (1-3): doesn't interfere with normal activities     - MODERATE (4-7): interferes with normal activities or awakens from sleep    - SEVERE (8-10): excruciating pain, unable to do any normal activities       8 7. CARDIAC RISK FACTORS: "Do you have any history of heart problems or risk factors for heart disease?" (e.g., angina, prior heart attack; diabetes, high blood pressure, high cholesterol, smoker, or strong family history of heart disease)     No 8. PULMONARY RISK FACTORS: "Do you have any history of lung disease?"  (e.g., blood clots in lung, asthma, emphysema, birth control pills)     No 9. CAUSE: "What do you think is causing the chest pain?"     Reflux 10. OTHER SYMPTOMS: "Do you have any other symptoms?" (e.g., dizziness, nausea, vomiting, sweating, fever, difficulty breathing, cough)       No 11. PREGNANCY: "Is there any chance you are  pregnant?" "When was your last menstrual period?"       No  Protocols used: CHEST PAIN-A-AH

## 2019-11-19 NOTE — Telephone Encounter (Signed)
Please contact pt to schedule OV.  

## 2020-01-08 ENCOUNTER — Other Ambulatory Visit: Payer: Self-pay | Admitting: Internal Medicine

## 2020-01-30 ENCOUNTER — Other Ambulatory Visit: Payer: Self-pay | Admitting: Internal Medicine

## 2020-03-16 ENCOUNTER — Other Ambulatory Visit: Payer: Self-pay | Admitting: Internal Medicine

## 2020-03-29 ENCOUNTER — Telehealth: Payer: Self-pay | Admitting: Internal Medicine

## 2020-03-29 NOTE — Telephone Encounter (Signed)
New message:   Pt is calling and states she needs an up to date jury duty letter that states she can't do jury duty. She states it is supposed to be on 05/17/20 and juror [phone]. Please advise.  She states the number to call them is [phone] to get a fax number for the office.

## 2020-03-30 NOTE — Telephone Encounter (Signed)
Letter printed/pending MD signature.

## 2020-03-30 NOTE — Telephone Encounter (Signed)
Called number below. No way to get a fax # to send the letter. Per patient's request, signed letter was mailed to pt's home address.

## 2020-03-30 NOTE — Telephone Encounter (Signed)
Misty Stanley,  please write a letter.  Thanks

## 2020-08-15 ENCOUNTER — Other Ambulatory Visit: Payer: Self-pay | Admitting: Internal Medicine

## 2020-08-25 ENCOUNTER — Other Ambulatory Visit: Payer: Self-pay | Admitting: Internal Medicine

## 2020-09-02 ENCOUNTER — Telehealth: Payer: Self-pay | Admitting: Internal Medicine

## 2020-09-02 NOTE — Telephone Encounter (Signed)
LVM for pt to rtn my call to schedule AWV with [facility]. If patient calls the off please schedule appointment with [facility] .

## 2020-09-08 ENCOUNTER — Ambulatory Visit (INDEPENDENT_AMBULATORY_CARE_PROVIDER_SITE_OTHER): Payer: Medicare Other | Admitting: Internal Medicine

## 2020-09-08 ENCOUNTER — Other Ambulatory Visit: Payer: Self-pay

## 2020-09-08 ENCOUNTER — Ambulatory Visit: Payer: Medicare Other | Admitting: Internal Medicine

## 2020-09-08 ENCOUNTER — Encounter: Payer: Self-pay | Admitting: Internal Medicine

## 2020-09-08 DIAGNOSIS — I10 Essential (primary) hypertension: Secondary | ICD-10-CM | POA: Diagnosis not present

## 2020-09-08 DIAGNOSIS — F988 Other specified behavioral and emotional disorders with onset usually occurring in childhood and adolescence: Secondary | ICD-10-CM

## 2020-09-08 DIAGNOSIS — R5382 Chronic fatigue, unspecified: Secondary | ICD-10-CM

## 2020-09-08 DIAGNOSIS — F411 Generalized anxiety disorder: Secondary | ICD-10-CM | POA: Diagnosis not present

## 2020-09-08 LAB — CBC WITH DIFFERENTIAL/PLATELET
Basophils Absolute: 0 10*3/uL (ref 0.0–0.1)
Basophils Relative: 0.4 % (ref 0.0–3.0)
Eosinophils Absolute: 0.1 10*3/uL (ref 0.0–0.7)
Eosinophils Relative: 1 % (ref 0.0–5.0)
HCT: 35.8 % — ABNORMAL LOW (ref 36.0–46.0)
Hemoglobin: 12 g/dL (ref 12.0–15.0)
Lymphocytes Relative: 23.7 % (ref 12.0–46.0)
Lymphs Abs: 1.4 10*3/uL (ref 0.7–4.0)
MCHC: 33.4 g/dL (ref 30.0–36.0)
MCV: 86.7 fl (ref 78.0–100.0)
Monocytes Absolute: 0.3 10*3/uL (ref 0.1–1.0)
Monocytes Relative: 5.1 % (ref 3.0–12.0)
Neutro Abs: 4.2 10*3/uL (ref 1.4–7.7)
Neutrophils Relative %: 69.8 % (ref 43.0–77.0)
Platelets: 267 10*3/uL (ref 150.0–400.0)
RBC: 4.13 Mil/uL (ref 3.87–5.11)
RDW: 13.1 % (ref 11.5–15.5)
WBC: 6 10*3/uL (ref 4.0–10.5)

## 2020-09-08 LAB — COMPREHENSIVE METABOLIC PANEL
ALT: 12 U/L (ref 0–35)
AST: 17 U/L (ref 0–37)
Albumin: 4.1 g/dL (ref 3.5–5.2)
Alkaline Phosphatase: 115 U/L (ref 39–117)
BUN: 15 mg/dL (ref 6–23)
CO2: 28 mEq/L (ref 19–32)
Calcium: 9.3 mg/dL (ref 8.4–10.5)
Chloride: 105 mEq/L (ref 96–112)
Creatinine, Ser: 0.61 mg/dL (ref 0.40–1.20)
GFR: 87.04 mL/min (ref >60.00)
Glucose, Bld: 75 mg/dL (ref 70–99)
Potassium: 3.5 mEq/L (ref 3.5–5.1)
Sodium: 141 mEq/L (ref 135–145)
Total Bilirubin: 0.4 mg/dL (ref 0.2–1.2)
Total Protein: 7.2 g/dL (ref 6.0–8.3)

## 2020-09-08 LAB — URINALYSIS, ROUTINE W REFLEX MICROSCOPIC
Bilirubin Urine: NEGATIVE
Hgb urine dipstick: NEGATIVE
Ketones, ur: NEGATIVE
Nitrite: NEGATIVE
Specific Gravity, Urine: 1.02 (ref 1.000–1.030)
Total Protein, Urine: NEGATIVE
Urine Glucose: NEGATIVE
Urobilinogen, UA: 0.2 (ref 0.0–1.0)
pH: 6 (ref 5.0–8.0)

## 2020-09-08 LAB — LIPID PANEL
Cholesterol: 212 mg/dL — ABNORMAL HIGH (ref 0–200)
HDL: 66.3 mg/dL (ref >39.00)
LDL Cholesterol: 127 mg/dL — ABNORMAL HIGH (ref 0–99)
NonHDL: 145.65
Total CHOL/HDL Ratio: 3
Triglycerides: 92 mg/dL (ref 0.0–149.0)
VLDL: 18.4 mg/dL (ref 0.0–40.0)

## 2020-09-08 LAB — TSH: TSH: 2.6 u[IU]/mL (ref 0.35–4.50)

## 2020-09-08 MED ORDER — PANTOPRAZOLE SODIUM 40 MG PO TBEC
DELAYED_RELEASE_TABLET | ORAL | 3 refills | Status: DC
Start: 2020-09-08 — End: 2021-04-20

## 2020-09-08 MED ORDER — GALANTAMINE HYDROBROMIDE 12 MG PO TABS
12.0000 mg | ORAL_TABLET | Freq: Two times a day (BID) | ORAL | 3 refills | Status: DC
Start: 2020-09-08 — End: 2021-09-27

## 2020-09-08 MED ORDER — TRAMADOL HCL 50 MG PO TABS: ORAL_TABLET | ORAL | 1 refills | Status: DC

## 2020-09-08 MED ORDER — ALPRAZOLAM 0.5 MG PO TABS
ORAL_TABLET | ORAL | 3 refills | Status: DC
Start: 2020-09-08 — End: 2021-03-14

## 2020-09-08 NOTE — Patient Instructions (Signed)
Nature's Lab Mushroom 7 1,500 mg., 180 Vegetarian Capsules -- [facility] (You can try Lion's Mane Mushroom extract or capsules for memory)

## 2020-09-08 NOTE — Assessment & Plan Note (Signed)
HCT

## 2020-09-08 NOTE — Assessment & Plan Note (Signed)
Xanax prn  Potential benefits of a long term benzodiazepines  use as well as potential risks  and complications were explained to the patient and were aknowledged. 

## 2020-09-08 NOTE — Assessment & Plan Note (Signed)
try Lion's Mane Mushroom extract or capsules for memory 

## 2020-09-08 NOTE — Assessment & Plan Note (Signed)
Doing better.   

## 2020-09-08 NOTE — Progress Notes (Signed)
Subjective:  Patient ID: [name], female    DOB: [birth year]  Age: 74 y.o. MRN: [id number]  CC: Annual Exam   HPI CHIOMA MUKHERJEE presents for OA, memory issues, anxiety - worse w/cabin fever Lost wt on diet  Outpatient Medications Prior to Visit  Medication Sig Dispense Refill  . ALPRAZolam (XANAX) 0.5 MG tablet TAKE 1 TABLET(0.5 MG) BY MOUTH AT BEDTIME AS NEEDED FOR ANXIETY OR SLEEP 90 tablet 3  . Chlorpheniramine-Acetaminophen (CORICIDIN HBP COLD/FLU PO) Take 1 tablet by mouth daily as needed (cold).    . Cholecalciferol (VITAMIN D3) 2000 UNITS capsule Take 1 capsule (2,000 Units total) by mouth daily. (Patient taking differently: Take 2,000 Units by mouth 3 (three) times a week. ) 100 capsule 3  . galantamine (RAZADYNE) 12 MG tablet Take 1 tablet (12 mg total) by mouth 2 (two) times daily. Overdue for Annual appt must see provider for future refills 60 tablet 0  . hydrochlorothiazide (MICROZIDE) 12.5 MG capsule TAKE ONE CAPSULE BY MOUTH EVERY DAY(OFFICE VISIT NEEDED FOR FURTHER REFILLS) 90 capsule 3  . ibuprofen (ADVIL,MOTRIN) 600 MG tablet Take 1 tablet (600 mg total) by mouth every 8 (eight) hours as needed for headache. 60 tablet 1  . Lysine 1000 MG TABS Take 1 tablet (1,000 mg total) by mouth daily. 90 tablet 3  . Multiple Vitamin (MULTI-VITAMIN PO) Take 1 tablet by mouth every morning.     . pantoprazole (PROTONIX) 40 MG tablet TAKE 1 TABLET(40 MG) BY MOUTH DAILY 90 tablet 3  . Peppermint Oil (IBGARD PO) Take by mouth.    . polyethylene glycol powder (GLYCOLAX/MIRALAX) powder Take 17 g by mouth 2 (two) times daily as needed. 500 g 3  . Probiotic Product (ULTRAFLORA IMMUNE HEALTH) 170 MG CAPS TAKE (1) CAPSULE DAILY. 60 capsule PRN  . traMADol (ULTRAM) 50 MG tablet TAKE 1 TO 2 TABLETS BY MOUTH TWICE DAILY AS NEEDED FOR PAIN. Office visit needed before refills will be given 60 tablet 1   No facility-administered medications prior to visit.    ROS: Review of Systems   Constitutional: Negative for activity change, appetite change, chills, fatigue and unexpected weight change.  HENT: Negative for congestion, mouth sores and sinus pressure.   Eyes: Negative for visual disturbance.  Respiratory: Negative for cough and chest tightness.   Gastrointestinal: Negative for abdominal pain and nausea.  Genitourinary: Negative for difficulty urinating, frequency and vaginal pain.  Musculoskeletal: Negative for back pain and gait problem.  Skin: Negative for pallor and rash.  Neurological: Negative for dizziness, tremors, weakness, numbness and headaches.  Psychiatric/Behavioral: Negative for confusion and sleep disturbance.    Objective:  BP (!) 142/86 (BP Location: Left Arm)   Pulse 86   Temp 98.6 F (37 C) (Oral)   Wt 170 lb (77.1 kg)   SpO2 97%   BMI 26.63 kg/m   BP Readings from Last 3 Encounters:  09/08/20 (!) 142/86  12/03/18 124/82  07/17/18 120/78    Wt Readings from Last 3 Encounters:  09/08/20 170 lb (77.1 kg)  12/03/18 184 lb (83.5 kg)  07/17/18 191 lb (86.6 kg)    Physical Exam Constitutional:      General: She is not in acute distress.    Appearance: She is well-developed.  HENT:     Head: Normocephalic.     Right Ear: External ear normal.     Left Ear: External ear normal.     Nose: Nose normal.  Eyes:     General:  Right eye: No discharge.        Left eye: No discharge.     Conjunctiva/sclera: Conjunctivae normal.     Pupils: Pupils are equal, round, and reactive to light.  Neck:     Thyroid: No thyromegaly.     Vascular: No JVD.     Trachea: No tracheal deviation.  Cardiovascular:     Rate and Rhythm: Normal rate and regular rhythm.     Heart sounds: Normal heart sounds.  Pulmonary:     Effort: No respiratory distress.     Breath sounds: No stridor. No wheezing.  Abdominal:     General: Bowel sounds are normal. There is no distension.     Palpations: Abdomen is soft. There is no mass.     Tenderness: There  is no abdominal tenderness. There is no guarding or rebound.  Musculoskeletal:        General: No tenderness.     Cervical back: Normal range of motion and neck supple.  Lymphadenopathy:     Cervical: No cervical adenopathy.  Skin:    Findings: No erythema or rash.  Neurological:     Cranial Nerves: No cranial nerve deficit.     Motor: No abnormal muscle tone.     Coordination: Coordination normal.     Deep Tendon Reflexes: Reflexes normal.  Psychiatric:        Behavior: Behavior normal.        Thought Content: Thought content normal.        Judgment: Judgment normal.     Lab Results  Component Value Date   WBC 7.0 07/17/2018   HGB 12.4 07/17/2018   HCT 37.1 07/17/2018   PLT 250.0 07/17/2018   GLUCOSE 92 07/17/2018   CHOL 239 (H) 04/30/2018   TRIG 139.0 04/30/2018   HDL 59.60 04/30/2018   LDLDIRECT 152.9 10/14/2012   LDLCALC 151 (H) 04/30/2018   ALT 15 07/17/2018   AST 20 07/17/2018   NA 139 07/17/2018   K 3.7 07/17/2018   CL 104 07/17/2018   CREATININE 0.77 07/17/2018   BUN 15 07/17/2018   CO2 27 07/17/2018   TSH 1.58 07/17/2018   INR 0.98 09/09/2016    CT CARDIAC SCORING  Addendum Date: 12/26/2018   ADDENDUM REPORT: 12/26/2018 17:20 CLINICAL DATA:  Risk stratification EXAM: Coronary Calcium Score TECHNIQUE: The patient was scanned on a Siemens Somatom 64 slice scanner. Axial non-contrast 3 mm slices were carried out through the heart. The data set was analyzed on a dedicated work station and scored using the Agatson method. FINDINGS: Non-cardiac: See separate report from [facility]. Ascending aorta: Normal diameter 3.5 cm Pericardium: Normal Coronary arteries: One small punctate area of calcium in mid LAD IMPRESSION: Coronary calcium score of 1. This was 56 th percentile for age and sex matched control. Charlton Haws Electronically Signed   By: Charlton Haws M.D.   On: 12/26/2018 17:20   Result Date: 12/26/2018 EXAM: OVER-READ INTERPRETATION  CT CHEST The  following report is an over-read performed by radiologist Dr. Trudie Reed of [facility] on 12/26/2018. This over-read does not include interpretation of cardiac or coronary anatomy or pathology. The coronary calcium score interpretation by the cardiologist is attached. COMPARISON:  None. FINDINGS: Within the visualized portions of the thorax there are no suspicious appearing pulmonary nodules or masses, there is no acute consolidative airspace disease, no pleural effusions, no pneumothorax and no lymphadenopathy. Visualized portions of the upper abdomen demonstrate a and 8 mm low-attenuation lesion in  segment 8 of the liver, incompletely characterized on today's noncontrast CT examination, but stable compared to prior CT the abdomen and pelvis 07/31/2018, statistically likely to represent a cyst. There are no aggressive appearing lytic or blastic lesions noted in the visualized portions of the skeleton. IMPRESSION: 1. No significant incidental noncardiac findings are noted. Electronically Signed: By: Trudie Reed M.D. On: 12/26/2018 14:08    Assessment & Plan:     Follow-up: No follow-ups on file.  Sonda Primes, MD

## 2020-09-09 ENCOUNTER — Other Ambulatory Visit: Payer: Self-pay | Admitting: Internal Medicine

## 2020-09-09 MED ORDER — CEFUROXIME AXETIL 250 MG PO TABS: 250.0000 mg | ORAL_TABLET | Freq: Two times a day (BID) | ORAL | 0 refills | Status: DC

## 2020-12-13 ENCOUNTER — Telehealth: Payer: Self-pay | Admitting: Internal Medicine

## 2020-12-13 NOTE — Telephone Encounter (Signed)
Called pt to schedule AWV with [facility]. Pt states she will call back to schedule this appt.

## 2021-01-12 ENCOUNTER — Telehealth: Payer: Self-pay | Admitting: Internal Medicine

## 2021-01-12 NOTE — Telephone Encounter (Signed)
LVM for pt to rtn my call to schedule AWV with [facility]. Please schedule appt if pt calls the office.  

## 2021-01-13 IMAGING — CT CT HEART SCORING
2 series · 16 of 20 positions shown, 18 images · non-contrast
Comparison: None.

Addendum:
EXAM:
OVER-READ INTERPRETATION  CT CHEST

The following report is an over-read performed by radiologist Dr.
Masanori Kamebuchi [REDACTED] on 12/26/2018. This
over-read does not include interpretation of cardiac or coronary
anatomy or pathology. The coronary calcium score interpretation by
the cardiologist is attached.
CLINICAL DATA: Risk stratification
Coronary Calcium Score
TECHNIQUE: The patient was scanned on a Siemens Somatom 64 slice scanner. Axial
non-contrast 3 mm slices were carried out through the heart. The
data set was analyzed on a dedicated work station and scored using
the Agatson method.

[Series 3: casc 3.0 i36f 2 bestdiast 71 % · axial · 0.33mm/px · z∈[-206,-101]mm · 8 of 45 slices shown, 10 images]
[im 5/45  vessel]
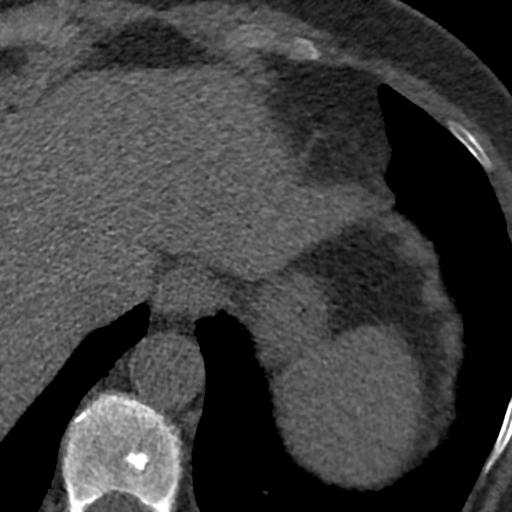
[im 5/45  lung]
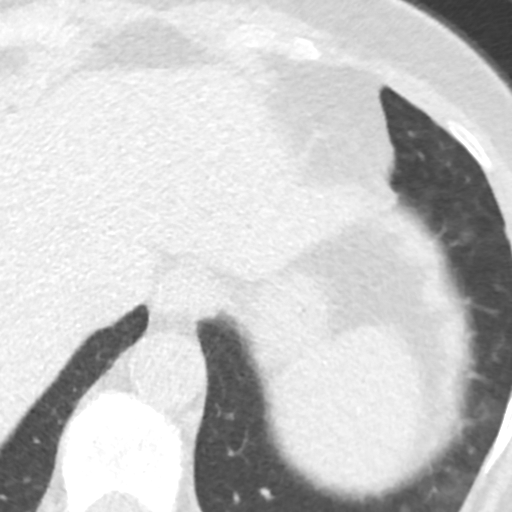
[im 10/45  vessel]
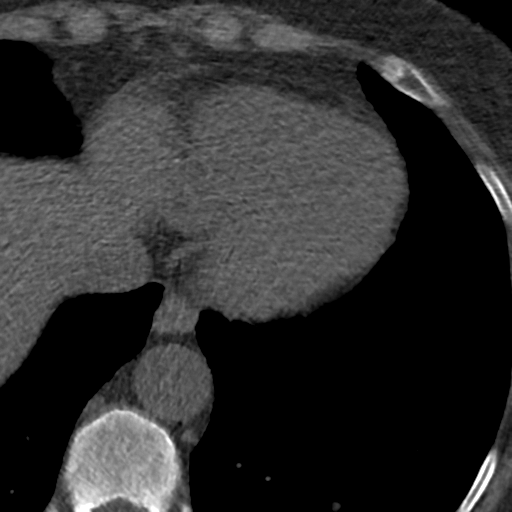
[im 15/45  vessel]
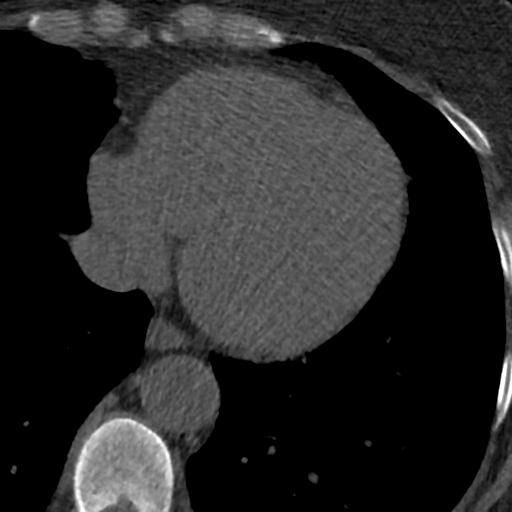
[im 20/45  vessel]
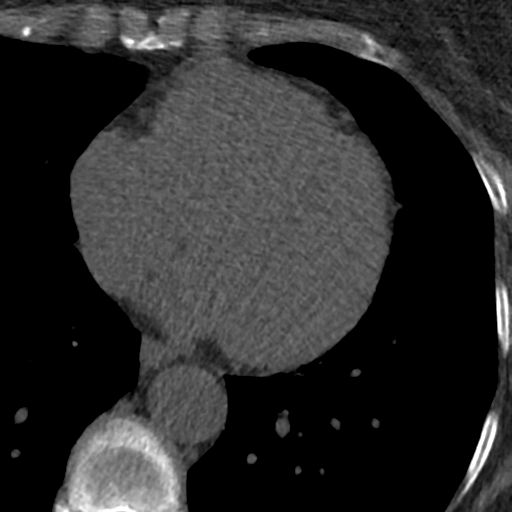
[im 25/45  vessel]
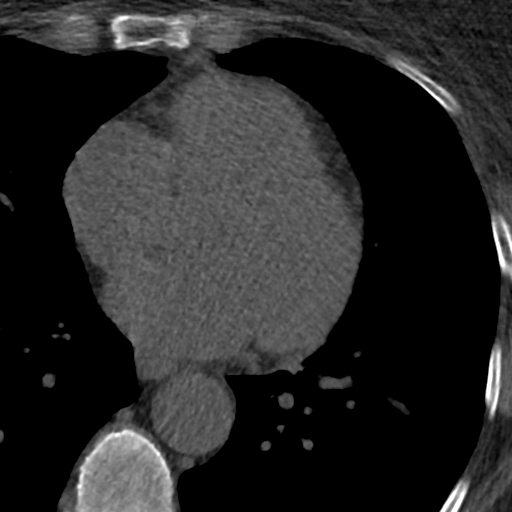
[im 25/45  lung]
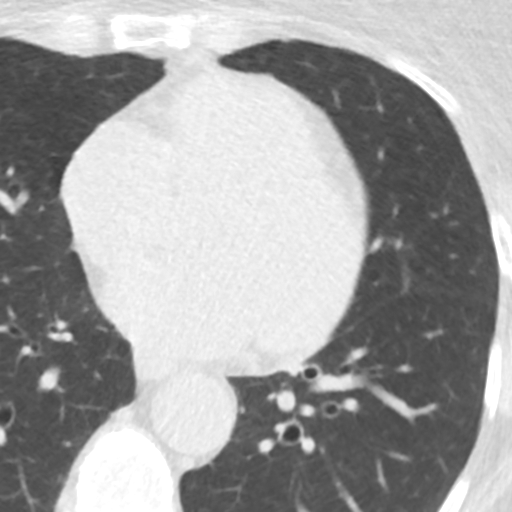
[im 30/45  vessel]
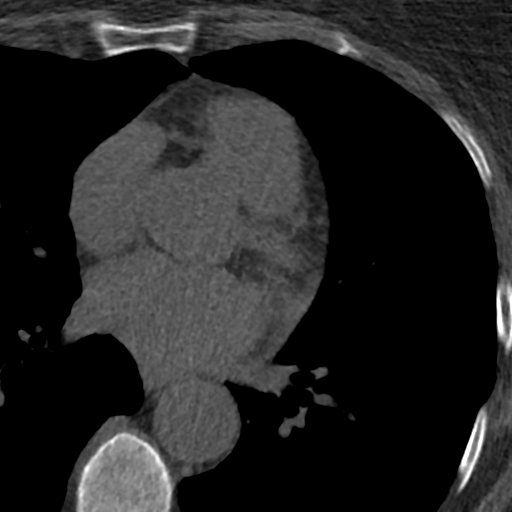
[im 35/45  vessel]
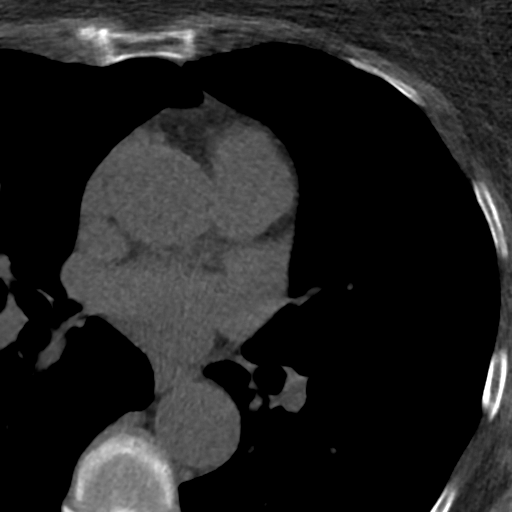
[im 40/45  vessel]
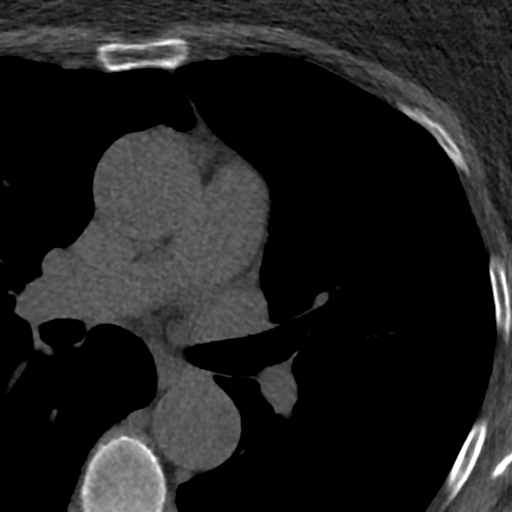

[Series 5: lung st 70 % · axial · 0.63mm/px · z∈[-206,-101]mm · 8 of 45 slices shown]
[im 5/45  lung]
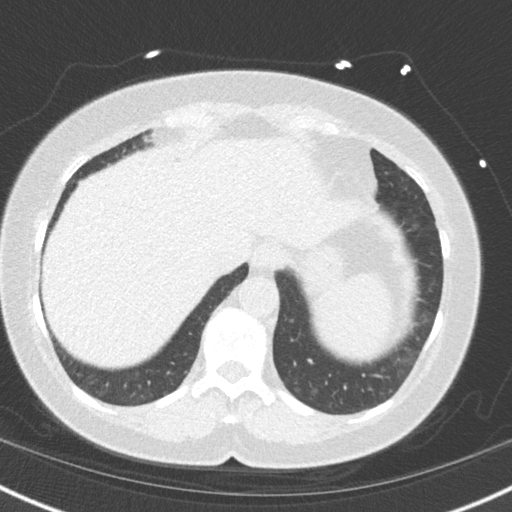
[im 10/45  lung]
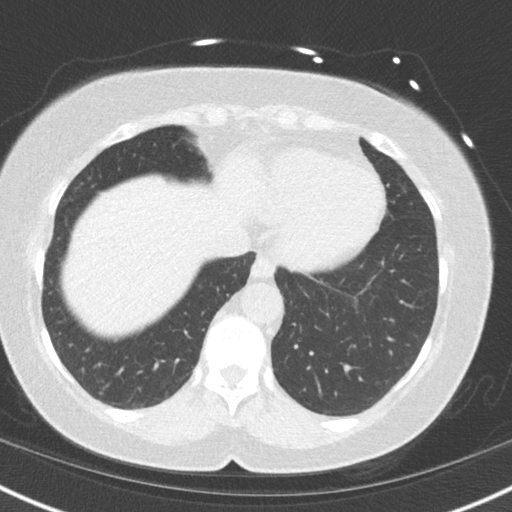
[im 15/45  lung]
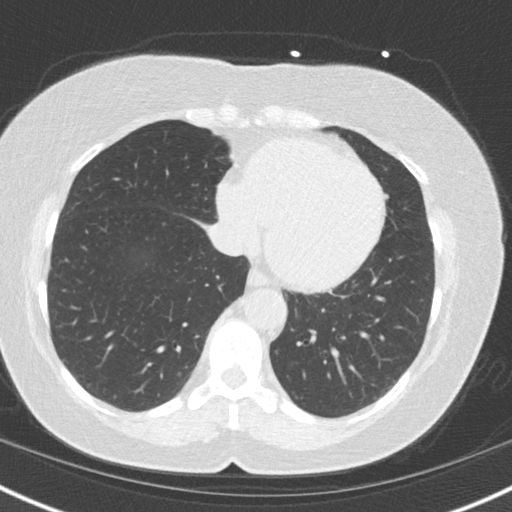
[im 20/45  lung]
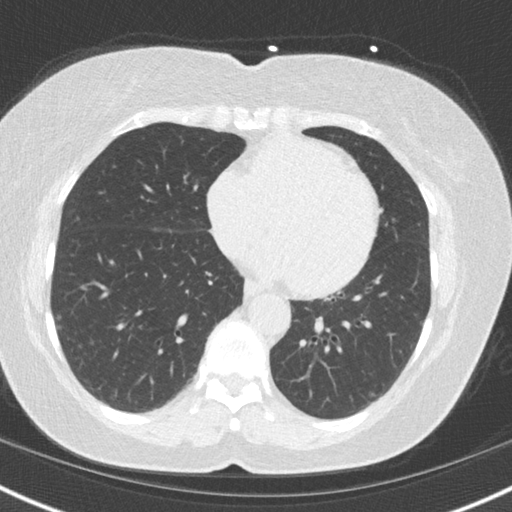
[im 25/45  lung]
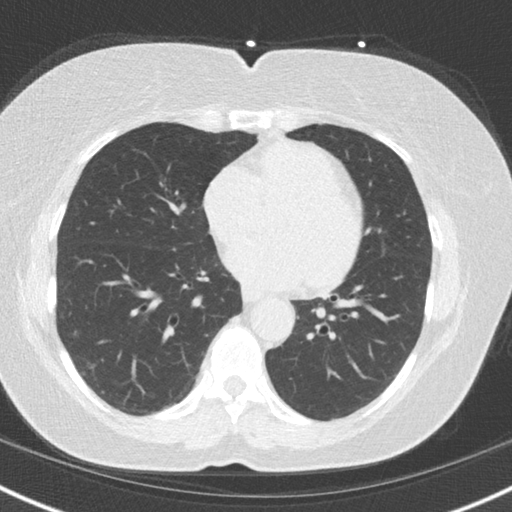
[im 30/45  lung]
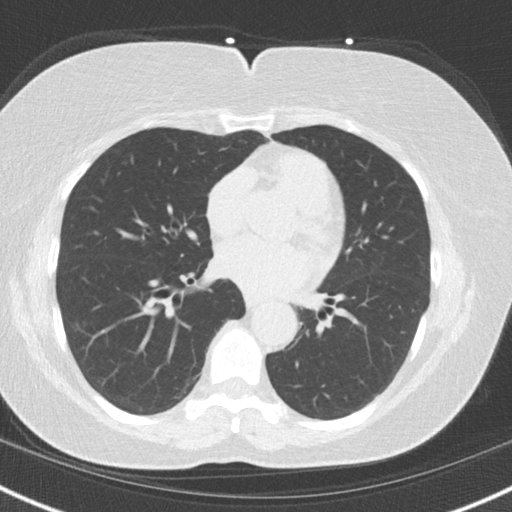
[im 35/45  lung]
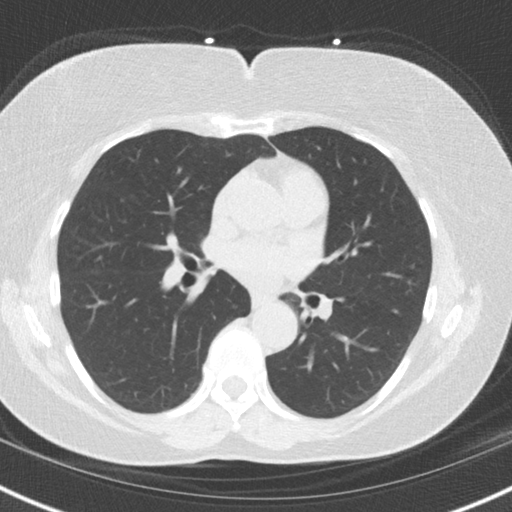
[im 40/45  lung]
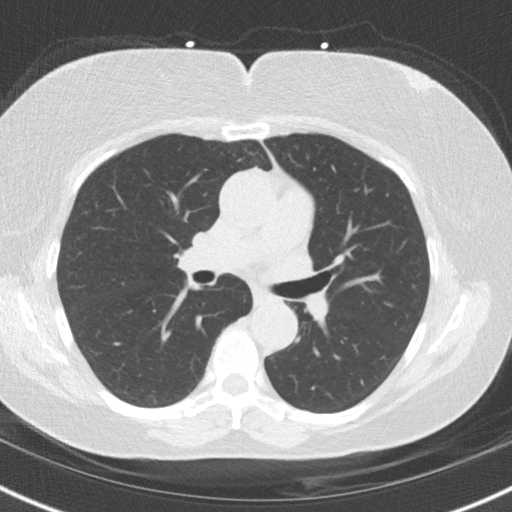

[16 of 20 positions shown; findings below may reference images not displayed]

FINDINGS: Within the visualized portions of the thorax there are no suspicious
appearing pulmonary nodules or masses, there is no acute
consolidative airspace disease, no pleural effusions, no
pneumothorax and no lymphadenopathy. Visualized portions of the
upper abdomen demonstrate a and 8 mm low-attenuation lesion in
segment 8 of the liver, incompletely characterized on today's
noncontrast CT examination, but stable compared to prior CT the
abdomen and pelvis 07/31/2018, statistically likely to represent a
cyst. There are no aggressive appearing lytic or blastic lesions
noted in the visualized portions of the skeleton.
IMPRESSION: 1. No significant incidental noncardiac findings are noted.
FINDINGS: Non-cardiac: See separate report from [REDACTED].

Ascending aorta: Normal diameter 3.5 cm

Pericardium: Normal

Coronary arteries: One small punctate area of calcium in mid LAD
IMPRESSION: Coronary calcium score of 1. This was 29 th percentile for age and
sex matched control.

Ti Case

*** End of Addendum ***

## 2021-03-07 ENCOUNTER — Other Ambulatory Visit: Payer: Self-pay

## 2021-03-08 ENCOUNTER — Ambulatory Visit (INDEPENDENT_AMBULATORY_CARE_PROVIDER_SITE_OTHER): Payer: Medicare Other | Admitting: Internal Medicine

## 2021-03-08 ENCOUNTER — Encounter: Payer: Self-pay | Admitting: Internal Medicine

## 2021-03-08 ENCOUNTER — Other Ambulatory Visit: Payer: Self-pay

## 2021-03-08 VITALS — BP 138/80 | HR 73 | Temp 98.3°F | Ht 67.0 in | Wt 170.2 lb

## 2021-03-08 DIAGNOSIS — F988 Other specified behavioral and emotional disorders with onset usually occurring in childhood and adolescence: Secondary | ICD-10-CM | POA: Diagnosis not present

## 2021-03-08 DIAGNOSIS — R0789 Other chest pain: Secondary | ICD-10-CM | POA: Diagnosis not present

## 2021-03-08 DIAGNOSIS — K219 Gastro-esophageal reflux disease without esophagitis: Secondary | ICD-10-CM

## 2021-03-08 DIAGNOSIS — E785 Hyperlipidemia, unspecified: Secondary | ICD-10-CM

## 2021-03-08 DIAGNOSIS — F411 Generalized anxiety disorder: Secondary | ICD-10-CM | POA: Diagnosis not present

## 2021-03-08 DIAGNOSIS — I10 Essential (primary) hypertension: Secondary | ICD-10-CM

## 2021-03-08 LAB — CBC WITH DIFFERENTIAL/PLATELET
Basophils Absolute: 0 10*3/uL (ref 0.0–0.1)
Basophils Relative: 0.5 % (ref 0.0–3.0)
Eosinophils Absolute: 0.1 10*3/uL (ref 0.0–0.7)
Eosinophils Relative: 1.5 % (ref 0.0–5.0)
HCT: 36.4 % (ref 36.0–46.0)
Hemoglobin: 12.2 g/dL (ref 12.0–15.0)
Lymphocytes Relative: 28.4 % (ref 12.0–46.0)
Lymphs Abs: 1.4 10*3/uL (ref 0.7–4.0)
MCHC: 33.6 g/dL (ref 30.0–36.0)
MCV: 87.6 fl (ref 78.0–100.0)
Monocytes Absolute: 0.3 10*3/uL (ref 0.1–1.0)
Monocytes Relative: 5.9 % (ref 3.0–12.0)
Neutro Abs: 3.2 10*3/uL (ref 1.4–7.7)
Neutrophils Relative %: 63.7 % (ref 43.0–77.0)
Platelets: 247 10*3/uL (ref 150.0–400.0)
RBC: 4.16 Mil/uL (ref 3.87–5.11)
RDW: 13 % (ref 11.5–15.5)
WBC: 5 10*3/uL (ref 4.0–10.5)

## 2021-03-08 LAB — COMPREHENSIVE METABOLIC PANEL
ALT: 10 U/L (ref 0–35)
AST: 16 U/L (ref 0–37)
Albumin: 4.2 g/dL (ref 3.5–5.2)
Alkaline Phosphatase: 119 U/L — ABNORMAL HIGH (ref 39–117)
BUN: 17 mg/dL (ref 6–23)
CO2: 29 mEq/L (ref 19–32)
Calcium: 9.6 mg/dL (ref 8.4–10.5)
Chloride: 104 mEq/L (ref 96–112)
Creatinine, Ser: 0.69 mg/dL (ref 0.40–1.20)
GFR: 84.2 mL/min (ref >60.00)
Glucose, Bld: 76 mg/dL (ref 70–99)
Potassium: 3.8 mEq/L (ref 3.5–5.1)
Sodium: 141 mEq/L (ref 135–145)
Total Bilirubin: 0.7 mg/dL (ref 0.2–1.2)
Total Protein: 7.1 g/dL (ref 6.0–8.3)

## 2021-03-08 NOTE — Progress Notes (Signed)
Subjective:  Patient ID: Shelley Bray, female    DOB: Mar 24, 1946  Age: 75 y.o. MRN: [id number]  CC: Follow-up (6 month f/u)   HPI [facility] presents for memory issues, anxiety, OA. C/o CP, acid indigestion that can last all day x 1 week. Pt stopped Pantoprazole 1 month ago.   Outpatient Medications Prior to Visit  Medication Sig Dispense Refill  . ALPRAZolam (XANAX) 0.5 MG tablet TAKE 1 TABLET(0.5 MG) BY MOUTH AT BEDTIME AS NEEDED FOR ANXIETY OR SLEEP 90 tablet 3  . Cholecalciferol (VITAMIN D3) 2000 UNITS capsule Take 1 capsule (2,000 Units total) by mouth daily. (Patient taking differently: Take 2,000 Units by mouth 3 (three) times a week.) 100 capsule 3  . galantamine (RAZADYNE) 12 MG tablet Take 1 tablet (12 mg total) by mouth 2 (two) times daily. Overdue for Annual appt must see provider for future refills 180 tablet 3  . hydrochlorothiazide (MICROZIDE) 12.5 MG capsule TAKE ONE CAPSULE BY MOUTH EVERY DAY(OFFICE VISIT NEEDED FOR FURTHER REFILLS) 90 capsule 3  . ibuprofen (ADVIL,MOTRIN) 600 MG tablet Take 1 tablet (600 mg total) by mouth every 8 (eight) hours as needed for headache. 60 tablet 1  . Lysine 1000 MG TABS Take 1 tablet (1,000 mg total) by mouth daily. 90 tablet 3  . Multiple Vitamin (MULTI-VITAMIN PO) Take 1 tablet by mouth every morning.     Marland Kitchen Peppermint Oil (IBGARD PO) Take by mouth.    . polyethylene glycol powder (GLYCOLAX/MIRALAX) powder Take 17 g by mouth 2 (two) times daily as needed. 500 g 3  . Probiotic Product (ULTRAFLORA IMMUNE HEALTH) 170 MG CAPS TAKE (1) CAPSULE DAILY. 60 capsule PRN  . traMADol (ULTRAM) 50 MG tablet TAKE 1 TO 2 TABLETS BY MOUTH TWICE DAILY AS NEEDED FOR PAIN. Office visit needed before refills will be given 180 tablet 1  . pantoprazole (PROTONIX) 40 MG tablet TAKE 1 TABLET(40 MG) BY MOUTH DAILY (Patient not taking: Reported on 03/08/2021) 90 tablet 3  . cefUROXime (CEFTIN) 250 MG tablet Take 1 tablet (250 mg total) by mouth 2 (two) times  daily with a meal. 8 tablet 0  . Chlorpheniramine-Acetaminophen (CORICIDIN HBP COLD/FLU PO) Take 1 tablet by mouth daily as needed (cold). (Patient not taking: Reported on 03/08/2021)     No facility-administered medications prior to visit.    ROS: Review of Systems  Constitutional: Negative for activity change, appetite change, chills, fatigue and unexpected weight change.  HENT: Negative for congestion, mouth sores and sinus pressure.   Eyes: Negative for visual disturbance.  Respiratory: Positive for chest tightness. Negative for cough and shortness of breath.   Gastrointestinal: Negative for abdominal pain and nausea.  Genitourinary: Negative for difficulty urinating, frequency and vaginal pain.  Musculoskeletal: Negative for back pain and gait problem.  Skin: Negative for pallor and rash.  Neurological: Negative for dizziness, tremors, weakness, numbness and headaches.  Psychiatric/Behavioral: Negative for confusion and sleep disturbance.    Objective:  BP 138/80 (BP Location: Left Arm)   Pulse 73   Temp 98.3 F (36.8 C) (Oral)   Ht 5\' 7"  (1.702 m)   Wt 170 lb 3.2 oz (77.2 kg)   SpO2 99%   BMI 26.66 kg/m   BP Readings from Last 3 Encounters:  03/08/21 138/80  09/08/20 (!) 142/86  12/03/18 124/82    Wt Readings from Last 3 Encounters:  03/08/21 170 lb 3.2 oz (77.2 kg)  09/08/20 170 lb (77.1 kg)  12/03/18 184 lb (83.5 kg)  Physical Exam Constitutional:      General: She is not in acute distress.    Appearance: She is well-developed.  HENT:     Head: Normocephalic.     Right Ear: External ear normal.     Left Ear: External ear normal.     Nose: Nose normal.  Eyes:     General:        Right eye: No discharge.        Left eye: No discharge.     Conjunctiva/sclera: Conjunctivae normal.     Pupils: Pupils are equal, round, and reactive to light.  Neck:     Thyroid: No thyromegaly.     Vascular: No JVD.     Trachea: No tracheal deviation.  Cardiovascular:      Rate and Rhythm: Normal rate and regular rhythm.     Heart sounds: Normal heart sounds.  Pulmonary:     Effort: No respiratory distress.     Breath sounds: No stridor. No wheezing.  Abdominal:     General: Bowel sounds are normal. There is no distension.     Palpations: Abdomen is soft. There is no mass.     Tenderness: There is no abdominal tenderness. There is no guarding or rebound.  Musculoskeletal:        General: No tenderness.     Cervical back: Normal range of motion and neck supple.  Lymphadenopathy:     Cervical: No cervical adenopathy.  Skin:    Findings: No erythema or rash.  Neurological:     Cranial Nerves: No cranial nerve deficit.     Motor: No abnormal muscle tone.     Coordination: Coordination normal.     Deep Tendon Reflexes: Reflexes normal.  Psychiatric:        Behavior: Behavior normal.        Thought Content: Thought content normal.        Judgment: Judgment normal.     Lab Results  Component Value Date   WBC 6.0 09/08/2020   HGB 12.0 09/08/2020   HCT 35.8 (L) 09/08/2020   PLT 267.0 09/08/2020   GLUCOSE 75 09/08/2020   CHOL 212 (H) 09/08/2020   TRIG 92.0 09/08/2020   HDL 66.30 09/08/2020   LDLDIRECT 152.9 10/14/2012   LDLCALC 127 (H) 09/08/2020   ALT 12 09/08/2020   AST 17 09/08/2020   NA 141 09/08/2020   K 3.5 09/08/2020   CL 105 09/08/2020   CREATININE 0.61 09/08/2020   BUN 15 09/08/2020   CO2 28 09/08/2020   TSH 2.60 09/08/2020   INR 0.98 09/09/2016    CT CARDIAC SCORING  Addendum Date: 12/26/2018   ADDENDUM REPORT: 12/26/2018 17:20 CLINICAL DATA:  Risk stratification EXAM: Coronary Calcium Score TECHNIQUE: The patient was scanned on a Siemens Somatom 64 slice scanner. Axial non-contrast 3 mm slices were carried out through the heart. The data set was analyzed on a dedicated work station and scored using the Agatson method. FINDINGS: Non-cardiac: See separate report from [facility]. Ascending aorta: Normal diameter 3.5 cm  Pericardium: Normal Coronary arteries: One small punctate area of calcium in mid LAD IMPRESSION: Coronary calcium score of 1. This was 76 th percentile for age and sex matched control. [facility] Electronically Signed   By: Shelley Bray M.D.   On: 12/26/2018 17:20   Result Date: 12/26/2018 EXAM: OVER-READ INTERPRETATION  CT CHEST The following report is an over-read performed by radiologist Dr. Trudie Reed of [facility] on 12/26/2018. This over-read  does not include interpretation of cardiac or coronary anatomy or pathology. The coronary calcium score interpretation by the cardiologist is attached. COMPARISON:  None. FINDINGS: Within the visualized portions of the thorax there are no suspicious appearing pulmonary nodules or masses, there is no acute consolidative airspace disease, no pleural effusions, no pneumothorax and no lymphadenopathy. Visualized portions of the upper abdomen demonstrate a and 8 mm low-attenuation lesion in segment 8 of the liver, incompletely characterized on today's noncontrast CT examination, but stable compared to prior CT the abdomen and pelvis 07/31/2018, statistically likely to represent a cyst. There are no aggressive appearing lytic or blastic lesions noted in the visualized portions of the skeleton. IMPRESSION: 1. No significant incidental noncardiac findings are noted. Electronically Signed: By: Trudie Reed M.D. On: 12/26/2018 14:08    Assessment & Plan:     Sonda Primes, MD

## 2021-03-08 NOTE — Assessment & Plan Note (Signed)
GI ref if noot well - Dr Loreta Ave Re-start PPI Check CBC, CMET

## 2021-03-08 NOTE — Assessment & Plan Note (Signed)
Lion's Mane Mushroom extract or capsules for memory  

## 2021-03-08 NOTE — Addendum Note (Signed)
Addended by: Waldemar Dickens B on: 03/08/2021 08:48 AM   Modules accepted: Orders

## 2021-03-08 NOTE — Assessment & Plan Note (Signed)
  Cor calcium CT score is 1 - 2020

## 2021-03-08 NOTE — Assessment & Plan Note (Signed)
Xanax prn  Potential benefits of a long term benzodiazepines  use as well as potential risks  and complications were explained to the patient and were aknowledged. 

## 2021-03-08 NOTE — Assessment & Plan Note (Signed)
BP is nl at home HCTZ Cor calcium CT score is 1 - 2020

## 2021-03-08 NOTE — Assessment & Plan Note (Addendum)
Worse off PPI Re-start Protonix GI ref - Dr Loreta Ave

## 2021-03-12 ENCOUNTER — Other Ambulatory Visit: Payer: Self-pay | Admitting: Internal Medicine

## 2021-03-17 ENCOUNTER — Ambulatory Visit (INDEPENDENT_AMBULATORY_CARE_PROVIDER_SITE_OTHER): Payer: Medicare Other | Admitting: Internal Medicine

## 2021-03-17 ENCOUNTER — Other Ambulatory Visit: Payer: Self-pay

## 2021-03-17 ENCOUNTER — Encounter: Payer: Self-pay | Admitting: Internal Medicine

## 2021-03-17 VITALS — BP 168/92 | HR 77 | Temp 98.2°F | Ht 67.0 in | Wt 171.0 lb

## 2021-03-17 DIAGNOSIS — Z01818 Encounter for other preprocedural examination: Secondary | ICD-10-CM | POA: Diagnosis not present

## 2021-03-17 DIAGNOSIS — K219 Gastro-esophageal reflux disease without esophagitis: Secondary | ICD-10-CM

## 2021-03-17 DIAGNOSIS — G3184 Mild cognitive impairment, so stated: Secondary | ICD-10-CM

## 2021-03-17 DIAGNOSIS — F419 Anxiety disorder, unspecified: Secondary | ICD-10-CM

## 2021-03-17 DIAGNOSIS — M79673 Pain in unspecified foot: Secondary | ICD-10-CM | POA: Diagnosis not present

## 2021-03-17 DIAGNOSIS — I10 Essential (primary) hypertension: Secondary | ICD-10-CM

## 2021-03-17 DIAGNOSIS — E785 Hyperlipidemia, unspecified: Secondary | ICD-10-CM

## 2021-03-17 DIAGNOSIS — F988 Other specified behavioral and emotional disorders with onset usually occurring in childhood and adolescence: Secondary | ICD-10-CM | POA: Diagnosis not present

## 2021-03-17 LAB — PROTIME-INR
INR: 1.1 ratio — ABNORMAL HIGH (ref 0.8–1.0)
Prothrombin Time: 12.1 s (ref 9.6–13.1)

## 2021-03-17 LAB — APTT: aPTT: 28.8 s (ref 23.4–32.7)

## 2021-03-17 NOTE — Assessment & Plan Note (Signed)
Cor calcium CT score

## 2021-03-17 NOTE — Progress Notes (Signed)
Subjective:  Patient ID: [facility], female    DOB: 09/03/1946  Age: 75 y.o. MRN: [id number]  CC: Medical Clearance (Brow Lift- Scheduled for 03/30/21- Need EKG ) and Gastroesophageal Reflux (Pt states she also having indigestion for the past two days. Requesting MD to rx something )   HPI Shelley Bray presents for internal medicine surgical clearance Requested by Dr Pryor Ochoa Reason-medical clearance for eye brow lift surgery History: Shelley Bray has a history of GERD on therapy.  She was experiencing more indigestion following ingestion of spicy food.  Follow-up on hypertension, chronic arthritis pain  Past Medical History:  Diagnosis Date  . ANXIETY   . GERD   . HYPERLIPIDEMIA   . HYPERTENSION   . Memory loss    after son died in  2001/03/10  . SHELLFISH ALLERGY    Past Surgical History:  Procedure Laterality Date  . TUBAL LIGATION      reports that she has never smoked. She has never used smokeless tobacco. She reports that she does not drink alcohol and does not use drugs. family history includes Breast cancer in her sister; Cervical cancer in her sister; Heart attack (age of onset: 52) in her brother; Heart attack (age of onset: 69) in her father; Ovarian cancer in her sister. Allergies  Allergen Reactions  . Benadryl [Diphenhydramine Hcl]     cough  . Crestor [Rosuvastatin Calcium]     weak  . Diphenhydramine Other (See Comments)  . Other   . Shellfish Allergy      Outpatient Medications Prior to Visit  Medication Sig Dispense Refill  . ALPRAZolam (XANAX) 0.5 MG tablet TAKE 1 TABLET(0.5 MG) BY MOUTH AT BEDTIME AS NEEDED FOR ANXIETY OR SLEEP 90 tablet 1  . Cholecalciferol (VITAMIN D3) 2000 UNITS capsule Take 1 capsule (2,000 Units total) by mouth daily. (Patient taking differently: Take 2,000 Units by mouth 3 (three) times a week.) 100 capsule 3  . galantamine (RAZADYNE) 12 MG tablet Take 1 tablet (12 mg total) by mouth 2 (two) times daily. Overdue for Annual appt  must see provider for future refills 180 tablet 3  . hydrochlorothiazide (MICROZIDE) 12.5 MG capsule TAKE ONE CAPSULE BY MOUTH EVERY DAY(OFFICE VISIT NEEDED FOR FURTHER REFILLS) 90 capsule 3  . ibuprofen (ADVIL,MOTRIN) 600 MG tablet Take 1 tablet (600 mg total) by mouth every 8 (eight) hours as needed for headache. 60 tablet 1  . Lysine 1000 MG TABS Take 1 tablet (1,000 mg total) by mouth daily. 90 tablet 3  . Multiple Vitamin (MULTI-VITAMIN PO) Take 1 tablet by mouth every morning.     . pantoprazole (PROTONIX) 40 MG tablet TAKE 1 TABLET(40 MG) BY MOUTH DAILY 90 tablet 3  . Peppermint Oil (IBGARD PO) Take by mouth.    . polyethylene glycol powder (GLYCOLAX/MIRALAX) powder Take 17 g by mouth 2 (two) times daily as needed. 500 g 3  . Probiotic Product (ULTRAFLORA IMMUNE HEALTH) 170 MG CAPS TAKE (1) CAPSULE DAILY. 60 capsule PRN  . traMADol (ULTRAM) 50 MG tablet TAKE 1-2 TABLETS BY MOUTH 2 TIMES DAILY AS NEEDED FOR PAIN 180 tablet 1   No facility-administered medications prior to visit.    ROS: Review of Systems  Constitutional: Negative for activity change, appetite change, chills, fatigue and unexpected weight change.  HENT: Negative for congestion, mouth sores and sinus pressure.   Eyes: Negative for visual disturbance.  Respiratory: Negative for cough and chest tightness.   Cardiovascular: Negative for chest pain.  Gastrointestinal: Negative  for abdominal pain and nausea.  Genitourinary: Negative for difficulty urinating, frequency and vaginal pain.  Musculoskeletal: Negative for back pain and gait problem.  Skin: Negative for pallor and rash.  Neurological: Negative for dizziness, tremors, weakness, numbness and headaches.  Psychiatric/Behavioral: Negative for confusion, sleep disturbance and suicidal ideas.    Objective:  BP (!) 168/92 (BP Location: Left Arm)   Pulse 77   Temp 98.2 F (36.8 C)   Ht 5\' 7"  (1.702 m)   Wt 171 lb (77.6 kg)   SpO2 98%   BMI 26.78 kg/m   BP  Readings from Last 3 Encounters:  03/17/21 (!) 168/92  03/08/21 138/80  09/08/20 (!) 142/86    Wt Readings from Last 3 Encounters:  03/17/21 171 lb (77.6 kg)  03/08/21 170 lb 3.2 oz (77.2 kg)  09/08/20 170 lb (77.1 kg)    Physical Exam Constitutional:      General: She is not in acute distress.    Appearance: She is well-developed.  HENT:     Head: Normocephalic.     Right Ear: External ear normal.     Left Ear: External ear normal.     Nose: Nose normal.  Eyes:     General:        Right eye: No discharge.        Left eye: No discharge.     Conjunctiva/sclera: Conjunctivae normal.     Pupils: Pupils are equal, round, and reactive to light.  Neck:     Thyroid: No thyromegaly.     Vascular: No JVD.     Trachea: No tracheal deviation.  Cardiovascular:     Rate and Rhythm: Normal rate and regular rhythm.     Heart sounds: Normal heart sounds.  Pulmonary:     Effort: No respiratory distress.     Breath sounds: No stridor. No wheezing.  Abdominal:     General: Bowel sounds are normal. There is no distension.     Palpations: Abdomen is soft. There is no mass.     Tenderness: There is no abdominal tenderness. There is no guarding or rebound.  Musculoskeletal:        General: No tenderness.     Cervical back: Normal range of motion and neck supple.  Lymphadenopathy:     Cervical: No cervical adenopathy.  Skin:    Findings: No erythema or rash.  Neurological:     Cranial Nerves: No cranial nerve deficit.     Motor: No abnormal muscle tone.     Coordination: Coordination normal.     Deep Tendon Reflexes: Reflexes normal.  Psychiatric:        Behavior: Behavior normal.        Thought Content: Thought content normal.        Judgment: Judgment normal.      Procedure: EKG Indication: Preop exam Impression: NSR. No acute changes.  Lab Results  Component Value Date   WBC 5.0 03/08/2021   HGB 12.2 03/08/2021   HCT 36.4 03/08/2021   PLT 247.0 03/08/2021   GLUCOSE  76 03/08/2021   CHOL 212 (H) 09/08/2020   TRIG 92.0 09/08/2020   HDL 66.30 09/08/2020   LDLDIRECT 152.9 10/14/2012   LDLCALC 127 (H) 09/08/2020   ALT 10 03/08/2021   AST 16 03/08/2021   NA 141 03/08/2021   K 3.8 03/08/2021   CL 104 03/08/2021   CREATININE 0.69 03/08/2021   BUN 17 03/08/2021   CO2 29 03/08/2021   TSH 2.60 09/08/2020  INR 0.98 09/09/2016    CT CARDIAC SCORING  Addendum Date: 12/26/2018   ADDENDUM REPORT: 12/26/2018 17:20 CLINICAL DATA:  Risk stratification EXAM: Coronary Calcium Score TECHNIQUE: The patient was scanned on a Siemens Somatom 64 slice scanner. Axial non-contrast 3 mm slices were carried out through the heart. The data set was analyzed on a dedicated work station and scored using the Agatson method. FINDINGS: Non-cardiac: See separate report from [facility]. Ascending aorta: Normal diameter 3.5 cm Pericardium: Normal Coronary arteries: One small punctate area of calcium in mid LAD IMPRESSION: Coronary calcium score of 1. This was 94 th percentile for age and sex matched control. Charlton Haws Electronically Signed   By: Charlton Haws M.D.   On: 12/26/2018 17:20   Result Date: 12/26/2018 EXAM: OVER-READ INTERPRETATION  CT CHEST The following report is an over-read performed by radiologist Dr. Trudie Reed of [facility] on 12/26/2018. This over-read does not include interpretation of cardiac or coronary anatomy or pathology. The coronary calcium score interpretation by the cardiologist is attached. COMPARISON:  None. FINDINGS: Within the visualized portions of the thorax there are no suspicious appearing pulmonary nodules or masses, there is no acute consolidative airspace disease, no pleural effusions, no pneumothorax and no lymphadenopathy. Visualized portions of the upper abdomen demonstrate a and 8 mm low-attenuation lesion in segment 8 of the liver, incompletely characterized on today's noncontrast CT examination, but stable compared to  prior CT the abdomen and pelvis 07/31/2018, statistically likely to represent a cyst. There are no aggressive appearing lytic or blastic lesions noted in the visualized portions of the skeleton. IMPRESSION: 1. No significant incidental noncardiac findings are noted. Electronically Signed: By: Trudie Reed M.D. On: 12/26/2018 14:08    Assessment & Plan:   Shelley B[facility] was seen today for medical clearance and gastroesophageal reflux.  Diagnoses and all orders for this visit:  Preoperative clearance -     EKG 12-Lead   Procedure: EKG Indication: pre-op Impression: NSR. No abnormal changes.   No orders of the defined types were placed in this encounter.    Follow-up: No follow-ups on file.  Sonda Primes, MD

## 2021-03-17 NOTE — Assessment & Plan Note (Signed)
Potential benefits of a long term amphetamines use as well as potential risks  and complications were explained to the patient and were aknowledged.  try Lion's Mane Mushroom extract or capsules for memory

## 2021-03-17 NOTE — Assessment & Plan Note (Addendum)
The patient is medically clear for surgery.  Her EKG is normal.  INR/PTT were ordered. Thank you,

## 2021-03-20 NOTE — Assessment & Plan Note (Signed)
Continue with Protonix.  Use Pepcid as needed

## 2021-03-20 NOTE — Assessment & Plan Note (Signed)
Xanax prn  Potential benefits of a long term benzodiazepines  use as well as potential risks  and complications were explained to the patient and were aknowledged. 

## 2021-03-20 NOTE — Assessment & Plan Note (Signed)
PTSD related.  Continue with Razadyne.

## 2021-03-20 NOTE — Assessment & Plan Note (Signed)
As coronary calcium score was 1 in 2020.  She is statin intolerant.

## 2021-04-15 ENCOUNTER — Telehealth: Payer: Self-pay | Admitting: Internal Medicine

## 2021-04-15 NOTE — Telephone Encounter (Signed)
Please take an extra HCTZ capsule today and start taking HCTZ daily.  Thanks

## 2021-04-15 NOTE — Telephone Encounter (Signed)
[facility] FYI:  --The caller states that her BP is 196/101. She states that she had lost 30 lbs and she had cut her hydrocholorothiazide three times a week. Now her head is pounding. Had brow lift three weeks ago and everything is healing.  See PCP within 24 hours.  *Tried to contact the pt to make an appt with another office, unable to leave voicemail. Please Advise*

## 2021-04-15 NOTE — Telephone Encounter (Signed)
   Patient calling to report elevated BP 196/101  Call transferred to [facility]

## 2021-04-19 NOTE — Telephone Encounter (Signed)
Unable to reach patient.

## 2021-04-20 ENCOUNTER — Encounter: Payer: Self-pay | Admitting: Internal Medicine

## 2021-04-20 ENCOUNTER — Ambulatory Visit (INDEPENDENT_AMBULATORY_CARE_PROVIDER_SITE_OTHER): Payer: Medicare Other | Admitting: Internal Medicine

## 2021-04-20 ENCOUNTER — Other Ambulatory Visit: Payer: Self-pay

## 2021-04-20 DIAGNOSIS — R748 Abnormal levels of other serum enzymes: Secondary | ICD-10-CM | POA: Diagnosis not present

## 2021-04-20 DIAGNOSIS — R5382 Chronic fatigue, unspecified: Secondary | ICD-10-CM

## 2021-04-20 DIAGNOSIS — I10 Essential (primary) hypertension: Secondary | ICD-10-CM | POA: Diagnosis not present

## 2021-04-20 LAB — COMPREHENSIVE METABOLIC PANEL
ALT: 12 U/L (ref 0–35)
AST: 15 U/L (ref 0–37)
Albumin: 4.1 g/dL (ref 3.5–5.2)
Alkaline Phosphatase: 114 U/L (ref 39–117)
BUN: 19 mg/dL (ref 6–23)
CO2: 29 mEq/L (ref 19–32)
Calcium: 9.6 mg/dL (ref 8.4–10.5)
Chloride: 105 mEq/L (ref 96–112)
Creatinine, Ser: 0.7 mg/dL (ref 0.40–1.20)
GFR: 83.84 mL/min (ref >60.00)
Glucose, Bld: 81 mg/dL (ref 70–99)
Potassium: 4 mEq/L (ref 3.5–5.1)
Sodium: 141 mEq/L (ref 135–145)
Total Bilirubin: 0.4 mg/dL (ref 0.2–1.2)
Total Protein: 7 g/dL (ref 6.0–8.3)

## 2021-04-20 LAB — T4, FREE: Free T4: 0.9 ng/dL (ref 0.60–1.60)

## 2021-04-20 LAB — TSH: TSH: 1.73 u[IU]/mL (ref 0.35–4.50)

## 2021-04-20 MED ORDER — OLMESARTAN MEDOXOMIL 20 MG PO TABS: 20.0000 mg | ORAL_TABLET | Freq: Every day | ORAL | 3 refills | Status: DC

## 2021-04-20 NOTE — Progress Notes (Signed)
Subjective:  Patient ID: Shelley Bray, female    DOB: Apr 05, 1946  Age: 75 y.o. MRN: [id number]  CC: Hypertension (F/U on BP pt states BP has been running high past couple of weeks)   HPI Shelley Bray presents for HTN, fatigue x weeks (good days and bad days, focusing issues  Outpatient Medications Prior to Visit  Medication Sig Dispense Refill   ALPRAZolam (XANAX) 0.5 MG tablet TAKE 1 TABLET(0.5 MG) BY MOUTH AT BEDTIME AS NEEDED FOR ANXIETY OR SLEEP 90 tablet 1   Cholecalciferol (VITAMIN D3) 2000 UNITS capsule Take 1 capsule (2,000 Units total) by mouth daily. (Patient taking differently: Take 2,000 Units by mouth 3 (three) times a week.) 100 capsule 3   galantamine (RAZADYNE) 12 MG tablet Take 1 tablet (12 mg total) by mouth 2 (two) times daily. Overdue for Annual appt must see provider for future refills 180 tablet 3   hydrochlorothiazide (MICROZIDE) 12.5 MG capsule TAKE ONE CAPSULE BY MOUTH EVERY DAY(OFFICE VISIT NEEDED FOR FURTHER REFILLS) 90 capsule 3   ibuprofen (ADVIL,MOTRIN) 600 MG tablet Take 1 tablet (600 mg total) by mouth every 8 (eight) hours as needed for headache. 60 tablet 1   Lysine 1000 MG TABS Take 1 tablet (1,000 mg total) by mouth daily. 90 tablet 3   Multiple Vitamin (MULTI-VITAMIN PO) Take 1 tablet by mouth every morning.      Peppermint Oil (IBGARD PO) Take by mouth.     polyethylene glycol powder (GLYCOLAX/MIRALAX) powder Take 17 g by mouth 2 (two) times daily as needed. 500 g 3   Probiotic Product (ULTRAFLORA IMMUNE HEALTH) 170 MG CAPS TAKE (1) CAPSULE DAILY. 60 capsule PRN   traMADol (ULTRAM) 50 MG tablet TAKE 1-2 TABLETS BY MOUTH 2 TIMES DAILY AS NEEDED FOR PAIN 180 tablet 1   pantoprazole (PROTONIX) 40 MG tablet TAKE 1 TABLET(40 MG) BY MOUTH DAILY (Patient not taking: Reported on 04/20/2021) 90 tablet 3   No facility-administered medications prior to visit.    ROS: Review of Systems  Constitutional:  Positive for fatigue. Negative for activity change,  appetite change, chills and unexpected weight change.  HENT:  Negative for congestion, mouth sores and sinus pressure.   Eyes:  Negative for visual disturbance.  Respiratory:  Negative for cough and chest tightness.   Gastrointestinal:  Negative for abdominal pain and nausea.  Genitourinary:  Negative for difficulty urinating, frequency and vaginal pain.  Musculoskeletal:  Negative for back pain and gait problem.  Skin:  Negative for pallor and rash.  Neurological:  Negative for dizziness, tremors, weakness, numbness and headaches.  Psychiatric/Behavioral:  Negative for confusion and sleep disturbance.    Objective:  BP (!) 148/72 (BP Location: Left Arm)   Pulse 70   Temp 98.3 F (36.8 C) (Oral)   Ht 5\' 7"  (1.702 m)   Wt 173 lb 9.6 oz (78.7 kg)   SpO2 98%   BMI 27.19 kg/m   BP Readings from Last 3 Encounters:  04/20/21 (!) 148/72  03/17/21 (!) 168/92  03/08/21 138/80    Wt Readings from Last 3 Encounters:  04/20/21 173 lb 9.6 oz (78.7 kg)  03/17/21 171 lb (77.6 kg)  03/08/21 170 lb 3.2 oz (77.2 kg)    Physical Exam Constitutional:      General: She is not in acute distress.    Appearance: She is well-developed.  HENT:     Head: Normocephalic.     Right Ear: External ear normal.     Left Ear: External  ear normal.     Nose: Nose normal.  Eyes:     General:        Right eye: No discharge.        Left eye: No discharge.     Conjunctiva/sclera: Conjunctivae normal.     Pupils: Pupils are equal, round, and reactive to light.  Neck:     Thyroid: No thyromegaly.     Vascular: No JVD.     Trachea: No tracheal deviation.  Cardiovascular:     Rate and Rhythm: Normal rate and regular rhythm.     Heart sounds: Normal heart sounds.  Pulmonary:     Effort: No respiratory distress.     Breath sounds: No stridor. No wheezing.  Abdominal:     General: Bowel sounds are normal. There is no distension.     Palpations: Abdomen is soft. There is no mass.     Tenderness: There  is no abdominal tenderness. There is no guarding or rebound.  Musculoskeletal:        General: No tenderness.     Cervical back: Normal range of motion and neck supple.  Lymphadenopathy:     Cervical: No cervical adenopathy.  Skin:    Findings: No erythema or rash.  Neurological:     Cranial Nerves: No cranial nerve deficit.     Motor: No abnormal muscle tone.     Coordination: Coordination normal.     Deep Tendon Reflexes: Reflexes normal.  Psychiatric:        Behavior: Behavior normal.        Thought Content: Thought content normal.        Judgment: Judgment normal.    Lab Results  Component Value Date   WBC 5.0 03/08/2021   HGB 12.2 03/08/2021   HCT 36.4 03/08/2021   PLT 247.0 03/08/2021   GLUCOSE 76 03/08/2021   CHOL 212 (H) 09/08/2020   TRIG 92.0 09/08/2020   HDL 66.30 09/08/2020   LDLDIRECT 152.9 10/14/2012   LDLCALC 127 (H) 09/08/2020   ALT 10 03/08/2021   AST 16 03/08/2021   NA 141 03/08/2021   K 3.8 03/08/2021   CL 104 03/08/2021   CREATININE 0.69 03/08/2021   BUN 17 03/08/2021   CO2 29 03/08/2021   TSH 2.60 09/08/2020   INR 1.1 (H) 03/17/2021    CT CARDIAC SCORING  Addendum Date: 12/26/2018   ADDENDUM REPORT: 12/26/2018 17:20 CLINICAL DATA:  Risk stratification EXAM: Coronary Calcium Score TECHNIQUE: The patient was scanned on a Siemens Somatom 64 slice scanner. Axial non-contrast 3 mm slices were carried out through the heart. The data set was analyzed on a dedicated work station and scored using the Agatson method. FINDINGS: Non-cardiac: See separate report from [facility]. Ascending aorta: Normal diameter 3.5 cm Pericardium: Normal Coronary arteries: One small punctate area of calcium in mid LAD IMPRESSION: Coronary calcium score of 1. This was 62 th percentile for age and sex matched control. Charlton Haws Electronically Signed   By: Charlton Haws M.D.   On: 12/26/2018 17:20   Result Date: 12/26/2018 EXAM: OVER-READ INTERPRETATION  CT CHEST The  following report is an over-read performed by radiologist Dr. Trudie Reed of [facility] on 12/26/2018. This over-read does not include interpretation of cardiac or coronary anatomy or pathology. The coronary calcium score interpretation by the cardiologist is attached. COMPARISON:  None. FINDINGS: Within the visualized portions of the thorax there are no suspicious appearing pulmonary nodules or masses, there is no acute consolidative airspace  disease, no pleural effusions, no pneumothorax and no lymphadenopathy. Visualized portions of the upper abdomen demonstrate a and 8 mm low-attenuation lesion in segment 8 of the liver, incompletely characterized on today's noncontrast CT examination, but stable compared to prior CT the abdomen and pelvis 07/31/2018, statistically likely to represent a cyst. There are no aggressive appearing lytic or blastic lesions noted in the visualized portions of the skeleton. IMPRESSION: 1. No significant incidental noncardiac findings are noted. Electronically Signed: By: Trudie Reed M.D. On: 12/26/2018 14:08    Assessment & Plan:     Sonda Primes, MD

## 2021-04-20 NOTE — Addendum Note (Signed)
Addended by: Waldemar Dickens B on: 04/20/2021 02:25 PM   Modules accepted: Orders

## 2021-04-20 NOTE — Assessment & Plan Note (Signed)
Will add Olmesartan  RTC 6 wks

## 2021-04-20 NOTE — Assessment & Plan Note (Signed)
Worse Treat HTN Check CMET, CBC

## 2021-04-20 NOTE — Assessment & Plan Note (Signed)
Mild Re-check labs US liver

## 2021-04-22 NOTE — Telephone Encounter (Signed)
Patient has been notified and is taking the HCTZ

## 2021-05-17 ENCOUNTER — Ambulatory Visit
Admission: RE | Admit: 2021-05-17 | Discharge: 2021-05-17 | Disposition: A | Discharge status: Home / Self Care | Payer: Medicare Other | Source: Ambulatory Visit | Attending: Internal Medicine | Admitting: Internal Medicine

## 2021-05-17 DIAGNOSIS — R5382 Chronic fatigue, unspecified: Secondary | ICD-10-CM

## 2021-05-17 DIAGNOSIS — R748 Abnormal levels of other serum enzymes: Secondary | ICD-10-CM

## 2021-06-29 ENCOUNTER — Other Ambulatory Visit: Payer: Self-pay | Admitting: Internal Medicine

## 2021-08-08 ENCOUNTER — Other Ambulatory Visit: Payer: Self-pay | Admitting: Internal Medicine

## 2021-09-22 ENCOUNTER — Encounter: Payer: Self-pay | Admitting: Internal Medicine

## 2021-09-27 ENCOUNTER — Other Ambulatory Visit: Payer: Self-pay | Admitting: Internal Medicine

## 2021-10-12 ENCOUNTER — Other Ambulatory Visit: Payer: Self-pay | Admitting: Internal Medicine

## 2021-10-18 ENCOUNTER — Ambulatory Visit (INDEPENDENT_AMBULATORY_CARE_PROVIDER_SITE_OTHER): Payer: Medicare Other | Admitting: Internal Medicine

## 2021-10-18 ENCOUNTER — Encounter: Payer: Self-pay | Admitting: Internal Medicine

## 2021-10-18 ENCOUNTER — Other Ambulatory Visit: Payer: Self-pay

## 2021-10-18 DIAGNOSIS — K219 Gastro-esophageal reflux disease without esophagitis: Secondary | ICD-10-CM | POA: Diagnosis not present

## 2021-10-18 DIAGNOSIS — I10 Essential (primary) hypertension: Secondary | ICD-10-CM

## 2021-10-18 DIAGNOSIS — F419 Anxiety disorder, unspecified: Secondary | ICD-10-CM | POA: Diagnosis not present

## 2021-10-18 MED ORDER — AMLODIPINE BESYLATE 2.5 MG PO TABS: 2.5000 mg | ORAL_TABLET | Freq: Every day | ORAL | 3 refills | Status: DC

## 2021-10-18 MED ORDER — PANTOPRAZOLE SODIUM 40 MG PO TBEC: 40.0000 mg | DELAYED_RELEASE_TABLET | Freq: Every day | ORAL | 3 refills | Status: DC

## 2021-10-18 NOTE — Progress Notes (Signed)
Subjective:  Patient ID: Shelley Bray, female    DOB: 29-Jan-1946  Age: 75 y.o. MRN: [id number]  CC: Hypertension (Pt states she stop taking the Olmesartan x's 3 weeks. She states she was nauseated, light headedness)    HPI Shelley Bray presents for HTN - off Olmesartan due to side effects C/o GERD - Protonix helped (pt stopped taking it), gastric soothe Follow-up 1 depression/anxiety  Outpatient Medications Prior to Visit  Medication Sig Dispense Refill   ALPRAZolam (XANAX) 0.5 MG tablet TAKE 1 TABLET(0.5 MG) BY MOUTH AT BEDTIME AS NEEDED FOR ANXIETY OR SLEEP 90 tablet 3   Cholecalciferol (VITAMIN D3) 2000 UNITS capsule Take 1 capsule (2,000 Units total) by mouth daily. (Patient taking differently: Take 2,000 Units by mouth 3 (three) times a week.) 100 capsule 3   galantamine (RAZADYNE) 12 MG tablet TAKE 1 TABLET BY MOUTH 2 TIMES DAILY 180 tablet 3   hydrochlorothiazide (MICROZIDE) 12.5 MG capsule TAKE ONE CAPSULE BY MOUTH EVERY DAY 90 capsule 3   ibuprofen (ADVIL,MOTRIN) 600 MG tablet Take 1 tablet (600 mg total) by mouth every 8 (eight) hours as needed for headache. 60 tablet 1   Lysine 1000 MG TABS Take 1 tablet (1,000 mg total) by mouth daily. 90 tablet 3   Multiple Vitamin (MULTI-VITAMIN PO) Take 1 tablet by mouth every morning.      Peppermint Oil (IBGARD PO) Take by mouth.     polyethylene glycol powder (GLYCOLAX/MIRALAX) powder Take 17 g by mouth 2 (two) times daily as needed. 500 g 3   Probiotic Product ([place]) 170 MG CAPS TAKE (1) CAPSULE DAILY. 60 capsule PRN   traMADol (ULTRAM) 50 MG tablet TAKE 1-2 TABLETS BY MOUTH 2 TIMES DAILY AS NEEDED FOR PAIN 180 tablet 1   olmesartan (BENICAR) 20 MG tablet Take 1 tablet (20 mg total) by mouth daily. (Patient not taking: Reported on 10/18/2021) 90 tablet 3   No facility-administered medications prior to visit.    ROS: Review of Systems  Constitutional:  Negative for activity change, appetite change, chills,  fatigue and unexpected weight change.  HENT:  Negative for congestion, mouth sores and sinus pressure.   Eyes:  Negative for visual disturbance.  Respiratory:  Negative for cough and chest tightness.   Gastrointestinal:  Negative for abdominal pain and nausea.  Genitourinary:  Negative for difficulty urinating, frequency and vaginal pain.  Musculoskeletal:  Negative for back pain and gait problem.  Skin:  Negative for pallor and rash.  Neurological:  Negative for dizziness, tremors, weakness, numbness and headaches.  Psychiatric/Behavioral:  Negative for confusion, sleep disturbance and suicidal ideas. The patient is nervous/anxious.    Objective:  BP (!) 142/72 (BP Location: Left Arm)    Pulse 71    Temp 98.1 F (36.7 C) (Oral)    Ht 5\' 7"  (1.702 m)    Wt 171 lb 12.8 oz (77.9 kg)    SpO2 97%    BMI 26.91 kg/m   BP Readings from Last 3 Encounters:  10/18/21 (!) 142/72  04/20/21 (!) 148/72  03/17/21 (!) 168/92    Wt Readings from Last 3 Encounters:  10/18/21 171 lb 12.8 oz (77.9 kg)  04/20/21 173 lb 9.6 oz (78.7 kg)  03/17/21 171 lb (77.6 kg)    Physical Exam Constitutional:      General: She is not in acute distress.    Appearance: She is well-developed.  HENT:     Head: Normocephalic.     Right Ear: External  ear normal.     Left Ear: External ear normal.     Nose: Nose normal.  Eyes:     General:        Right eye: No discharge.        Left eye: No discharge.     Conjunctiva/sclera: Conjunctivae normal.     Pupils: Pupils are equal, round, and reactive to light.  Neck:     Thyroid: No thyromegaly.     Vascular: No JVD.     Trachea: No tracheal deviation.  Cardiovascular:     Rate and Rhythm: Normal rate and regular rhythm.     Heart sounds: Normal heart sounds.  Pulmonary:     Effort: No respiratory distress.     Breath sounds: No stridor. No wheezing.  Abdominal:     General: Bowel sounds are normal. There is no distension.     Palpations: Abdomen is soft.  There is no mass.     Tenderness: There is no abdominal tenderness. There is no guarding or rebound.  Musculoskeletal:        General: No tenderness.     Cervical back: Normal range of motion and neck supple. No rigidity.  Lymphadenopathy:     Cervical: No cervical adenopathy.  Skin:    Findings: No erythema or rash.  Neurological:     Cranial Nerves: No cranial nerve deficit.     Motor: No abnormal muscle tone.     Coordination: Coordination normal.     Deep Tendon Reflexes: Reflexes normal.  Psychiatric:        Behavior: Behavior normal.        Thought Content: Thought content normal.        Judgment: Judgment normal.    Lab Results  Component Value Date   WBC 5.0 03/08/2021   HGB 12.2 03/08/2021   HCT 36.4 03/08/2021   PLT 247.0 03/08/2021   GLUCOSE 81 04/20/2021   CHOL 212 (H) 09/08/2020   TRIG 92.0 09/08/2020   HDL 66.30 09/08/2020   LDLDIRECT 152.9 10/14/2012   LDLCALC 127 (H) 09/08/2020   ALT 12 04/20/2021   AST 15 04/20/2021   NA 141 04/20/2021   K 4.0 04/20/2021   CL 105 04/20/2021   CREATININE 0.70 04/20/2021   BUN 19 04/20/2021   CO2 29 04/20/2021   TSH 1.73 04/20/2021   INR 1.1 (H) 03/17/2021    US Abdomen Limited RUQ (LIVER/GB)  Result Date: 05/19/2021 CLINICAL DATA:  Elevated alkaline phosphatase.  Chronic fatigue. EXAM: ULTRASOUND ABDOMEN LIMITED RIGHT UPPER QUADRANT COMPARISON:  CT abdomen 07/31/2018 FINDINGS: Gallbladder: No gallstones or wall thickening visualized. No sonographic Murphy sign noted by sonographer. Common bile duct: Diameter: 0.5 cm, within normal limits Liver: Small hepatic cysts as shown on CT scan of 07/31/2018. Echogenicity in the upper normal range. Portal vein is patent on color Doppler imaging with normal direction of blood flow towards the liver. Other: None. IMPRESSION: 1. Small hepatic cysts. 2. No biliary dilatation or gallbladder abnormality observed. 3. Hepatic echogenicity is in the upper normal range but not overtly  accentuated. Electronically Signed   By: Gaylyn Rong M.D.   On: 05/19/2021 10:42    Assessment & Plan:   Problem List Items Addressed This Visit     Anxiety disorder    Chronic  Xanax prn  Potential benefits of a long term benzodiazepines  use as well as potential risks  and complications were explained to the patient and were aknowledged.  Essential hypertension     Olmesartan 20 - d/c due to side effects 12/22.  Continue with HCTZ and Norvasc      Relevant Medications   amLODipine (NORVASC) 2.5 MG tablet   GERD (gastroesophageal reflux disease)    Worse Re-start Protonix      Relevant Medications   pantoprazole (PROTONIX) 40 MG tablet      Meds ordered this encounter  Medications   pantoprazole (PROTONIX) 40 MG tablet    Sig: Take 1 tablet (40 mg total) by mouth daily.    Dispense:  90 tablet    Refill:  3   amLODipine (NORVASC) 2.5 MG tablet    Sig: Take 1 tablet (2.5 mg total) by mouth daily.    Dispense:  90 tablet    Refill:  3      Follow-up: Return in about 6 weeks (around 11/29/2021) for a follow-up visit.  Walker Kehr, MD

## 2021-10-18 NOTE — Assessment & Plan Note (Signed)
Worse Re-start Protonix 

## 2021-10-24 NOTE — Assessment & Plan Note (Signed)
Chronic  Xanax prn  Potential benefits of a long term benzodiazepines  use as well as potential risks  and complications were explained to the patient and were aknowledged. 

## 2021-10-24 NOTE — Assessment & Plan Note (Signed)
Olmesartan 20 - d/c due to side effects 12/22.  Continue with HCTZ and Norvasc

## 2021-11-08 ENCOUNTER — Other Ambulatory Visit: Payer: Self-pay

## 2021-11-08 ENCOUNTER — Encounter: Payer: Self-pay | Admitting: Internal Medicine

## 2021-11-08 ENCOUNTER — Ambulatory Visit (INDEPENDENT_AMBULATORY_CARE_PROVIDER_SITE_OTHER): Payer: Medicare Other | Admitting: Internal Medicine

## 2021-11-08 DIAGNOSIS — I10 Essential (primary) hypertension: Secondary | ICD-10-CM

## 2021-11-08 DIAGNOSIS — R5382 Chronic fatigue, unspecified: Secondary | ICD-10-CM | POA: Diagnosis not present

## 2021-11-08 DIAGNOSIS — F419 Anxiety disorder, unspecified: Secondary | ICD-10-CM

## 2021-11-08 DIAGNOSIS — F988 Other specified behavioral and emotional disorders with onset usually occurring in childhood and adolescence: Secondary | ICD-10-CM

## 2021-11-08 MED ORDER — TRAMADOL HCL 50 MG PO TABS: ORAL_TABLET | ORAL | 1 refills | Status: DC

## 2021-11-08 NOTE — Assessment & Plan Note (Signed)
Xanax prn  Potential benefits of a long term benzodiazepines  use as well as potential risks  and complications were explained to the patient and were aknowledged. 

## 2021-11-08 NOTE — Progress Notes (Signed)
Subjective:  Patient ID: Shelley Bray, female    DOB: 03-Apr-1946  Age: 76 y.o. MRN: [id number]  CC: Hypertension   HPI Shelley Bray presents for HTN, GERD, stress - better  Outpatient Medications Prior to Visit  Medication Sig Dispense Refill   ALPRAZolam (XANAX) 0.5 MG tablet TAKE 1 TABLET(0.5 MG) BY MOUTH AT BEDTIME AS NEEDED FOR ANXIETY OR SLEEP 90 tablet 3   amLODipine (NORVASC) 2.5 MG tablet Take 1 tablet (2.5 mg total) by mouth daily. 90 tablet 3   Cholecalciferol (VITAMIN D3) 2000 UNITS capsule Take 1 capsule (2,000 Units total) by mouth daily. (Patient taking differently: Take 2,000 Units by mouth 3 (three) times a week.) 100 capsule 3   galantamine (RAZADYNE) 12 MG tablet TAKE 1 TABLET BY MOUTH 2 TIMES DAILY 180 tablet 3   hydrochlorothiazide (MICROZIDE) 12.5 MG capsule TAKE ONE CAPSULE BY MOUTH EVERY DAY 90 capsule 3   ibuprofen (ADVIL,MOTRIN) 600 MG tablet Take 1 tablet (600 mg total) by mouth every 8 (eight) hours as needed for headache. 60 tablet 1   Lysine 1000 MG TABS Take 1 tablet (1,000 mg total) by mouth daily. 90 tablet 3   Multiple Vitamin (MULTI-VITAMIN PO) Take 1 tablet by mouth every morning.      pantoprazole (PROTONIX) 40 MG tablet Take 1 tablet (40 mg total) by mouth daily. 90 tablet 3   Peppermint Oil (IBGARD PO) Take by mouth.     polyethylene glycol powder (GLYCOLAX/MIRALAX) powder Take 17 g by mouth 2 (two) times daily as needed. 500 g 3   Probiotic Product (ULTRAFLORA IMMUNE HEALTH) 170 MG CAPS TAKE (1) CAPSULE DAILY. 60 capsule PRN   traMADol (ULTRAM) 50 MG tablet TAKE 1-2 TABLETS BY MOUTH 2 TIMES DAILY AS NEEDED FOR PAIN 180 tablet 1   No facility-administered medications prior to visit.    ROS: Review of Systems  Constitutional:  Negative for activity change, appetite change, chills, fatigue and unexpected weight change.  HENT:  Negative for congestion, mouth sores and sinus pressure.   Eyes:  Negative for visual disturbance.  Respiratory:   Negative for cough and chest tightness.   Gastrointestinal:  Negative for abdominal pain and nausea.  Genitourinary:  Negative for difficulty urinating, frequency and vaginal pain.  Musculoskeletal:  Negative for back pain and gait problem.  Skin:  Negative for pallor and rash.  Neurological:  Negative for dizziness, tremors, weakness, numbness and headaches.  Psychiatric/Behavioral:  Negative for confusion, sleep disturbance and suicidal ideas. The patient is nervous/anxious.    Objective:  BP 134/68 (BP Location: Left Arm)    Pulse 80    Temp 98.6 F (37 C) (Oral)    SpO2 98%   BP Readings from Last 3 Encounters:  11/08/21 134/68  10/18/21 (!) 142/72  04/20/21 (!) 148/72    Wt Readings from Last 3 Encounters:  10/18/21 171 lb 12.8 oz (77.9 kg)  04/20/21 173 lb 9.6 oz (78.7 kg)  03/17/21 171 lb (77.6 kg)    Physical Exam Constitutional:      General: She is not in acute distress.    Appearance: She is well-developed.  HENT:     Head: Normocephalic.     Right Ear: External ear normal.     Left Ear: External ear normal.     Nose: Nose normal.  Eyes:     General:        Right eye: No discharge.        Left eye: No discharge.  Conjunctiva/sclera: Conjunctivae normal.     Pupils: Pupils are equal, round, and reactive to light.  Neck:     Thyroid: No thyromegaly.     Vascular: No JVD.     Trachea: No tracheal deviation.  Cardiovascular:     Rate and Rhythm: Normal rate and regular rhythm.     Heart sounds: Normal heart sounds.  Pulmonary:     Effort: No respiratory distress.     Breath sounds: No stridor. No wheezing.  Abdominal:     General: Bowel sounds are normal. There is no distension.     Palpations: Abdomen is soft. There is no mass.     Tenderness: There is no abdominal tenderness. There is no guarding or rebound.  Musculoskeletal:        General: No tenderness.     Cervical back: Normal range of motion and neck supple. No rigidity.  Lymphadenopathy:      Cervical: No cervical adenopathy.  Skin:    Findings: No erythema or rash.  Neurological:     Cranial Nerves: No cranial nerve deficit.     Motor: No abnormal muscle tone.     Coordination: Coordination normal.     Deep Tendon Reflexes: Reflexes normal.  Psychiatric:        Behavior: Behavior normal.        Thought Content: Thought content normal.        Judgment: Judgment normal.    Lab Results  Component Value Date   WBC 5.0 03/08/2021   HGB 12.2 03/08/2021   HCT 36.4 03/08/2021   PLT 247.0 03/08/2021   GLUCOSE 81 04/20/2021   CHOL 212 (H) 09/08/2020   TRIG 92.0 09/08/2020   HDL 66.30 09/08/2020   LDLDIRECT 152.9 10/14/2012   LDLCALC 127 (H) 09/08/2020   ALT 12 04/20/2021   AST 15 04/20/2021   NA 141 04/20/2021   K 4.0 04/20/2021   CL 105 04/20/2021   CREATININE 0.70 04/20/2021   BUN 19 04/20/2021   CO2 29 04/20/2021   TSH 1.73 04/20/2021   INR 1.1 (H) 03/17/2021    US Abdomen Limited RUQ (LIVER/GB)  Result Date: 05/19/2021 CLINICAL DATA:  Elevated alkaline phosphatase.  Chronic fatigue. EXAM: ULTRASOUND ABDOMEN LIMITED RIGHT UPPER QUADRANT COMPARISON:  CT abdomen 07/31/2018 FINDINGS: Gallbladder: No gallstones or wall thickening visualized. No sonographic Murphy sign noted by sonographer. Common bile duct: Diameter: 0.5 cm, within normal limits Liver: Small hepatic cysts as shown on CT scan of 07/31/2018. Echogenicity in the upper normal range. Portal vein is patent on color Doppler imaging with normal direction of blood flow towards the liver. Other: None. IMPRESSION: 1. Small hepatic cysts. 2. No biliary dilatation or gallbladder abnormality observed. 3. Hepatic echogenicity is in the upper normal range but not overtly accentuated. Electronically Signed   By: Van Clines M.D.   On: 05/19/2021 10:42    Assessment & Plan:   Problem List Items Addressed This Visit     ADD (attention deficit disorder)    Related to cognitive disorder, however she thinks she had  ADD all her life  Potential benefits of a long term amphetamines use as well as potential risks  and complications were explained to the patient and were aknowledged.  try Lion's Mane Mushroom extract or capsules for memory      Anxiety disorder    Xanax prn  Potential benefits of a long term benzodiazepines  use as well as potential risks  and complications were explained to the patient and  were aknowledged.      Chronic fatigue    Better       Essential hypertension    . Continue with HCTZ and Norvasc         Meds ordered this encounter  Medications   traMADol (ULTRAM) 50 MG tablet    Sig: TAKE 1-2 TABLETS BY MOUTH 2 TIMES DAILY AS NEEDED FOR PAIN    Dispense:  180 tablet    Refill:  1      Follow-up: Return in about 6 months (around 05/08/2022) for Wellness Exam.  Walker Kehr, MD

## 2021-11-08 NOTE — Assessment & Plan Note (Signed)
Related to cognitive disorder, however she thinks she had ADD all her life  Potential benefits of a long term amphetamines use as well as potential risks  and complications were explained to the patient and were aknowledged.  try Lion's Mane Mushroom extract or capsules for memory

## 2021-11-08 NOTE — Assessment & Plan Note (Signed)
Better  

## 2021-11-08 NOTE — Patient Instructions (Signed)
For a mild COVID-19 case - take zinc 50 mg a day for 1 week, vitamin C 1000 mg daily for 1 week, vitamin D2 50,000 units weekly for 2 months (unless  taking vitamin D daily already), an antioxidant Quercetin 500 mg twice a day for 1 week (if you can get it quick enough). Take Allegra or Benadryl.  Maintain good oral hydration and take Tylenol for high fever.  Call if problems. Isolate for 5 days, then wear a mask for 5 days per CDC.  

## 2021-11-08 NOTE — Assessment & Plan Note (Signed)
.   Continue with HCTZ and Norvasc

## 2022-01-19 ENCOUNTER — Telehealth: Payer: Self-pay | Admitting: Internal Medicine

## 2022-01-19 NOTE — Telephone Encounter (Signed)
N/A unable to leave a message for patient to call back to schedule Medicare Annual Wellness Visit  ? ?Last AWV  02/28/17 ? ?Please schedule at anytime with [facility] Advisor if patient calls the office back.   ? ?40 Minutes appointment  ? ?Any questions, please call me at [phone]  ?

## 2022-02-13 ENCOUNTER — Ambulatory Visit (INDEPENDENT_AMBULATORY_CARE_PROVIDER_SITE_OTHER): Payer: Medicare Other | Admitting: Internal Medicine

## 2022-02-13 ENCOUNTER — Encounter: Payer: Self-pay | Admitting: Internal Medicine

## 2022-02-13 VITALS — BP 130/78 | HR 76 | Temp 98.2°F | Ht 67.0 in

## 2022-02-13 DIAGNOSIS — R5382 Chronic fatigue, unspecified: Secondary | ICD-10-CM | POA: Diagnosis not present

## 2022-02-13 DIAGNOSIS — I10 Essential (primary) hypertension: Secondary | ICD-10-CM

## 2022-02-13 DIAGNOSIS — R202 Paresthesia of skin: Secondary | ICD-10-CM | POA: Diagnosis not present

## 2022-02-13 DIAGNOSIS — F419 Anxiety disorder, unspecified: Secondary | ICD-10-CM | POA: Diagnosis not present

## 2022-02-13 NOTE — Progress Notes (Signed)
? ?Subjective:  ?Patient ID: Shelley Bray, female    DOB: 10/04/1946  Age: 76 y.o. MRN: [id number] ? ?CC: No chief complaint on file. ? ? ?HPI ?Shelley BeringBrenda W Bray presents for HTN, GERD, anxiety ?Using Herbalife products ? ?Outpatient Medications Prior to Visit  ?Medication Sig Dispense Refill  ? ALPRAZolam (XANAX) 0.5 MG tablet TAKE 1 TABLET(0.5 MG) BY MOUTH AT BEDTIME AS NEEDED FOR ANXIETY OR SLEEP 90 tablet 3  ? amLODipine (NORVASC) 2.5 MG tablet Take 1 tablet (2.5 mg total) by mouth daily. 90 tablet 3  ? Cholecalciferol (VITAMIN D3) 2000 UNITS capsule Take 1 capsule (2,000 Units total) by mouth daily. (Patient taking differently: Take 2,000 Units by mouth 3 (three) times a week.) 100 capsule 3  ? galantamine (RAZADYNE) 12 MG tablet TAKE 1 TABLET BY MOUTH 2 TIMES DAILY 180 tablet 3  ? hydrochlorothiazide (MICROZIDE) 12.5 MG capsule TAKE ONE CAPSULE BY MOUTH EVERY DAY 90 capsule 3  ? ibuprofen (ADVIL,MOTRIN) 600 MG tablet Take 1 tablet (600 mg total) by mouth every 8 (eight) hours as needed for headache. 60 tablet 1  ? Lysine 1000 MG TABS Take 1 tablet (1,000 mg total) by mouth daily. 90 tablet 3  ? Multiple Vitamin (MULTI-VITAMIN PO) Take 1 tablet by mouth every morning.     ? pantoprazole (PROTONIX) 40 MG tablet Take 1 tablet (40 mg total) by mouth daily. 90 tablet 3  ? Peppermint Oil (IBGARD PO) Take by mouth.    ? polyethylene glycol powder (GLYCOLAX/MIRALAX) powder Take 17 g by mouth 2 (two) times daily as needed. 500 g 3  ? Probiotic Product (ULTRAFLORA IMMUNE HEALTH) 170 MG CAPS TAKE (1) CAPSULE DAILY. 60 capsule PRN  ? traMADol (ULTRAM) 50 MG tablet TAKE 1-2 TABLETS BY MOUTH 2 TIMES DAILY AS NEEDED FOR PAIN 180 tablet 1  ? ?No facility-administered medications prior to visit.  ? ? ?ROS: ?Review of Systems  ?Constitutional:  Positive for fatigue. Negative for activity change, appetite change, chills and unexpected weight change.  ?HENT:  Negative for congestion, mouth sores and sinus pressure.   ?Eyes:   Negative for visual disturbance.  ?Respiratory:  Negative for cough and chest tightness.   ?Gastrointestinal:  Negative for abdominal pain and nausea.  ?Genitourinary:  Negative for difficulty urinating, frequency and vaginal pain.  ?Musculoskeletal:  Negative for back pain and gait problem.  ?Skin:  Negative for pallor and rash.  ?Neurological:  Negative for dizziness, tremors, weakness, numbness and headaches.  ?Psychiatric/Behavioral:  Negative for confusion, sleep disturbance and suicidal ideas. The patient is nervous/anxious.   ? ?Objective:  ?BP 130/78 (BP Location: Left Arm, Patient Position: Sitting, Cuff Size: Large)   Pulse 76   Temp 98.2 ?F (36.8 ?C) (Oral)   Ht 5\' 7"  (1.702 m)   SpO2 94%   BMI 26.91 kg/m?  ? ?BP Readings from Last 3 Encounters:  ?02/13/22 130/78  ?11/08/21 134/68  ?10/18/21 (!) 142/72  ? ? ?Wt Readings from Last 3 Encounters:  ?10/18/21 171 lb 12.8 oz (77.9 kg)  ?04/20/21 173 lb 9.6 oz (78.7 kg)  ?03/17/21 171 lb (77.6 kg)  ? ? ?Physical Exam ?Constitutional:   ?   General: She is not in acute distress. ?   Appearance: She is well-developed.  ?HENT:  ?   Head: Normocephalic.  ?   Right Ear: External ear normal.  ?   Left Ear: External ear normal.  ?   Nose: Nose normal.  ?Eyes:  ?   General:     ?  Right eye: No discharge.     ?   Left eye: No discharge.  ?   Conjunctiva/sclera: Conjunctivae normal.  ?   Pupils: Pupils are equal, round, and reactive to light.  ?Neck:  ?   Thyroid: No thyromegaly.  ?   Vascular: No JVD.  ?   Trachea: No tracheal deviation.  ?Cardiovascular:  ?   Rate and Rhythm: Normal rate and regular rhythm.  ?   Heart sounds: Normal heart sounds.  ?Pulmonary:  ?   Effort: No respiratory distress.  ?   Breath sounds: No stridor. No wheezing.  ?Abdominal:  ?   General: Bowel sounds are normal. There is no distension.  ?   Palpations: Abdomen is soft. There is no mass.  ?   Tenderness: There is no abdominal tenderness. There is no guarding or rebound.   ?Musculoskeletal:     ?   General: No tenderness.  ?   Cervical back: Normal range of motion and neck supple. No rigidity.  ?Lymphadenopathy:  ?   Cervical: No cervical adenopathy.  ?Skin: ?   Findings: No erythema or rash.  ?Neurological:  ?   Mental Status: She is oriented to person, place, and time.  ?   Cranial Nerves: No cranial nerve deficit.  ?   Motor: No abnormal muscle tone.  ?   Coordination: Coordination normal.  ?   Deep Tendon Reflexes: Reflexes normal.  ?Psychiatric:     ?   Behavior: Behavior normal.     ?   Thought Content: Thought content normal.     ?   Judgment: Judgment normal.  ? ? ?Lab Results  ?Component Value Date  ? WBC 5.0 03/08/2021  ? HGB 12.2 03/08/2021  ? HCT 36.4 03/08/2021  ? PLT 247.0 03/08/2021  ? GLUCOSE 81 04/20/2021  ? CHOL 212 (H) 09/08/2020  ? TRIG 92.0 09/08/2020  ? HDL 66.30 09/08/2020  ? LDLDIRECT 152.9 10/14/2012  ? LDLCALC 127 (H) 09/08/2020  ? ALT 12 04/20/2021  ? AST 15 04/20/2021  ? NA 141 04/20/2021  ? K 4.0 04/20/2021  ? CL 105 04/20/2021  ? CREATININE 0.70 04/20/2021  ? BUN 19 04/20/2021  ? CO2 29 04/20/2021  ? TSH 1.73 04/20/2021  ? INR 1.1 (H) 03/17/2021  ? ? ?US Abdomen Limited RUQ (LIVER/GB) ? ?Result Date: 05/19/2021 ?CLINICAL DATA:  Elevated alkaline phosphatase.  Chronic fatigue. EXAM: ULTRASOUND ABDOMEN LIMITED RIGHT UPPER QUADRANT COMPARISON:  CT abdomen 07/31/2018 FINDINGS: Gallbladder: No gallstones or wall thickening visualized. No sonographic Murphy sign noted by sonographer. Common bile duct: Diameter: 0.5 cm, within normal limits Liver: Small hepatic cysts as shown on CT scan of 07/31/2018. Echogenicity in the upper normal range. Portal vein is patent on color Doppler imaging with normal direction of blood flow towards the liver. Other: None. IMPRESSION: 1. Small hepatic cysts. 2. No biliary dilatation or gallbladder abnormality observed. 3. Hepatic echogenicity is in the upper normal range but not overtly accentuated. Electronically Signed   By:  Gaylyn Rong M.D.   On: 05/19/2021 10:42  ? ? ?Assessment & Plan:  ? ?Problem List Items Addressed This Visit   ? ? Anxiety disorder  ?  Chronic  ?Xanax prn ? Potential benefits of a long term benzodiazepines  use as well as potential risks  and complications were explained to the patient and were aknowledged. ?  ?  ? Relevant Orders  ? Urinalysis  ? Essential hypertension  ?  BP is nl at home ?HCTZ ?  Try to walk 5000 steps a day ?  ?  ? Chronic fatigue - Primary  ?  Try to walk 5000 steps a day ?  ?  ? Relevant Orders  ? CBC with Differential/Platelet  ? Comprehensive metabolic panel  ? TSH  ? T4, free  ? Vitamin B12  ? Urinalysis  ? ?Other Visit Diagnoses   ? ? Paresthesia      ? Relevant Orders  ? Vitamin B12  ? ?  ?  ? ? ?No orders of the defined types were placed in this encounter. ?  ? ? ?Follow-up: Return in about 6 months (around 08/15/2022) for Wellness Exam. ? ?Sonda Primes, MD ?

## 2022-02-13 NOTE — Patient Instructions (Signed)
Try to walk 5000 steps a day ?

## 2022-02-13 NOTE — Assessment & Plan Note (Signed)
Chronic  Xanax prn  Potential benefits of a long term benzodiazepines  use as well as potential risks  and complications were explained to the patient and were aknowledged. 

## 2022-02-13 NOTE — Assessment & Plan Note (Signed)
BP is nl at home ?HCTZ ?Try to walk 5000 steps a day ?

## 2022-02-13 NOTE — Assessment & Plan Note (Signed)
Try to walk 5000 steps a day ?

## 2022-05-01 ENCOUNTER — Ambulatory Visit (INDEPENDENT_AMBULATORY_CARE_PROVIDER_SITE_OTHER): Payer: Medicare Other

## 2022-05-01 DIAGNOSIS — Z0000 Encounter for general adult medical examination without abnormal findings: Secondary | ICD-10-CM | POA: Diagnosis not present

## 2022-05-01 NOTE — Progress Notes (Addendum)
I connected with Shelley Bray today by telephone and verified that I am speaking with the correct person using two identifiers. Location patient: home Location provider: work Persons participating in the virtual visit: patient, provider.   I discussed the limitations, risks, security and privacy concerns of performing an evaluation and management service by telephone and the availability of in person appointments. I also discussed with the patient that there may be a patient responsible charge related to this service. The patient expressed understanding and verbally consented to this telephonic visit.    Interactive audio and video telecommunications were attempted between this provider and patient, however failed, due to patient having technical difficulties OR patient did not have access to video capability.  We continued and completed visit with audio only.  Some vital signs may be absent or patient reported.   Time Spent with patient on telephone encounter: 30 minutes  Subjective:   Shelley Bray is a 76 y.o. female who presents for Medicare Annual (Subsequent) preventive examination.  Review of Systems     Cardiac Risk Factors include: advanced age (>8men, >57 women);hypertension;family history of premature cardiovascular disease     Objective:    There were no vitals filed for this visit. There is no height or weight on file to calculate BMI.     05/01/2022    1:20 PM 02/28/2017   10:23 AM 09/09/2016   12:46 AM 02/28/2016    1:57 PM  Advanced Directives  Does Patient Have a Medical Advance Directive? Yes No No Yes  Type of Advance Directive Living will;Healthcare Power of Attorney     Does patient want to make changes to medical advance directive? No - Patient declined     Copy of Healthcare Power of Attorney in Chart? No - copy requested   No - copy requested  Would patient like information on creating a medical advance directive?  Yes (ED - Information included in AVS) No  - patient declined information     Current Medications (verified) Outpatient Encounter Medications as of 05/01/2022  Medication Sig   ALPRAZolam (XANAX) 0.5 MG tablet TAKE 1 TABLET(0.5 MG) BY MOUTH AT BEDTIME AS NEEDED FOR ANXIETY OR SLEEP   amLODipine (NORVASC) 2.5 MG tablet Take 1 tablet (2.5 mg total) by mouth daily.   Cholecalciferol (VITAMIN D3) 2000 UNITS capsule Take 1 capsule (2,000 Units total) by mouth daily. (Patient taking differently: Take 2,000 Units by mouth 3 (three) times a week.)   galantamine (RAZADYNE) 12 MG tablet TAKE 1 TABLET BY MOUTH 2 TIMES DAILY   hydrochlorothiazide (MICROZIDE) 12.5 MG capsule TAKE ONE CAPSULE BY MOUTH EVERY DAY   ibuprofen (ADVIL,MOTRIN) 600 MG tablet Take 1 tablet (600 mg total) by mouth every 8 (eight) hours as needed for headache.   Lysine 1000 MG TABS Take 1 tablet (1,000 mg total) by mouth daily.   Multiple Vitamin (MULTI-VITAMIN PO) Take 1 tablet by mouth every morning.    pantoprazole (PROTONIX) 40 MG tablet Take 1 tablet (40 mg total) by mouth daily.   Peppermint Oil (IBGARD PO) Take by mouth.   polyethylene glycol powder (GLYCOLAX/MIRALAX) powder Take 17 g by mouth 2 (two) times daily as needed.   Probiotic Product (ULTRAFLORA IMMUNE HEALTH) 170 MG CAPS TAKE (1) CAPSULE DAILY.   traMADol (ULTRAM) 50 MG tablet TAKE 1-2 TABLETS BY MOUTH 2 TIMES DAILY AS NEEDED FOR PAIN   No facility-administered encounter medications on file as of 05/01/2022.    Allergies (verified) Crestor [rosuvastatin calcium], Olmesartan, Other, and  Shellfish allergy   History: Past Medical History:  Diagnosis Date   ANXIETY    GERD    HYPERLIPIDEMIA    HYPERTENSION    Memory loss    after son died in  2002   SHELLFISH ALLERGY    Past Surgical History:  Procedure Laterality Date   TUBAL LIGATION     Family History  Problem Relation Age of Onset   Heart attack Father 365   Breast cancer Sister    Cervical cancer Sister    Ovarian cancer Sister    Heart  attack Brother [id number]   Social History   Socioeconomic History   Marital status: Married    Spouse name: Not on file   Number of children: 2   Years of education: Not on file   Highest education level: Not on file  Occupational History   Occupation: Retired Technical sales engineertate Farm Agent  Tobacco Use   Smoking status: Never   Smokeless tobacco: Never  Substance and Sexual Activity   Alcohol use: No    Alcohol/week: 0.0 standard drinks of alcohol   Drug use: No   Sexual activity: Not on file  Other Topics Concern   Not on file  Social History Narrative   Not on file   Social Determinants of Health   Financial Resource Strain: Low Risk  (05/01/2022)   Overall Financial Resource Strain (CARDIA)    Difficulty of Paying Living Expenses: Not hard at all  Food Insecurity: No Food Insecurity (05/01/2022)   Hunger Vital Sign    Worried About Running Out of Food in the Last Year: Never true    Ran Out of Food in the Last Year: Never true  Transportation Needs: No Transportation Needs (05/01/2022)   PRAPARE - Administrator, Civil ServiceTransportation    Lack of Transportation (Medical): No    Lack of Transportation (Non-Medical): No  Physical Activity: Sufficiently Active (05/01/2022)   Exercise Vital Sign    Days of Exercise per Week: 7 days    Minutes of Exercise per Session: 30 min  Stress: No Stress Concern Present (05/01/2022)   [employer] - Occupational Stress Questionnaire    Feeling of Stress : Not at all  Social Connections: Socially Integrated (05/01/2022)   Social Connection and Isolation Panel [NHANES]    Frequency of Communication with Friends and Family: More than three times a week    Frequency of Social Gatherings with Friends and Family: More than three times a week    Attends Religious Services: More than 4 times per year    Active Member of [employer]ubs or Organizations: Yes    Attends Engineer, structuralClub or Organization Meetings: More than 4 times per year    Marital Status: Married    Tobacco  Counseling Counseling given: Not Answered   Clinical Intake:  Pre-visit preparation completed: Yes  Pain : No/denies pain     BMI - recorded: 26.91 Nutritional Status: BMI 25 -29 Overweight Nutritional Risks: None Diabetes: No  How often do you need to have someone help you when you read instructions, pamphlets, or other written materials from your doctor or pharmacy?: 1 - Never What is the last grade level you completed in school?: HSG; Bachelor's Degree and 1 year of Graduate School  Diabetic? no  Interpreter Needed?: No  Information entered by :: Susie CassetteShenika Audra Kagel, LPN.   Activities of Daily Living    05/01/2022    1:22 PM  In your present state of health, do you have any difficulty performing the  following activities:  Hearing? 0  Vision? 0  Difficulty concentrating or making decisions? 0  Walking or climbing stairs? 0  Dressing or bathing? 0  Doing errands, shopping? 0  Preparing Food and eating ? N  Using the Toilet? N  In the past six months, have you accidently leaked urine? N  Do you have problems with loss of bowel control? N  Managing your Medications? N  Managing your Finances? N  Housekeeping or managing your Housekeeping? N    Patient Care Team: Plotnikov, Georgina Quint, MD as PCP - General (Internal Medicine) Charna Elizabeth, MD as Attending Physician (Gastroenterology) Kathleene Hazel, MD as Attending Physician (Cardiology) Richardean Chimera, MD as Consulting Physician (Obstetrics and Gynecology) [facility] as Consulting Physician (Optometry)  Indicate any recent Medical Services you may have received from other than [facility] providers in the past year (date may be approximate).     Assessment:   This is a routine wellness examination for Shelley Bray.  Hearing/Vision screen Hearing Screening - Comments:: Patient denied any hearing difficulty.   No hearing aids.  Vision Screening - Comments:: Patient wears readers for small print. Eye  exam done by: [facility]  Dietary issues and exercise activities discussed: Current Exercise Habits: Home exercise routine, Type of exercise: walking, Time (Minutes): 30, Frequency (Times/Week): 7, Weekly Exercise (Minutes/Week): 210, Intensity: Moderate, Exercise limited by: Other - see comments (Chronic Fatigue)   Goals Addressed             This Visit's Progress    To maintain my current health status by continuing to eat healthy, stay physically active and socially active.        Depression Screen    05/01/2022    1:19 PM 03/08/2021    8:29 AM 03/05/2018    9:02 AM 02/28/2017   10:23 AM 02/28/2017    9:46 AM 02/28/2016    1:57 PM 02/18/2015    9:20 AM  PHQ 2/9 Scores  PHQ - 2 Score 0 0 0 0 0 0 0  PHQ- 9 Score  0  0       Fall Risk    05/01/2022    1:21 PM 03/08/2021    8:29 AM 06/09/2019    3:28 PM 03/05/2018    9:02 AM 02/28/2017    9:46 AM  Fall Risk   Falls in the past year? 0 0 1 No No  Comment   Emmi Telephone Survey: data to providers prior to load    Number falls in past yr: 0 0 1    Comment   Emmi Telephone Survey Actual Response = 1    Injury with Fall? 0 0 1    Risk for fall due to : No Fall Risks No Fall Risks     Follow up Falls evaluation completed        FALL RISK PREVENTION PERTAINING TO THE HOME:  Any stairs in or around the home? Yes  If so, are there any without handrails? No  Home free of loose throw rugs in walkways, pet beds, electrical cords, etc? Yes  Adequate lighting in your home to reduce risk of falls? Yes   ASSISTIVE DEVICES UTILIZED TO PREVENT FALLS:  Life alert? No  Use of a cane, walker or w/c? No  Grab bars in the bathroom? Yes  Shower chair or bench in shower? No  Elevated toilet seat or a handicapped toilet? No   TIMED UP AND GO:  Was the test  performed? No .  Length of time to ambulate 10 feet: n/a sec.   Appearance of gait: Patient not evaluated for gait during this visit.  Cognitive Function:         05/01/2022    1:24 PM  6CIT Screen  What Year? 0 points  What month? 0 points  What time? 0 points  Count back from 20 0 points  Months in reverse 0 points  Repeat phrase 0 points  Total Score 0 points    Immunizations Immunization History  Administered Date(s) Administered   Fluad Quad(high Dose 65+) 07/29/2019   Influenza, High Dose Seasonal PF 07/31/2014, 08/02/2015, 07/31/2017, 08/17/2020   Influenza, Seasonal, Injecte, Preservative Fre 07/29/2019   Influenza,inj,Quad PF,6+ Mos 07/21/2016   Influenza-Unspecified 08/13/2013, 07/18/2018, 08/12/2021   Moderna SARS-COV2 Booster Vaccination 09/14/2020, 05/24/2021   Moderna Sars-Covid-2 Vaccination 11/14/2019, 12/12/2019   Pfizer Covid-19 Vaccine Bivalent Booster 18yrs & up 08/12/2021   Pneumococcal Conjugate-13 02/28/2016   Zoster, Live 01/17/2011    TDAP status: Due, Education has been provided regarding the importance of this vaccine. Advised may receive this vaccine at local pharmacy or Health Dept. Aware to provide a copy of the vaccination record if obtained from local pharmacy or Health Dept. Verbalized acceptance and understanding.  Flu Vaccine status: Up to date  Pneumococcal vaccine status: Due, Education has been provided regarding the importance of this vaccine. Advised may receive this vaccine at local pharmacy or Health Dept. Aware to provide a copy of the vaccination record if obtained from local pharmacy or Health Dept. Verbalized acceptance and understanding.  Covid-19 vaccine status: Completed vaccines  Qualifies for Shingles Vaccine? Yes   Zostavax completed Yes   Shingrix Completed?: No.    Education has been provided regarding the importance of this vaccine. Patient has been advised to call insurance company to determine out of pocket expense if they have not yet received this vaccine. Advised may also receive vaccine at local pharmacy or Health Dept. Verbalized acceptance and understanding.  Screening  Tests Health Maintenance  Topic Date Due   Hepatitis C Screening  Never done   TETANUS/TDAP  Never done   Zoster Vaccines- Shingrix (1 of 2) Never done   DEXA SCAN  Never done   Pneumonia Vaccine 62+ Years old (2 - PPSV23 if available, else PCV20) 02/27/2017   COVID-19 Vaccine (4 - Moderna series) 12/13/2021   INFLUENZA VACCINE  06/06/2022   HPV VACCINES  Aged Out    Health Maintenance  Health Maintenance Due  Topic Date Due   Hepatitis C Screening  Never done   TETANUS/TDAP  Never done   Zoster Vaccines- Shingrix (1 of 2) Never done   DEXA SCAN  Never done   Pneumonia Vaccine 47+ Years old (2 - PPSV23 if available, else PCV20) 02/27/2017   COVID-19 Vaccine (4 - Moderna series) 12/13/2021    Colorectal cancer screening: Type of screening: Colonoscopy. Completed 09/26/2013. Repeat every 10 years  Mammogram status: Completed at OB/GYN office; need records. Repeat every year  Bone Density status: never done  Lung Cancer Screening: (Low Dose CT Chest recommended if Age 8-80 years, 30 pack-year currently smoking OR have quit w/in 15years.) does not qualify.   Lung Cancer Screening Referral: no  Additional Screening:  Hepatitis C Screening: does qualify; Completed no  Vision Screening: Recommended annual ophthalmology exams for early detection of glaucoma and other disorders of the eye. Is the patient up to date with their annual eye exam?  Yes  Who is the  provider or what is the name of the office in which the patient attends annual eye exams? [facility] If pt is not established with a provider, would they like to be referred to a provider to establish care? No .   Dental Screening: Recommended annual dental exams for proper oral hygiene  Community Resource Referral / Chronic Care Management: CRR required this visit?  No   CCM required this visit?  No      Plan:     I have personally reviewed and noted the following in the patient's chart:    Medical and social history Use of alcohol, tobacco or illicit drugs  Current medications and supplements including opioid prescriptions.  Functional ability and status Nutritional status Physical activity Advanced directives List of other physicians Hospitalizations, surgeries, and ER visits in previous 12 months Vitals Screenings to include cognitive, depression, and falls Referrals and appointments  In addition, I have reviewed and discussed with patient certain preventive protocols, quality metrics, and best practice recommendations. A written personalized care plan for preventive services as well as general preventive health recommendations were provided to patient.     Mickeal Needy, LPN   [id number]/[id number]   Nurse Notes:  Patient is cogitatively intact. There were no vitals filed for this visit. There is no height or weight on file to calculate BMI. Patient stated that she has no issues with gait or balance; does not use any assistive devices. Medications reviewed with patient; no opioid use noted.    Medical screening examination/treatment/procedure(s) were performed by non-physician practitioner and as supervising physician I was immediately available for consultation/collaboration.  I agree with above. Jacinta Shoe, MD

## 2022-05-02 ENCOUNTER — Telehealth: Payer: Self-pay | Admitting: Internal Medicine

## 2022-05-02 ENCOUNTER — Other Ambulatory Visit: Payer: Self-pay | Admitting: Internal Medicine

## 2022-05-04 NOTE — Telephone Encounter (Signed)
Caller & Relationship to patient: Shelley Bray  Call back number: [phone]  Date of last office visit: 02/13/22  Date of next office visit: 08/16/22  Medication(s) to be refilled:  ALPRAZolam [name]) 0.5 MG tablet      Preferred Pharmacy:  [facility] [phone] - [place], [place] - [address] [facility] [address] & [facility] Phone:  [phone]  Fax:  [phone]

## 2022-05-07 MED ORDER — ALPRAZOLAM 0.5 MG PO TABS: ORAL_TABLET | ORAL | 3 refills | Status: DC

## 2022-05-07 NOTE — Telephone Encounter (Signed)
Okay.  Done.  Thanks 

## 2022-05-07 NOTE — Addendum Note (Signed)
Addended by: Tresa Garter on: 05/07/2022 11:40 PM   Modules accepted: Orders

## 2022-06-20 ENCOUNTER — Ambulatory Visit: Payer: Self-pay | Admitting: Licensed Clinical Social Worker

## 2022-06-20 NOTE — Patient Outreach (Signed)
  Care Coordination   Initial Visit Note   06/20/2022 Name: Shelley Bray MRN: [id number] DOB: [birth year]  Shelley Bray is a 76 y.o. year old female who sees Plotnikov, Georgina Quint, MD for primary care. I spoke with  [name] by phone today  What matters to the patients health and wellness today?  Client is stable and did not think she needed program support at present but may be interested in talking more about program in the future    Goals Addressed               This Visit's Progress     Patient Stated she was stable at present but was interested in Care Coordination program. She wrote down name of program. she wrote down name of LCSW and phone number of LCSW (pt-stated)        Care Coordination Interventions:  Active listening / Reflection utilized  Described Care Coordination program support to client Discussed client needs. She said she was stable and doing well with her needs. She was interested in Care coordination program support. She wrote down name of program and name and phone number of LCSW.  She said at present, she did not need this support but would keep information in case she needed such support in the future Reviewed medication procurement of client     SDOH assessments and interventions completed:  No     Care Coordination Interventions Activated:  No  Care Coordination Interventions:  No, not indicated   Follow up plan: No further intervention required.   Encounter Outcome:  Pt. Visit Completed

## 2022-06-20 NOTE — Patient Instructions (Addendum)
Visit Information  Thank you for taking time to visit with me today. Please don't hesitate to contact me if I can be of assistance to you before our next scheduled telephone appointment.  Following are the goals we discussed today:   No further intervention is needed at present  Please call the care guide team at [phone] if you need to cancel or reschedule your appointment.   If you are experiencing a Mental Health or Behavioral Health Crisis or need someone to talk to, please go to [facility] [address], [place] [phone])   Following is a copy of your full plan of care:   Care Coordination Interventions:  Active listening / Reflection utilized  Described Care Coordination program support to client Discussed client needs. She said she was stable and doing well with her needs. She was interested in Care coordination program support. She wrote down name of program and name and phone number of LCSW.  She said at present, she did not need this support but would keep information in case she needed such support in the future Reviewed medication procurement of client    Ms. Tates was given information about Care Management services by the embedded care coordination team including:  Care Management services include personalized support from designated clinical staff supervised by her physician, including individualized plan of care and coordination with other care providers 24/7 contact phone numbers for assistance for urgent and routine care needs. The patient may stop CCM services at any time (effective at the end of the month) by phone call to the office staff.  Patient agreed to services and verbal consent obtained.   Kelton Pillar.Dorella Laster MSW, LCSW Licensed Visual merchandiser [facility] Care Management [phone]

## 2022-07-15 LAB — GLUCOSE, POCT (MANUAL RESULT ENTRY): POC Glucose: 125 mg/dl — AB (ref 70–99)

## 2022-07-21 ENCOUNTER — Ambulatory Visit (INDEPENDENT_AMBULATORY_CARE_PROVIDER_SITE_OTHER): Payer: Self-pay | Admitting: Plastic Surgery

## 2022-07-21 ENCOUNTER — Encounter: Payer: Self-pay | Admitting: Plastic Surgery

## 2022-07-21 VITALS — BP 161/89 | HR 80 | Ht 67.0 in | Wt 179.7 lb

## 2022-07-21 DIAGNOSIS — Z411 Encounter for cosmetic surgery: Secondary | ICD-10-CM

## 2022-07-21 NOTE — Progress Notes (Signed)
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 I discussed with the patient this proposed procedure of botulinum toxin injections, which is customized depending on the particular needs of the patient. It is performed on facial rhytids as a temporary correction. The alternatives were discussed with the patient. The risks were addressed including bleeding, scarring, infection, damage to deeper structures, asymmetry, and chronic pain, which may occur infrequently after a procedure. The individual's choice to undergo a surgical procedure is based on the comparison of risks to potential benefits. Other risks include unsatisfactory results, brow ptosis, eyelid ptosis, allergic reaction, temporary paralysis, which should go away with time, bruising, blurring disturbances and delayed healing. Botulinum toxin injections do not arrest the aging process or produce permanent tightening of the eyelid.  Operative intervention maybe necessary to maintain the results of a blepharoplasty or botulinum toxin. The patient understands and wishes to proceed.  Procedure: The area was prepped with alcohol and dried with a clean gauze. Using a clean technique, the botulinum toxin was diluted with 2.5 cc of preservative-free normal saline which was slowly injected with an 18 gauge needle in a tuberculin syringes.  A 32 gauge needles were then used to inject the botulinum toxin. This mixture allow for an aliquot of 4 units per 0.1 cc in each injection site.    Subsequently the mixture was injected into the following regions: 10 U of botox was injected into the forehead. 15 U of botox was injected into the glabella.   Botox LOT:  [id number] EXP:  09/2024  Resylane Lyft LOT: [id number] EXP: 03/2024  Lyft injected bilateral  nasolabial folds.

## 2022-08-16 ENCOUNTER — Ambulatory Visit (INDEPENDENT_AMBULATORY_CARE_PROVIDER_SITE_OTHER): Payer: Medicare Other | Admitting: Internal Medicine

## 2022-08-16 ENCOUNTER — Encounter: Payer: Self-pay | Admitting: Internal Medicine

## 2022-08-16 ENCOUNTER — Telehealth: Payer: Self-pay | Admitting: *Deleted

## 2022-08-16 DIAGNOSIS — I10 Essential (primary) hypertension: Secondary | ICD-10-CM | POA: Diagnosis not present

## 2022-08-16 DIAGNOSIS — R202 Paresthesia of skin: Secondary | ICD-10-CM | POA: Diagnosis not present

## 2022-08-16 DIAGNOSIS — N390 Urinary tract infection, site not specified: Secondary | ICD-10-CM

## 2022-08-16 DIAGNOSIS — R252 Cramp and spasm: Secondary | ICD-10-CM

## 2022-08-16 DIAGNOSIS — R5382 Chronic fatigue, unspecified: Secondary | ICD-10-CM | POA: Diagnosis not present

## 2022-08-16 DIAGNOSIS — F419 Anxiety disorder, unspecified: Secondary | ICD-10-CM

## 2022-08-16 LAB — TSH: TSH: 1.05 u[IU]/mL (ref 0.35–5.50)

## 2022-08-16 LAB — CBC WITH DIFFERENTIAL/PLATELET
Basophils Absolute: 0 K/uL (ref 0.0–0.1)
Basophils Relative: 0.4 % (ref 0.0–3.0)
Eosinophils Absolute: 0.1 K/uL (ref 0.0–0.7)
Eosinophils Relative: 1.3 % (ref 0.0–5.0)
HCT: 37 % (ref 36.0–46.0)
Hemoglobin: 12.2 g/dL (ref 12.0–15.0)
Lymphocytes Relative: 26.8 % (ref 12.0–46.0)
Lymphs Abs: 1.6 K/uL (ref 0.7–4.0)
MCHC: 33 g/dL (ref 30.0–36.0)
MCV: 86.8 fl (ref 78.0–100.0)
Monocytes Absolute: 0.4 K/uL (ref 0.1–1.0)
Monocytes Relative: 7.1 % (ref 3.0–12.0)
Neutro Abs: 3.8 K/uL (ref 1.4–7.7)
Neutrophils Relative %: 64.4 % (ref 43.0–77.0)
Platelets: 267 K/uL (ref 150.0–400.0)
RBC: 4.26 Mil/uL (ref 3.87–5.11)
RDW: 13.1 % (ref 11.5–15.5)
WBC: 5.9 K/uL (ref 4.0–10.5)

## 2022-08-16 LAB — T4, FREE: Free T4: 0.88 ng/dL (ref 0.60–1.60)

## 2022-08-16 LAB — VITAMIN B12: Vitamin B-12: 861 pg/mL (ref 211–911)

## 2022-08-16 LAB — URINALYSIS, ROUTINE W REFLEX MICROSCOPIC
Bilirubin Urine: NEGATIVE
Hgb urine dipstick: NEGATIVE
Ketones, ur: NEGATIVE
Nitrite: POSITIVE — AB
RBC / HPF: NONE SEEN
Specific Gravity, Urine: 1.015 (ref 1.000–1.030)
Urine Glucose: NEGATIVE
Urobilinogen, UA: 0.2 (ref 0.0–1.0)
pH: 8 (ref 5.0–8.0)

## 2022-08-16 LAB — COMPREHENSIVE METABOLIC PANEL
ALT: 11 U/L (ref 0–35)
AST: 18 U/L (ref 0–37)
Albumin: 4.3 g/dL (ref 3.5–5.2)
Alkaline Phosphatase: 128 U/L — ABNORMAL HIGH (ref 39–117)
BUN: 17 mg/dL (ref 6–23)
CO2: 30 mEq/L (ref 19–32)
Calcium: 9.8 mg/dL (ref 8.4–10.5)
Chloride: 103 mEq/L (ref 96–112)
Creatinine, Ser: 0.77 mg/dL (ref 0.40–1.20)
GFR: 74.09 mL/min (ref >60.00)
Glucose, Bld: 83 mg/dL (ref 70–99)
Potassium: 4 mEq/L (ref 3.5–5.1)
Sodium: 141 mEq/L (ref 135–145)
Total Bilirubin: 0.7 mg/dL (ref 0.2–1.2)
Total Protein: 7.6 g/dL (ref 6.0–8.3)

## 2022-08-16 MED ORDER — METHYLPHENIDATE HCL 5 MG PO TABS: 2.5000 mg | ORAL_TABLET | Freq: Two times a day (BID) | ORAL | 0 refills | Status: DC

## 2022-08-16 NOTE — Telephone Encounter (Signed)
Rec'd fax pt needing PA omn Methylphenidate 5 mg. Completed and submitted w/ (Key: [id number]). Rec'd msg PA sent to [facility].Marland KitchenAndee Poles

## 2022-08-16 NOTE — Patient Instructions (Signed)
Magnesium oil spray for cramps 

## 2022-08-16 NOTE — Assessment & Plan Note (Signed)
Poss due to HCTZ Magnesium oil spray for cramps

## 2022-08-16 NOTE — Assessment & Plan Note (Signed)
BP is nl at home HCTZ

## 2022-08-16 NOTE — Progress Notes (Signed)
Subjective:  Patient ID: Shelley Bray, female    DOB: [birth year]  Age: 76 y.o. MRN: [id number]  CC: Follow-up (6 month f/u)   HPI Shelley Bray presents for CFS, anxiety, HTN  Outpatient Medications Prior to Visit  Medication Sig Dispense Refill   ALPRAZolam (XANAX) 0.5 MG tablet TAKE 1 TABLET(0.5 MG) BY MOUTH AT BEDTIME AS NEEDED FOR ANXIETY OR SLEEP 90 tablet 3   amLODipine (NORVASC) 2.5 MG tablet Take 1 tablet (2.5 mg total) by mouth daily. 90 tablet 3   Cholecalciferol (VITAMIN D3) 2000 UNITS capsule Take 1 capsule (2,000 Units total) by mouth daily. (Patient taking differently: Take 2,000 Units by mouth 3 (three) times a week.) 100 capsule 3   galantamine (RAZADYNE) 12 MG tablet TAKE 1 TABLET BY MOUTH 2 TIMES DAILY 180 tablet 3   hydrochlorothiazide (MICROZIDE) 12.5 MG capsule TAKE ONE CAPSULE BY MOUTH EVERY DAY 90 capsule 3   ibuprofen (ADVIL,MOTRIN) 600 MG tablet Take 1 tablet (600 mg total) by mouth every 8 (eight) hours as needed for headache. 60 tablet 1   Lysine 1000 MG TABS Take 1 tablet (1,000 mg total) by mouth daily. 90 tablet 3   Multiple Vitamin (MULTI-VITAMIN PO) Take 1 tablet by mouth every morning.      pantoprazole (PROTONIX) 40 MG tablet Take 1 tablet (40 mg total) by mouth daily. 90 tablet 3   Peppermint Oil (IBGARD PO) Take by mouth.     polyethylene glycol powder (GLYCOLAX/MIRALAX) powder Take 17 g by mouth 2 (two) times daily as needed. 500 g 3   Probiotic Product ([place]) 170 MG CAPS TAKE (1) CAPSULE DAILY. 60 capsule PRN   traMADol (ULTRAM) 50 MG tablet TAKE 1-2 TABLETS BY MOUTH 2 TIMES DAILY AS NEEDED FOR PAIN 180 tablet 1   No facility-administered medications prior to visit.    ROS: Review of Systems  Constitutional:  Negative for activity change, appetite change, chills, fatigue and unexpected weight change.  HENT:  Negative for congestion, mouth sores and sinus pressure.   Eyes:  Negative for visual disturbance.  Respiratory:   Negative for cough and chest tightness.   Gastrointestinal:  Negative for abdominal pain and nausea.  Genitourinary:  Negative for difficulty urinating, frequency and vaginal pain.  Musculoskeletal:  Negative for back pain and gait problem.  Skin:  Negative for pallor and rash.  Neurological:  Negative for dizziness, tremors, weakness, numbness and headaches.  Psychiatric/Behavioral:  Positive for decreased concentration and sleep disturbance. Negative for confusion and suicidal ideas. The patient is not nervous/anxious.     Objective:  BP 138/68 (BP Location: Left Arm)   Pulse 80   Temp 98.4 F (36.9 C) (Oral)   Ht 5\' 7"  (1.702 m)   Wt 175 lb 3.2 oz (79.5 kg)   SpO2 98%   BMI 27.44 kg/m   BP Readings from Last 3 Encounters:  08/16/22 138/68  07/21/22 (!) 161/89  07/15/22 (!) 143/78    Wt Readings from Last 3 Encounters:  08/16/22 175 lb 3.2 oz (79.5 kg)  07/21/22 179 lb 11.2 oz (81.5 kg)  10/18/21 171 lb 12.8 oz (77.9 kg)    Physical Exam Constitutional:      General: She is not in acute distress.    Appearance: She is well-developed. She is obese.  HENT:     Head: Normocephalic.     Right Ear: External ear normal.     Left Ear: External ear normal.     Nose: Nose  normal.  Eyes:     General:        Right eye: No discharge.        Left eye: No discharge.     Conjunctiva/sclera: Conjunctivae normal.     Pupils: Pupils are equal, round, and reactive to light.  Neck:     Thyroid: No thyromegaly.     Vascular: No JVD.     Trachea: No tracheal deviation.  Cardiovascular:     Rate and Rhythm: Normal rate and regular rhythm.     Heart sounds: Normal heart sounds.  Pulmonary:     Effort: No respiratory distress.     Breath sounds: No stridor. No wheezing.  Abdominal:     General: Bowel sounds are normal. There is no distension.     Palpations: Abdomen is soft. There is no mass.     Tenderness: There is no abdominal tenderness. There is no guarding or rebound.   Musculoskeletal:        General: No tenderness.     Cervical back: Normal range of motion and neck supple. No rigidity.  Lymphadenopathy:     Cervical: No cervical adenopathy.  Skin:    Findings: No erythema or rash.  Neurological:     Cranial Nerves: No cranial nerve deficit.     Motor: No abnormal muscle tone.     Coordination: Coordination normal.     Deep Tendon Reflexes: Reflexes normal.  Psychiatric:        Behavior: Behavior normal.        Thought Content: Thought content normal.        Judgment: Judgment normal.     Lab Results  Component Value Date   WBC 5.0 03/08/2021   HGB 12.2 03/08/2021   HCT 36.4 03/08/2021   PLT 247.0 03/08/2021   GLUCOSE 81 04/20/2021   CHOL 212 (H) 09/08/2020   TRIG 92.0 09/08/2020   HDL 66.30 09/08/2020   LDLDIRECT 152.9 10/14/2012   LDLCALC 127 (H) 09/08/2020   ALT 12 04/20/2021   AST 15 04/20/2021   NA 141 04/20/2021   K 4.0 04/20/2021   CL 105 04/20/2021   CREATININE 0.70 04/20/2021   BUN 19 04/20/2021   CO2 29 04/20/2021   TSH 1.73 04/20/2021   INR 1.1 (H) 03/17/2021    US Abdomen Limited RUQ (LIVER/GB)  Result Date: 05/19/2021 CLINICAL DATA:  Elevated alkaline phosphatase.  Chronic fatigue. EXAM: ULTRASOUND ABDOMEN LIMITED RIGHT UPPER QUADRANT COMPARISON:  CT abdomen 07/31/2018 FINDINGS: Gallbladder: No gallstones or wall thickening visualized. No sonographic Murphy sign noted by sonographer. Common bile duct: Diameter: 0.5 cm, within normal limits Liver: Small hepatic cysts as shown on CT scan of 07/31/2018. Echogenicity in the upper normal range. Portal vein is patent on color Doppler imaging with normal direction of blood flow towards the liver. Other: None. IMPRESSION: 1. Small hepatic cysts. 2. No biliary dilatation or gallbladder abnormality observed. 3. Hepatic echogenicity is in the upper normal range but not overtly accentuated. Electronically Signed   By: Shelley Bray M.D.   On: 05/19/2021 10:42    Assessment &  Plan:   Problem List Items Addressed This Visit     Chronic fatigue    Worse Re-start Ritalin low dose  Potential benefits of a long term amphetamines  use as well as potential risks  and complications were explained to the patient and were aknowledged.        Essential hypertension    BP is nl at home HCTZ  Leg cramps    Poss due to HCTZ Magnesium oil spray for cramps         Meds ordered this encounter  Medications   methylphenidate (RITALIN) 5 MG tablet    Sig: Take 0.5-1 tablets (2.5-5 mg total) by mouth 2 (two) times daily with breakfast and lunch.    Dispense:  60 tablet    Refill:  0      Follow-up: Return in about 3 months (around 11/16/2022) for a follow-up visit.  Sonda Primes, MD

## 2022-08-16 NOTE — Assessment & Plan Note (Addendum)
Worse Re-start Ritalin low dose  Potential benefits of a long term amphetamines  use as well as potential risks  and complications were explained to the patient and were aknowledged.

## 2022-08-17 NOTE — Telephone Encounter (Signed)
[employer] is calling asking for the diagnosis associated with this. Reference case number: [id number]

## 2022-08-17 NOTE — Telephone Encounter (Signed)
Rec'd dx form for med faxed back for approval status../lm,b

## 2022-08-18 DIAGNOSIS — N390 Urinary tract infection, site not specified: Secondary | ICD-10-CM | POA: Insufficient documentation

## 2022-08-18 MED ORDER — CEPHALEXIN 500 MG PO CAPS: 500.0000 mg | ORAL_CAPSULE | Freq: Three times a day (TID) | ORAL | 0 refills | Status: DC

## 2022-08-18 NOTE — Assessment & Plan Note (Signed)
We will treat with Keflex

## 2022-08-18 NOTE — Addendum Note (Signed)
Addended by: Cassandria Anger on: 08/18/2022 08:46 AM   Modules accepted: Orders

## 2022-08-21 NOTE — Telephone Encounter (Signed)
Rec'd determination vback med was This approval is for 08/16/2022 until further notice. This drug has been approved under the Member's Medicare Part D benefit. Approved quantity: 60 tablets per 30 day(s). You may fill up to a 90 day supply except for those on Specialty Tier 5, which can be filled up to a 30 day supply. Faxed approval to [facility].Marland KitchenJohny Chess

## 2022-09-09 ENCOUNTER — Other Ambulatory Visit: Payer: Self-pay | Admitting: Internal Medicine

## 2022-09-22 ENCOUNTER — Other Ambulatory Visit: Payer: Self-pay | Admitting: Internal Medicine

## 2022-10-12 ENCOUNTER — Other Ambulatory Visit: Payer: Self-pay | Admitting: Internal Medicine

## 2022-11-16 ENCOUNTER — Ambulatory Visit (INDEPENDENT_AMBULATORY_CARE_PROVIDER_SITE_OTHER): Payer: Medicare Other | Admitting: Internal Medicine

## 2022-11-16 ENCOUNTER — Encounter: Payer: Self-pay | Admitting: Internal Medicine

## 2022-11-16 VITALS — BP 134/80 | HR 85 | Temp 98.2°F | Ht 67.0 in | Wt 178.0 lb

## 2022-11-16 DIAGNOSIS — K219 Gastro-esophageal reflux disease without esophagitis: Secondary | ICD-10-CM

## 2022-11-16 DIAGNOSIS — I10 Essential (primary) hypertension: Secondary | ICD-10-CM | POA: Diagnosis not present

## 2022-11-16 DIAGNOSIS — E669 Obesity, unspecified: Secondary | ICD-10-CM | POA: Insufficient documentation

## 2022-11-16 DIAGNOSIS — N3090 Cystitis, unspecified without hematuria: Secondary | ICD-10-CM

## 2022-11-16 DIAGNOSIS — K5710 Diverticulosis of small intestine without perforation or abscess without bleeding: Secondary | ICD-10-CM | POA: Insufficient documentation

## 2022-11-16 DIAGNOSIS — K5901 Slow transit constipation: Secondary | ICD-10-CM | POA: Insufficient documentation

## 2022-11-16 DIAGNOSIS — K5900 Constipation, unspecified: Secondary | ICD-10-CM | POA: Insufficient documentation

## 2022-11-16 DIAGNOSIS — F419 Anxiety disorder, unspecified: Secondary | ICD-10-CM

## 2022-11-16 DIAGNOSIS — Z1211 Encounter for screening for malignant neoplasm of colon: Secondary | ICD-10-CM | POA: Insufficient documentation

## 2022-11-16 DIAGNOSIS — R413 Other amnesia: Secondary | ICD-10-CM

## 2022-11-16 DIAGNOSIS — R5382 Chronic fatigue, unspecified: Secondary | ICD-10-CM | POA: Diagnosis not present

## 2022-11-16 DIAGNOSIS — E66811 Obesity, class 1: Secondary | ICD-10-CM | POA: Insufficient documentation

## 2022-11-16 DIAGNOSIS — K5730 Diverticulosis of large intestine without perforation or abscess without bleeding: Secondary | ICD-10-CM | POA: Insufficient documentation

## 2022-11-16 DIAGNOSIS — R252 Cramp and spasm: Secondary | ICD-10-CM

## 2022-11-16 NOTE — Assessment & Plan Note (Signed)
On Protonix Resolved on Protonix, gluten free diet

## 2022-11-16 NOTE — Progress Notes (Signed)
Subjective:  Patient ID: Shelley Bray, female    DOB: 06-08-1946  Age: 77 y.o. MRN: [id number]  CC: Follow-up   HPI Shelley Bray presents for memory issues, hypertension, GERD   Outpatient Medications Prior to Visit  Medication Sig Dispense Refill   ALPRAZolam (XANAX) 0.5 MG tablet TAKE 1 TABLET(0.5 MG) BY MOUTH AT BEDTIME AS NEEDED FOR ANXIETY OR SLEEP 90 tablet 3   amLODipine (NORVASC) 2.5 MG tablet TAKE 1 TABLET(2.5 MG) BY MOUTH DAILY 90 tablet 3   Cholecalciferol (VITAMIN D3) 2000 UNITS capsule Take 1 capsule (2,000 Units total) by mouth daily. (Patient taking differently: Take 2,000 Units by mouth 3 (three) times a week.) 100 capsule 3   galantamine (RAZADYNE) 12 MG tablet TAKE 1 TABLET BY MOUTH TWICE DAILY 180 tablet 3   hydrochlorothiazide (MICROZIDE) 12.5 MG capsule TAKE ONE CAPSULE BY MOUTH EVERY DAY 90 capsule 3   ibuprofen (ADVIL,MOTRIN) 600 MG tablet Take 1 tablet (600 mg total) by mouth every 8 (eight) hours as needed for headache. 60 tablet 1   Lysine 1000 MG TABS Take 1 tablet (1,000 mg total) by mouth daily. 90 tablet 3   Multiple Vitamin (MULTI-VITAMIN PO) Take 1 tablet by mouth every morning.      pantoprazole (PROTONIX) 40 MG tablet TAKE 1 TABLET(40 MG) BY MOUTH DAILY 90 tablet 3   Peppermint Oil (IBGARD PO) Take by mouth.     polyethylene glycol powder (GLYCOLAX/MIRALAX) powder Take 17 g by mouth 2 (two) times daily as needed. 500 g 3   Probiotic Product ([place]) 170 MG CAPS TAKE (1) CAPSULE DAILY. 60 capsule PRN   traMADol (ULTRAM) 50 MG tablet TAKE 1 TO 2 TABLETS BY MOUTH TWICE DAILY AS NEEDED FOR PAIN 180 tablet 1   cephALEXin (KEFLEX) 500 MG capsule Take 1 capsule (500 mg total) by mouth 3 (three) times daily. (Patient not taking: Reported on 11/16/2022) 15 capsule 0   methylphenidate (RITALIN) 5 MG tablet Take 0.5-1 tablets (2.5-5 mg total) by mouth 2 (two) times daily with breakfast and lunch. 60 tablet 0   No facility-administered  medications prior to visit.    ROS: Review of Systems  Constitutional:  Negative for activity change, appetite change, chills, fatigue and unexpected weight change.  HENT:  Negative for congestion, mouth sores and sinus pressure.   Eyes:  Negative for visual disturbance.  Respiratory:  Negative for cough and chest tightness.   Gastrointestinal:  Negative for abdominal pain and nausea.  Genitourinary:  Negative for difficulty urinating, frequency and vaginal pain.  Musculoskeletal:  Positive for arthralgias. Negative for back pain and gait problem.  Skin:  Negative for pallor and rash.  Neurological:  Negative for dizziness, tremors, weakness, numbness and headaches.  Psychiatric/Behavioral:  Positive for decreased concentration. Negative for confusion, sleep disturbance and suicidal ideas. The patient is not nervous/anxious.     Objective:  BP 134/80 (BP Location: Left Arm, Patient Position: Sitting, Cuff Size: Normal)   Pulse 85   Temp 98.2 F (36.8 C) (Oral)   Ht 5\' 7"  (1.702 m)   Wt 178 lb (80.7 kg)   SpO2 100%   BMI 27.88 kg/m   BP Readings from Last 3 Encounters:  11/16/22 134/80  08/16/22 138/68  07/21/22 (!) 161/89    Wt Readings from Last 3 Encounters:  11/16/22 178 lb (80.7 kg)  08/16/22 175 lb 3.2 oz (79.5 kg)  07/21/22 179 lb 11.2 oz (81.5 kg)    Physical Exam Constitutional:  General: She is not in acute distress.    Appearance: Normal appearance. She is well-developed.  HENT:     Head: Normocephalic.     Right Ear: External ear normal.     Left Ear: External ear normal.     Nose: Nose normal.  Eyes:     General:        Right eye: No discharge.        Left eye: No discharge.     Conjunctiva/sclera: Conjunctivae normal.     Pupils: Pupils are equal, round, and reactive to light.  Neck:     Thyroid: No thyromegaly.     Vascular: No JVD.     Trachea: No tracheal deviation.  Cardiovascular:     Rate and Rhythm: Normal rate and regular rhythm.      Heart sounds: Normal heart sounds.  Pulmonary:     Effort: No respiratory distress.     Breath sounds: No stridor. No wheezing.  Abdominal:     General: Bowel sounds are normal. There is no distension.     Palpations: Abdomen is soft. There is no mass.     Tenderness: There is no abdominal tenderness. There is no guarding or rebound.  Musculoskeletal:        General: No tenderness.     Cervical back: Normal range of motion and neck supple. No rigidity.  Lymphadenopathy:     Cervical: No cervical adenopathy.  Skin:    Findings: No erythema or rash.  Neurological:     Mental Status: She is oriented to person, place, and time.     Cranial Nerves: No cranial nerve deficit.     Motor: No abnormal muscle tone.     Coordination: Coordination normal.     Deep Tendon Reflexes: Reflexes normal.  Psychiatric:        Behavior: Behavior normal.        Thought Content: Thought content normal.        Judgment: Judgment normal.     Lab Results  Component Value Date   WBC 5.9 08/16/2022   HGB 12.2 08/16/2022   HCT 37.0 08/16/2022   PLT 267.0 08/16/2022   GLUCOSE 83 08/16/2022   CHOL 212 (H) 09/08/2020   TRIG 92.0 09/08/2020   HDL 66.30 09/08/2020   LDLDIRECT 152.9 10/14/2012   LDLCALC 127 (H) 09/08/2020   ALT 11 08/16/2022   AST 18 08/16/2022   NA 141 08/16/2022   K 4.0 08/16/2022   CL 103 08/16/2022   CREATININE 0.77 08/16/2022   BUN 17 08/16/2022   CO2 30 08/16/2022   TSH 1.05 08/16/2022   INR 1.1 (H) 03/17/2021    US Abdomen Limited RUQ (LIVER/GB)  Result Date: 05/19/2021 CLINICAL DATA:  Elevated alkaline phosphatase.  Chronic fatigue. EXAM: ULTRASOUND ABDOMEN LIMITED RIGHT UPPER QUADRANT COMPARISON:  CT abdomen 07/31/2018 FINDINGS: Gallbladder: No gallstones or wall thickening visualized. No sonographic Murphy sign noted by sonographer. Common bile duct: Diameter: 0.5 cm, within normal limits Liver: Small hepatic cysts as shown on CT scan of 07/31/2018. Echogenicity in the  upper normal range. Portal vein is patent on color Doppler imaging with normal direction of blood flow towards the liver. Other: None. IMPRESSION: 1. Small hepatic cysts. 2. No biliary dilatation or gallbladder abnormality observed. 3. Hepatic echogenicity is in the upper normal range but not overtly accentuated. Electronically Signed   By: Van Clines M.D.   On: 05/19/2021 10:42    Assessment & Plan:   Problem List Items Addressed  This Visit       Cardiovascular and Mediastinum   Essential hypertension - Primary    Olmesartan 20 - d/c due to side effects 12/22. Continue with HCTZ and Norvasc Try to walk 5000 steps a day        Digestive   GERD (gastroesophageal reflux disease)    On Protonix Resolved on Protonix, gluten free diet        Other   Memory problem    PTSD related: Mild cognitive deficit Continue with Razadine Ritalin was discontinued      Leg cramps    Use magnesium oil spray as needed      Chronic fatigue    Off Ritalin      Anxiety disorder    Xanax prn  Potential benefits of a long term benzodiazepines  use as well as potential risks  and complications were explained to the patient and were aknowledged.      Other Visit Diagnoses     Cystitis       Relevant Orders   Urinalysis         No orders of the defined types were placed in this encounter.     Follow-up: Return in about 6 months (around 05/17/2023) for a follow-up visit.  Sonda Primes, MD

## 2022-11-16 NOTE — Assessment & Plan Note (Signed)
Off Ritalin

## 2022-11-16 NOTE — Assessment & Plan Note (Signed)
Xanax prn  Potential benefits of a long term benzodiazepines  use as well as potential risks  and complications were explained to the patient and were aknowledged. 

## 2022-11-16 NOTE — Assessment & Plan Note (Signed)
Olmesartan 20 - d/c due to side effects 12/22. Continue with HCTZ and Norvasc Try to walk 5000 steps a day

## 2022-11-19 NOTE — Assessment & Plan Note (Signed)
Use magnesium oil spray as needed

## 2022-11-19 NOTE — Assessment & Plan Note (Signed)
PTSD related: Mild cognitive deficit Continue with Razadine Ritalin was discontinued

## 2022-11-23 ENCOUNTER — Encounter: Payer: Self-pay | Admitting: Internal Medicine

## 2022-11-23 ENCOUNTER — Other Ambulatory Visit: Payer: Self-pay | Admitting: Internal Medicine

## 2022-11-27 MED ORDER — CEPHALEXIN 500 MG PO CAPS: 500.0000 mg | ORAL_CAPSULE | Freq: Three times a day (TID) | ORAL | 0 refills | Status: DC

## 2023-02-07 ENCOUNTER — Encounter: Payer: Self-pay | Admitting: Internal Medicine

## 2023-02-07 ENCOUNTER — Ambulatory Visit (INDEPENDENT_AMBULATORY_CARE_PROVIDER_SITE_OTHER): Payer: Medicare Other | Admitting: Internal Medicine

## 2023-02-07 VITALS — BP 138/80 | HR 80 | Temp 98.1°F | Ht 67.0 in | Wt 175.0 lb

## 2023-02-07 DIAGNOSIS — F419 Anxiety disorder, unspecified: Secondary | ICD-10-CM

## 2023-02-07 DIAGNOSIS — R413 Other amnesia: Secondary | ICD-10-CM | POA: Diagnosis not present

## 2023-02-07 DIAGNOSIS — I10 Essential (primary) hypertension: Secondary | ICD-10-CM | POA: Diagnosis not present

## 2023-02-07 DIAGNOSIS — F5101 Primary insomnia: Secondary | ICD-10-CM

## 2023-02-07 DIAGNOSIS — N3090 Cystitis, unspecified without hematuria: Secondary | ICD-10-CM

## 2023-02-07 LAB — COMPREHENSIVE METABOLIC PANEL
ALT: 9 U/L (ref 0–35)
AST: 13 U/L (ref 0–37)
Albumin: 4.4 g/dL (ref 3.5–5.2)
Alkaline Phosphatase: 113 U/L (ref 39–117)
BUN: 12 mg/dL (ref 6–23)
CO2: 29 mEq/L (ref 19–32)
Calcium: 9.7 mg/dL (ref 8.4–10.5)
Chloride: 105 mEq/L (ref 96–112)
Creatinine, Ser: 0.76 mg/dL (ref 0.40–1.20)
GFR: 75.01 mL/min (ref >60.00)
Glucose, Bld: 83 mg/dL (ref 70–99)
Potassium: 3.9 mEq/L (ref 3.5–5.1)
Sodium: 142 mEq/L (ref 135–145)
Total Bilirubin: 0.7 mg/dL (ref 0.2–1.2)
Total Protein: 7.4 g/dL (ref 6.0–8.3)

## 2023-02-07 MED ORDER — HYDROXYZINE HCL 25 MG PO TABS: 12.5000 mg | ORAL_TABLET | Freq: Every evening | ORAL | 2 refills | Status: DC | PRN

## 2023-02-07 NOTE — Assessment & Plan Note (Addendum)
Chronic insomnia, anxiety. Pt stopped Xanax due to alledged mild memory issues per family.  Try Valerian root for anxiety, insomnia Try Hydroxyzine prn - low dose Occasional insomnia and anxiety.  We talked about occasional Xanax use at a low-dose.  Insomnia and sleep deprivation may cause memory problems as well.  Steward DroneBrenda will try to do without Xanax and see if this is helpful or not.

## 2023-02-07 NOTE — Patient Instructions (Addendum)
Try Valerian root for anxiety, insomnia Sleepytime tea

## 2023-02-07 NOTE — Assessment & Plan Note (Addendum)
Chronic insomnia, anxiety. Pt stopped Xanax due to alledged mild memory issues per family.  Try Valerian root for anxiety, insomnia Try Hydroxyzine prn - low dose  Occasional insomnia and anxiety.  We talked about occasional Xanax use at a low-dose.  Insomnia and sleep deprivation may cause memory problems as well.  Steward DroneBrenda will try to do without Xanax and see if this is helpful or not.

## 2023-02-07 NOTE — Progress Notes (Signed)
Subjective:  Patient ID: Shelley Bray, female    DOB: May 20, 1946  Age: 77 y.o. MRN: [id number]  CC: Memory Loss (Daughter noticed pt repeats her self, forgetting things)   HPI Shelley Bray presents for insomnia, anxiety. Pt stopped Xanax due to alledged mild memory issues per family.   Outpatient Medications Prior to Visit  Medication Sig Dispense Refill   ALPRAZolam (XANAX) 0.5 MG tablet TAKE 1 TABLET(0.5 MG) BY MOUTH AT BEDTIME AS NEEDED FOR ANXIETY OR SLEEP 90 tablet 3   amLODipine (NORVASC) 2.5 MG tablet TAKE 1 TABLET(2.5 MG) BY MOUTH DAILY 90 tablet 3   cephALEXin (KEFLEX) 500 MG capsule Take 1 capsule (500 mg total) by mouth 3 (three) times daily. 15 capsule 0   Cholecalciferol (VITAMIN D3) 2000 UNITS capsule Take 1 capsule (2,000 Units total) by mouth daily. (Patient taking differently: Take 2,000 Units by mouth 3 (three) times a week.) 100 capsule 3   galantamine (RAZADYNE) 12 MG tablet TAKE 1 TABLET BY MOUTH TWICE DAILY 180 tablet 3   hydrochlorothiazide (MICROZIDE) 12.5 MG capsule TAKE ONE CAPSULE BY MOUTH EVERY DAY 90 capsule 3   ibuprofen (ADVIL,MOTRIN) 600 MG tablet Take 1 tablet (600 mg total) by mouth every 8 (eight) hours as needed for headache. 60 tablet 1   Lysine 1000 MG TABS Take 1 tablet (1,000 mg total) by mouth daily. 90 tablet 3   Multiple Vitamin (MULTI-VITAMIN PO) Take 1 tablet by mouth every morning.      pantoprazole (PROTONIX) 40 MG tablet TAKE 1 TABLET(40 MG) BY MOUTH DAILY 90 tablet 3   Peppermint Oil (IBGARD PO) Take by mouth.     polyethylene glycol powder (GLYCOLAX/MIRALAX) powder Take 17 g by mouth 2 (two) times daily as needed. 500 g 3   Probiotic Product (ULTRAFLORA IMMUNE HEALTH) 170 MG CAPS TAKE (1) CAPSULE DAILY. 60 capsule PRN   traMADol (ULTRAM) 50 MG tablet TAKE 1 TO 2 TABLETS BY MOUTH TWICE DAILY AS NEEDED FOR PAIN 180 tablet 1   No facility-administered medications prior to visit.    ROS: Review of Systems  Constitutional:  Negative  for activity change, appetite change, chills, fatigue and unexpected weight change.  HENT:  Negative for congestion, mouth sores and sinus pressure.   Eyes:  Negative for visual disturbance.  Respiratory:  Negative for cough and chest tightness.   Gastrointestinal:  Negative for abdominal pain and nausea.  Genitourinary:  Negative for difficulty urinating, frequency and vaginal pain.  Musculoskeletal:  Negative for back pain and gait problem.  Skin:  Negative for pallor and rash.  Neurological:  Negative for dizziness, tremors, weakness, numbness and headaches.  Psychiatric/Behavioral:  Positive for decreased concentration and sleep disturbance. Negative for confusion. The patient is nervous/anxious.     Objective:  BP 138/80 (BP Location: Left Arm, Patient Position: Sitting, Cuff Size: Normal)   Pulse 80   Temp 98.1 F (36.7 C) (Oral)   Ht 5\' 7"  (1.702 m)   Wt 175 lb (79.4 kg)   SpO2 97%   BMI 27.41 kg/m   BP Readings from Last 3 Encounters:  02/07/23 138/80  11/16/22 134/80  08/16/22 138/68    Wt Readings from Last 3 Encounters:  02/07/23 175 lb (79.4 kg)  11/16/22 178 lb (80.7 kg)  08/16/22 175 lb 3.2 oz (79.5 kg)    Physical Exam Constitutional:      General: She is not in acute distress.    Appearance: She is well-developed.  HENT:  Head: Normocephalic.     Right Ear: External ear normal.     Left Ear: External ear normal.     Nose: Nose normal.  Eyes:     General:        Right eye: No discharge.        Left eye: No discharge.     Conjunctiva/sclera: Conjunctivae normal.     Pupils: Pupils are equal, round, and reactive to light.  Neck:     Thyroid: No thyromegaly.     Vascular: No JVD.     Trachea: No tracheal deviation.  Cardiovascular:     Rate and Rhythm: Normal rate and regular rhythm.     Heart sounds: Normal heart sounds.  Pulmonary:     Effort: No respiratory distress.     Breath sounds: No stridor. No wheezing.  Abdominal:     General:  Bowel sounds are normal. There is no distension.     Palpations: Abdomen is soft. There is no mass.     Tenderness: There is no abdominal tenderness. There is no guarding or rebound.  Musculoskeletal:        General: No tenderness.     Cervical back: Normal range of motion and neck supple. No rigidity.  Lymphadenopathy:     Cervical: No cervical adenopathy.  Skin:    Findings: No erythema or rash.  Neurological:     Mental Status: She is oriented to person, place, and time.     Cranial Nerves: No cranial nerve deficit.     Motor: No weakness or abnormal muscle tone.     Coordination: Coordination normal.     Deep Tendon Reflexes: Reflexes normal.  Psychiatric:        Mood and Affect: Mood normal.        Behavior: Behavior normal.        Thought Content: Thought content normal.        Judgment: Judgment normal.     Lab Results  Component Value Date   WBC 5.9 08/16/2022   HGB 12.2 08/16/2022   HCT 37.0 08/16/2022   PLT 267.0 08/16/2022   GLUCOSE 83 02/07/2023   CHOL 212 (H) 09/08/2020   TRIG 92.0 09/08/2020   HDL 66.30 09/08/2020   LDLDIRECT 152.9 10/14/2012   LDLCALC 127 (H) 09/08/2020   ALT 9 02/07/2023   AST 13 02/07/2023   NA 142 02/07/2023   K 3.9 02/07/2023   CL 105 02/07/2023   CREATININE 0.76 02/07/2023   BUN 12 02/07/2023   CO2 29 02/07/2023   TSH 1.05 08/16/2022   INR 1.1 (H) 03/17/2021    US Abdomen Limited RUQ (LIVER/GB)  Result Date: 05/19/2021 CLINICAL DATA:  Elevated alkaline phosphatase.  Chronic fatigue. EXAM: ULTRASOUND ABDOMEN LIMITED RIGHT UPPER QUADRANT COMPARISON:  CT abdomen 07/31/2018 FINDINGS: Gallbladder: No gallstones or wall thickening visualized. No sonographic Murphy sign noted by sonographer. Common bile duct: Diameter: 0.5 cm, within normal limits Liver: Small hepatic cysts as shown on CT scan of 07/31/2018. Echogenicity in the upper normal range. Portal vein is patent on color Doppler imaging with normal direction of blood flow towards  the liver. Other: None. IMPRESSION: 1. Small hepatic cysts. 2. No biliary dilatation or gallbladder abnormality observed. 3. Hepatic echogenicity is in the upper normal range but not overtly accentuated. Electronically Signed   By: Gaylyn Rong M.D.   On: 05/19/2021 10:42    Assessment & Plan:   Problem List Items Addressed This Visit  Cardiovascular and Mediastinum   Essential hypertension    Blood pressure is well-controlled.  Continue with HCTZ and Norvasc      Relevant Orders   Comprehensive metabolic panel   Comp Met (CMET) (Completed)     Other   Anxiety disorder    Chronic insomnia, anxiety. Pt stopped Xanax due to alledged mild memory issues per family.  Try Valerian root for anxiety, insomnia Try Hydroxyzine prn - low dose Occasional insomnia and anxiety.  We talked about occasional Xanax use at a low-dose.  Insomnia and sleep deprivation may cause memory problems as well.  Jeni will try to do without Xanax and see if this is helpful or not.      Relevant Medications   hydrOXYzine (ATARAX) 25 MG tablet   Insomnia    Occasional insomnia and anxiety.  We talked about occasional Xanax use at a low-dose.  Insomnia and sleep deprivation may cause memory problems as well.  Dayja will try to do without Xanax and see if this is helpful or not.      Memory problem - Primary    Chronic insomnia, anxiety. Pt stopped Xanax due to alledged mild memory issues per family.  Try Valerian root for anxiety, insomnia Try Hydroxyzine prn - low dose  Occasional insomnia and anxiety.  We talked about occasional Xanax use at a low-dose.  Insomnia and sleep deprivation may cause memory problems as well.  Latecia will try to do without Xanax and see if this is helpful or not.      Relevant Orders   Comp Met (CMET) (Completed)   Other Visit Diagnoses     Cystitis             Meds ordered this encounter  Medications   hydrOXYzine (ATARAX) 25 MG tablet    Sig: Take 0.5-1  tablets (12.5-25 mg total) by mouth at bedtime as needed for anxiety (insomnia).    Dispense:  30 tablet    Refill:  2      Follow-up: Return in about 3 months (around 05/09/2023) for a follow-up visit.  Sonda Primes, MD

## 2023-02-11 DIAGNOSIS — G4700 Insomnia, unspecified: Secondary | ICD-10-CM | POA: Insufficient documentation

## 2023-02-11 NOTE — Assessment & Plan Note (Signed)
Occasional insomnia and anxiety.  We talked about occasional Xanax use at a low-dose.  Insomnia and sleep deprivation may cause memory problems as well.  Briseidy will try to do without Xanax and see if this is helpful or not.

## 2023-02-11 NOTE — Assessment & Plan Note (Signed)
Blood pressure is well-controlled.  Continue with HCTZ and Norvasc

## 2023-02-13 ENCOUNTER — Ambulatory Visit: Payer: Medicare Other | Admitting: Internal Medicine

## 2023-03-15 ENCOUNTER — Encounter: Payer: Self-pay | Admitting: Internal Medicine

## 2023-03-15 ENCOUNTER — Ambulatory Visit (INDEPENDENT_AMBULATORY_CARE_PROVIDER_SITE_OTHER): Payer: Medicare Other | Admitting: Internal Medicine

## 2023-03-15 VITALS — BP 129/75 | HR 87 | Temp 98.9°F | Ht 67.0 in | Wt 177.0 lb

## 2023-03-15 DIAGNOSIS — I10 Essential (primary) hypertension: Secondary | ICD-10-CM | POA: Diagnosis not present

## 2023-03-15 DIAGNOSIS — R5382 Chronic fatigue, unspecified: Secondary | ICD-10-CM

## 2023-03-15 DIAGNOSIS — F419 Anxiety disorder, unspecified: Secondary | ICD-10-CM | POA: Diagnosis not present

## 2023-03-15 MED ORDER — METHYLPHENIDATE HCL 5 MG PO TABS: 5.0000 mg | ORAL_TABLET | Freq: Two times a day (BID) | ORAL | 0 refills | Status: DC

## 2023-03-15 MED ORDER — ALPRAZOLAM 0.5 MG PO TABS: ORAL_TABLET | ORAL | 1 refills | Status: DC

## 2023-03-15 NOTE — Assessment & Plan Note (Addendum)
CFS discussed Re-start low dose Ritalin  Potential benefits of a long term amphetamines  use as well as potential risks  and complications were explained to the patient and were aknowledged. CFS

## 2023-03-15 NOTE — Progress Notes (Signed)
Subjective:  Patient ID: Shelley Bray, female    DOB: 08-04-1946  Age: 77 y.o. MRN: [id number]  CC: No chief complaint on file.   HPI Shelley Bray presents for anxiety, HTN, memory issues - mild, anxiety - worse  Outpatient Medications Prior to Visit  Medication Sig Dispense Refill   amLODipine (NORVASC) 2.5 MG tablet TAKE 1 TABLET(2.5 MG) BY MOUTH DAILY 90 tablet 3   cephALEXin (KEFLEX) 500 MG capsule Take 1 capsule (500 mg total) by mouth 3 (three) times daily. 15 capsule 0   Cholecalciferol (VITAMIN D3) 2000 UNITS capsule Take 1 capsule (2,000 Units total) by mouth daily. (Patient taking differently: Take 2,000 Units by mouth 3 (three) times a week.) 100 capsule 3   galantamine (RAZADYNE) 12 MG tablet TAKE 1 TABLET BY MOUTH TWICE DAILY 180 tablet 3   hydrochlorothiazide (MICROZIDE) 12.5 MG capsule TAKE ONE CAPSULE BY MOUTH EVERY DAY 90 capsule 3   hydrOXYzine (ATARAX) 25 MG tablet Take 0.5-1 tablets (12.5-25 mg total) by mouth at bedtime as needed for anxiety (insomnia). 30 tablet 2   ibuprofen (ADVIL,MOTRIN) 600 MG tablet Take 1 tablet (600 mg total) by mouth every 8 (eight) hours as needed for headache. 60 tablet 1   Lysine 1000 MG TABS Take 1 tablet (1,000 mg total) by mouth daily. 90 tablet 3   Multiple Vitamin (MULTI-VITAMIN PO) Take 1 tablet by mouth every morning.      pantoprazole (PROTONIX) 40 MG tablet TAKE 1 TABLET(40 MG) BY MOUTH DAILY 90 tablet 3   Peppermint Oil (IBGARD PO) Take by mouth.     polyethylene glycol powder (GLYCOLAX/MIRALAX) powder Take 17 g by mouth 2 (two) times daily as needed. 500 g 3   Probiotic Product (ULTRAFLORA IMMUNE HEALTH) 170 MG CAPS TAKE (1) CAPSULE DAILY. 60 capsule PRN   traMADol (ULTRAM) 50 MG tablet TAKE 1 TO 2 TABLETS BY MOUTH TWICE DAILY AS NEEDED FOR PAIN 180 tablet 1   ALPRAZolam (XANAX) 0.5 MG tablet TAKE 1 TABLET(0.5 MG) BY MOUTH AT BEDTIME AS NEEDED FOR ANXIETY OR SLEEP 90 tablet 3   No facility-administered medications prior to  visit.    ROS: Review of Systems  Constitutional:  Negative for activity change, appetite change, chills, fatigue and unexpected weight change.  HENT:  Negative for congestion, mouth sores and sinus pressure.   Eyes:  Negative for visual disturbance.  Respiratory:  Negative for cough and chest tightness.   Cardiovascular:  Negative for chest pain.  Gastrointestinal:  Negative for abdominal pain and nausea.  Genitourinary:  Negative for difficulty urinating, frequency and vaginal pain.  Musculoskeletal:  Negative for back pain and gait problem.  Skin:  Negative for pallor and rash.  Neurological:  Negative for dizziness, tremors, weakness, numbness and headaches.  Psychiatric/Behavioral:  Negative for confusion, dysphoric mood, sleep disturbance and suicidal ideas. The patient is nervous/anxious.     Objective:  BP 129/75   Pulse 87   Temp 98.9 F (37.2 C) (Oral)   Ht 5\' 7"  (1.702 m)   Wt 177 lb (80.3 kg)   SpO2 95%   BMI 27.72 kg/m   BP Readings from Last 3 Encounters:  03/15/23 129/75  02/07/23 138/80  11/16/22 134/80    Wt Readings from Last 3 Encounters:  03/15/23 177 lb (80.3 kg)  02/07/23 175 lb (79.4 kg)  11/16/22 178 lb (80.7 kg)    Physical Exam Constitutional:      General: She is not in acute distress.  Appearance: Normal appearance. She is well-developed.  HENT:     Head: Normocephalic.     Right Ear: External ear normal.     Left Ear: External ear normal.     Nose: Nose normal.  Eyes:     General:        Right eye: No discharge.        Left eye: No discharge.     Conjunctiva/sclera: Conjunctivae normal.     Pupils: Pupils are equal, round, and reactive to light.  Neck:     Thyroid: No thyromegaly.     Vascular: No JVD.     Trachea: No tracheal deviation.  Cardiovascular:     Rate and Rhythm: Normal rate and regular rhythm.     Heart sounds: Normal heart sounds.  Pulmonary:     Effort: No respiratory distress.     Breath sounds: No  stridor. No wheezing.  Abdominal:     General: Bowel sounds are normal. There is no distension.     Palpations: Abdomen is soft. There is no mass.     Tenderness: There is no abdominal tenderness. There is no guarding or rebound.  Musculoskeletal:        General: No tenderness.     Cervical back: Normal range of motion and neck supple. No rigidity.  Lymphadenopathy:     Cervical: No cervical adenopathy.  Skin:    Findings: No erythema or rash.  Neurological:     Mental Status: She is oriented to person, place, and time.     Cranial Nerves: No cranial nerve deficit.     Motor: No abnormal muscle tone.     Coordination: Coordination normal.     Deep Tendon Reflexes: Reflexes normal.  Psychiatric:        Behavior: Behavior normal.        Thought Content: Thought content normal.        Judgment: Judgment normal.     Lab Results  Component Value Date   WBC 5.9 08/16/2022   HGB 12.2 08/16/2022   HCT 37.0 08/16/2022   PLT 267.0 08/16/2022   GLUCOSE 83 02/07/2023   CHOL 212 (H) 09/08/2020   TRIG 92.0 09/08/2020   HDL 66.30 09/08/2020   LDLDIRECT 152.9 10/14/2012   LDLCALC 127 (H) 09/08/2020   ALT 9 02/07/2023   AST 13 02/07/2023   NA 142 02/07/2023   K 3.9 02/07/2023   CL 105 02/07/2023   CREATININE 0.76 02/07/2023   BUN 12 02/07/2023   CO2 29 02/07/2023   TSH 1.05 08/16/2022   INR 1.1 (H) 03/17/2021    US Abdomen Limited RUQ (LIVER/GB)  Result Date: 05/19/2021 CLINICAL DATA:  Elevated alkaline phosphatase.  Chronic fatigue. EXAM: ULTRASOUND ABDOMEN LIMITED RIGHT UPPER QUADRANT COMPARISON:  CT abdomen 07/31/2018 FINDINGS: Gallbladder: No gallstones or wall thickening visualized. No sonographic Murphy sign noted by sonographer. Common bile duct: Diameter: 0.5 cm, within normal limits Liver: Small hepatic cysts as shown on CT scan of 07/31/2018. Echogenicity in the upper normal range. Portal vein is patent on color Doppler imaging with normal direction of blood flow towards  the liver. Other: None. IMPRESSION: 1. Small hepatic cysts. 2. No biliary dilatation or gallbladder abnormality observed. 3. Hepatic echogenicity is in the upper normal range but not overtly accentuated. Electronically Signed   By: Gaylyn Rong M.D.   On: 05/19/2021 10:42    Assessment & Plan:   Problem List Items Addressed This Visit     Anxiety disorder  Xanax prn - infrequent  Potential benefits of a long term benzodiazepines  use as well as potential risks  and complications were explained to the patient and were aknowledged.      Relevant Medications   ALPRAZolam (XANAX) 0.5 MG tablet   Essential hypertension - Primary    Blood pressure is well-controlled.   Continue with HCTZ and NorvascWalking 5000 -7000 steps a day      Chronic fatigue    CFS discussed Re-start low dose Ritalin  Potential benefits of a long term amphetamines  use as well as potential risks  and complications were explained to the patient and were aknowledged. CFS         Meds ordered this encounter  Medications   ALPRAZolam (XANAX) 0.5 MG tablet    Sig: TAKE 1 TABLET(0.5 MG) BY MOUTH AT BEDTIME AS NEEDED FOR ANXIETY OR SLEEP    Dispense:  90 tablet    Refill:  1   methylphenidate (RITALIN) 5 MG tablet    Sig: Take 1 tablet (5 mg total) by mouth 2 (two) times daily with breakfast and lunch.    Dispense:  60 tablet    Refill:  0      Follow-up: Return in about 3 months (around 06/15/2023) for a follow-up visit.  Sonda Primes, MD

## 2023-03-15 NOTE — Assessment & Plan Note (Addendum)
Blood pressure is well-controlled.   Continue with HCTZ and NorvascWalking 5000 -7000 steps a day

## 2023-03-15 NOTE — Assessment & Plan Note (Signed)
  Xanax prn - infrequent  Potential benefits of a long term benzodiazepines  use as well as potential risks  and complications were explained to the patient and were aknowledged.

## 2023-05-09 ENCOUNTER — Ambulatory Visit (INDEPENDENT_AMBULATORY_CARE_PROVIDER_SITE_OTHER): Payer: Medicare Other

## 2023-05-09 ENCOUNTER — Other Ambulatory Visit (INDEPENDENT_AMBULATORY_CARE_PROVIDER_SITE_OTHER): Payer: Medicare Other

## 2023-05-09 VITALS — BP 128/80 | HR 66 | Temp 97.8°F | Ht 67.0 in | Wt 178.4 lb

## 2023-05-09 DIAGNOSIS — I10 Essential (primary) hypertension: Secondary | ICD-10-CM | POA: Diagnosis not present

## 2023-05-09 DIAGNOSIS — Z0000 Encounter for general adult medical examination without abnormal findings: Secondary | ICD-10-CM | POA: Diagnosis not present

## 2023-05-09 DIAGNOSIS — Z23 Encounter for immunization: Secondary | ICD-10-CM

## 2023-05-09 LAB — COMPREHENSIVE METABOLIC PANEL
ALT: 11 U/L (ref 0–35)
AST: 16 U/L (ref 0–37)
Albumin: 4.1 g/dL (ref 3.5–5.2)
Alkaline Phosphatase: 122 U/L — ABNORMAL HIGH (ref 39–117)
BUN: 12 mg/dL (ref 6–23)
CO2: 28 mEq/L (ref 19–32)
Calcium: 9.5 mg/dL (ref 8.4–10.5)
Chloride: 106 mEq/L (ref 96–112)
Creatinine, Ser: 0.73 mg/dL (ref 0.40–1.20)
GFR: 78.58 mL/min (ref >60.00)
Glucose, Bld: 87 mg/dL (ref 70–99)
Potassium: 3.7 mEq/L (ref 3.5–5.1)
Sodium: 141 mEq/L (ref 135–145)
Total Bilirubin: 0.5 mg/dL (ref 0.2–1.2)
Total Protein: 7.2 g/dL (ref 6.0–8.3)

## 2023-05-09 NOTE — Patient Instructions (Signed)
Ms. Heimberger , Thank you for taking time to come for your Medicare Wellness Visit. I appreciate your ongoing commitment to your health goals. Please review the following plan we discussed and let me know if I can assist you in the future.   These are the goals we discussed:  Goals      Lose weight.  My weight goal is to be 170 lbs.     Continue to exercise, eat healthy, and enjoy life        This is a list of the screening recommended for you and due dates:  Health Maintenance  Topic Date Due   Hepatitis C Screening  Never done   DTaP/Tdap/Td vaccine (1 - Tdap) Never done   DEXA scan (bone density measurement)  Never done   Pneumonia Vaccine (2 of 2 - PPSV23 or PCV20) 02/27/2017   Medicare Annual Wellness Visit  05/02/2023   COVID-19 Vaccine (6 - 2023-24 season) 06/27/2023*   Flu Shot  06/07/2023   HPV Vaccine  Aged Out   Zoster (Shingles) Vaccine  Discontinued  *Topic was postponed. The date shown is not the original due date.    Advanced directives: Yes  Conditions/risks identified: Yes  Next appointment: Follow up in one year for your annual wellness visit.   Preventive Care 38 Years and Older, Female Preventive care refers to lifestyle choices and visits with your health care provider that can promote health and wellness. What does preventive care include? A yearly physical exam. This is also called an annual well check. Dental exams once or twice a year. Routine eye exams. Ask your health care provider how often you should have your eyes checked. Personal lifestyle choices, including: Daily care of your teeth and gums. Regular physical activity. Eating a healthy diet. Avoiding tobacco and drug use. Limiting alcohol use. Practicing safe sex. Taking low-dose aspirin every day. Taking vitamin and mineral supplements as recommended by your health care provider. What happens during an annual well check? The services and screenings done by your health care provider  during your annual well check will depend on your age, overall health, lifestyle risk factors, and family history of disease. Counseling  Your health care provider may ask you questions about your: Alcohol use. Tobacco use. Drug use. Emotional well-being. Home and relationship well-being. Sexual activity. Eating habits. History of falls. Memory and ability to understand (cognition). Work and work Astronomer. Reproductive health. Screening  You may have the following tests or measurements: Height, weight, and BMI. Blood pressure. Lipid and cholesterol levels. These may be checked every 5 years, or more frequently if you are over 43 years old. Skin check. Lung cancer screening. You may have this screening every year starting at age 79 if you have a 30-pack-year history of smoking and currently smoke or have quit within the past 15 years. Fecal occult blood test (FOBT) of the stool. You may have this test every year starting at age 79. Flexible sigmoidoscopy or colonoscopy. You may have a sigmoidoscopy every 5 years or a colonoscopy every 10 years starting at age 79. Hepatitis C blood test. Hepatitis B blood test. Sexually transmitted disease (STD) testing. Diabetes screening. This is done by checking your blood sugar (glucose) after you have not eaten for a while (fasting). You may have this done every 1-3 years. Bone density scan. This is done to screen for osteoporosis. You may have this done starting at age 79. Mammogram. This may be done every 1-2 years. Talk to your health  care provider about how often you should have regular mammograms. Talk with your health care provider about your test results, treatment options, and if necessary, the need for more tests. Vaccines  Your health care provider may recommend certain vaccines, such as: Influenza vaccine. This is recommended every year. Tetanus, diphtheria, and acellular pertussis (Tdap, Td) vaccine. You may need a Td booster every  10 years. Zoster vaccine. You may need this after age 79. Pneumococcal 13-valent conjugate (PCV13) vaccine. One dose is recommended after age 79. Pneumococcal polysaccharide (PPSV23) vaccine. One dose is recommended after age 79. Talk to your health care provider about which screenings and vaccines you need and how often you need them. This information is not intended to replace advice given to you by your health care provider. Make sure you discuss any questions you have with your health care provider. Document Released: 11/19/2015 Document Revised: 07/12/2016 Document Reviewed: 08/24/2015 Elsevier Interactive Patient Education  2017 [employer] Prevention in the Home Falls can cause injuries. They can happen to people of all ages. There are many things you can do to make your home safe and to help prevent falls. What can I do on the outside of my home? Regularly fix the edges of walkways and driveways and fix any cracks. Remove anything that might make you trip as you walk through a door, such as a raised step or threshold. Trim any bushes or trees on the path to your home. Use bright outdoor lighting. Clear any walking paths of anything that might make someone trip, such as rocks or tools. Regularly check to see if handrails are loose or broken. Make sure that both sides of any steps have handrails. Any raised decks and porches should have guardrails on the edges. Have any leaves, snow, or ice cleared regularly. Use sand or salt on walking paths during winter. Clean up any spills in your garage right away. This includes oil or grease spills. What can I do in the bathroom? Use night lights. Install grab bars by the toilet and in the tub and shower. Do not use towel bars as grab bars. Use non-skid mats or decals in the tub or shower. If you need to sit down in the shower, use a plastic, non-slip stool. Keep the floor dry. Clean up any water that spills on the floor as soon as it  happens. Remove soap buildup in the tub or shower regularly. Attach bath mats securely with double-sided non-slip rug tape. Do not have throw rugs and other things on the floor that can make you trip. What can I do in the bedroom? Use night lights. Make sure that you have a light by your bed that is easy to reach. Do not use any sheets or blankets that are too big for your bed. They should not hang down onto the floor. Have a firm chair that has side arms. You can use this for support while you get dressed. Do not have throw rugs and other things on the floor that can make you trip. What can I do in the kitchen? Clean up any spills right away. Avoid walking on wet floors. Keep items that you use a lot in easy-to-reach places. If you need to reach something above you, use a strong step stool that has a grab bar. Keep electrical cords out of the way. Do not use floor polish or wax that makes floors slippery. If you must use wax, use non-skid floor wax. Do not have throw  rugs and other things on the floor that can make you trip. What can I do with my stairs? Do not leave any items on the stairs. Make sure that there are handrails on both sides of the stairs and use them. Fix handrails that are broken or loose. Make sure that handrails are as long as the stairways. Check any carpeting to make sure that it is firmly attached to the stairs. Fix any carpet that is loose or worn. Avoid having throw rugs at the top or bottom of the stairs. If you do have throw rugs, attach them to the floor with carpet tape. Make sure that you have a light switch at the top of the stairs and the bottom of the stairs. If you do not have them, ask someone to add them for you. What else can I do to help prevent falls? Wear shoes that: Do not have high heels. Have rubber bottoms. Are comfortable and fit you well. Are closed at the toe. Do not wear sandals. If you use a stepladder: Make sure that it is fully opened.  Do not climb a closed stepladder. Make sure that both sides of the stepladder are locked into place. Ask someone to hold it for you, if possible. Clearly mark and make sure that you can see: Any grab bars or handrails. First and last steps. Where the edge of each step is. Use tools that help you move around (mobility aids) if they are needed. These include: Canes. Walkers. Scooters. Crutches. Turn on the lights when you go into a dark area. Replace any light bulbs as soon as they burn out. Set up your furniture so you have a clear path. Avoid moving your furniture around. If any of your floors are uneven, fix them. If there are any pets around you, be aware of where they are. Review your medicines with your doctor. Some medicines can make you feel dizzy. This can increase your chance of falling. Ask your doctor what other things that you can do to help prevent falls. This information is not intended to replace advice given to you by your health care provider. Make sure you discuss any questions you have with your health care provider. Document Released: 08/19/2009 Document Revised: 03/30/2016 Document Reviewed: 11/27/2014 Elsevier Interactive Patient Education  2017 [employer].

## 2023-05-09 NOTE — Progress Notes (Addendum)
Subjective:   Shelley Bray is a 77 y.o. female who presents for Medicare Annual (Subsequent) preventive examination.  Visit Complete: In person  Patient Medicare AWV questionnaire was completed by the patient on 05/07/2023; I have confirmed that all information answered by patient is correct and no changes since this date.  Review of Systems     Cardiac Risk Factors include: advanced age (>48men, >76 women);family history of premature cardiovascular disease;hypertension     Objective:    Today's Vitals   05/09/23 0920 05/09/23 0924  BP: 128/80   Pulse: 66   Temp: 97.8 F (36.6 C)   TempSrc: Temporal   SpO2: 98%   Weight: 178 lb 6.4 oz (80.9 kg)   Height: 5\' 7"  (1.702 m)   PainSc: 0-No pain 0-No pain   Body mass index is 27.94 kg/m.     05/09/2023    9:25 AM 05/01/2022    1:20 PM 02/28/2017   10:23 AM 09/09/2016   12:46 AM 02/28/2016    1:57 PM  Advanced Directives  Does Patient Have a Medical Advance Directive? Yes Yes No No Yes  Type of Estate agent of [place];Living will Living will;Healthcare Power of Attorney     Does patient want to make changes to medical advance directive?  No - Patient declined     Copy of Healthcare Power of Attorney in Chart? No - copy requested No - copy requested   No - copy requested  Would patient like information on creating a medical advance directive?   Yes (ED - Information included in AVS) No - patient declined information     Current Medications (verified) Outpatient Encounter Medications as of 05/09/2023  Medication Sig   ALPRAZolam (XANAX) 0.5 MG tablet TAKE 1 TABLET(0.5 MG) BY MOUTH AT BEDTIME AS NEEDED FOR ANXIETY OR SLEEP   amLODipine (NORVASC) 2.5 MG tablet TAKE 1 TABLET(2.5 MG) BY MOUTH DAILY   cephALEXin (KEFLEX) 500 MG capsule Take 1 capsule (500 mg total) by mouth 3 (three) times daily.   Cholecalciferol (VITAMIN D3) 2000 UNITS capsule Take 1 capsule (2,000 Units total) by mouth daily. (Patient taking  differently: Take 2,000 Units by mouth 3 (three) times a week.)   galantamine (RAZADYNE) 12 MG tablet TAKE 1 TABLET BY MOUTH TWICE DAILY   hydrochlorothiazide (MICROZIDE) 12.5 MG capsule TAKE ONE CAPSULE BY MOUTH EVERY DAY   hydrOXYzine (ATARAX) 25 MG tablet Take 0.5-1 tablets (12.5-25 mg total) by mouth at bedtime as needed for anxiety (insomnia).   ibuprofen (ADVIL,MOTRIN) 600 MG tablet Take 1 tablet (600 mg total) by mouth every 8 (eight) hours as needed for headache.   Lysine 1000 MG TABS Take 1 tablet (1,000 mg total) by mouth daily.   methylphenidate (RITALIN) 5 MG tablet Take 1 tablet (5 mg total) by mouth 2 (two) times daily with breakfast and lunch.   Multiple Vitamin (MULTI-VITAMIN PO) Take 1 tablet by mouth every morning.    pantoprazole (PROTONIX) 40 MG tablet TAKE 1 TABLET(40 MG) BY MOUTH DAILY   Peppermint Oil (IBGARD PO) Take by mouth.   polyethylene glycol powder (GLYCOLAX/MIRALAX) powder Take 17 g by mouth 2 (two) times daily as needed.   Probiotic Product (ULTRAFLORA IMMUNE HEALTH) 170 MG CAPS TAKE (1) CAPSULE DAILY.   traMADol (ULTRAM) 50 MG tablet TAKE 1 TO 2 TABLETS BY MOUTH TWICE DAILY AS NEEDED FOR PAIN   No facility-administered encounter medications on file as of 05/09/2023.    Allergies (verified) Crestor [rosuvastatin calcium], Other, and  Shellfish allergy   History: Past Medical History:  Diagnosis Date   ANXIETY    GERD    HYPERLIPIDEMIA    HYPERTENSION    Memory loss    after son died in  2001-06-03   SHELLFISH ALLERGY    Past Surgical History:  Procedure Laterality Date   TUBAL LIGATION     Family History  Problem Relation Age of Onset   Heart attack Father 48   Breast cancer Sister    Cervical cancer Sister    Ovarian cancer Sister    Heart attack Brother 65   Social History   Socioeconomic History   Marital status: Married    Spouse name: Not on file   Number of children: 2   Years of education: Not on file   Highest education level:  Bachelor's degree (e.g., BA, AB, BS)  Occupational History   Occupation: Retired Technical sales engineer  Tobacco Use   Smoking status: Never   Smokeless tobacco: Never  Substance and Sexual Activity   Alcohol use: No    Alcohol/week: 0.0 standard drinks of alcohol   Drug use: No   Sexual activity: Not on file  Other Topics Concern   Not on file  Social History Narrative   Not on file   Social Determinants of Health   Financial Resource Strain: Low Risk  (05/09/2023)   Overall Financial Resource Strain (CARDIA)    Difficulty of Paying Living Expenses: Not hard at all  Food Insecurity: No Food Insecurity (05/09/2023)   Hunger Vital Sign    Worried About Running Out of Food in the Last Year: Never true    Ran Out of Food in the Last Year: Never true  Transportation Needs: No Transportation Needs (05/09/2023)   PRAPARE - Administrator, Civil Service (Medical): No    Lack of Transportation (Non-Medical): No  Physical Activity: Sufficiently Active (05/09/2023)   Exercise Vital Sign    Days of Exercise per Week: 5 days    Minutes of Exercise per Session: 30 min  Stress: No Stress Concern Present (05/09/2023)   [employer] of Occupational Health - Occupational Stress Questionnaire    Feeling of Stress : Only a little  Social Connections: Socially Integrated (05/09/2023)   Social Connection and Isolation Panel [NHANES]    Frequency of Communication with Friends and Family: More than three times a week    Frequency of Social Gatherings with Friends and Family: Three times a week    Attends Religious Services: More than 4 times per year    Active Member of Clubs or Organizations: Yes    Attends Engineer, structural: More than 4 times per year    Marital Status: Married    Tobacco Counseling Counseling given: Not Answered   Clinical Intake:  Pre-visit preparation completed: Yes  Pain : No/denies pain Pain Score: 0-No pain     BMI - recorded: 27.94 Nutritional  Status: BMI 25 -29 Overweight Nutritional Risks: None Diabetes: No  How often do you need to have someone help you when you read instructions, pamphlets, or other written materials from your doctor or pharmacy?: 1 - Never What is the last grade level you completed in school?: Graduate School  Interpreter Needed?: No  Information entered by :: Kayline Sheer N. Weslie Pretlow, LPN.   Activities of Daily Living    05/09/2023    9:45 AM 05/07/2023    6:23 AM  In your present state of health, do you have any difficulty  performing the following activities:  Hearing? 0 0  Vision? 0 0  Difficulty concentrating or making decisions? 0 0  Walking or climbing stairs? 0 0  Dressing or bathing? 0 0  Doing errands, shopping? 0 0  Preparing Food and eating ? N N  Using the Toilet? N N  In the past six months, have you accidently leaked urine? N N  Do you have problems with loss of bowel control? N N  Managing your Medications? N N  Managing your Finances? N N  Housekeeping or managing your Housekeeping? N N    Patient Care Team: Plotnikov, Georgina Quint, MD as PCP - General (Internal Medicine) Charna Elizabeth, MD as Attending Physician (Gastroenterology) Kathleene Hazel, MD as Attending Physician (Cardiology) Richardean Chimera, MD as Consulting Physician (Obstetrics and Gynecology) [facility] as Consulting Physician (Optometry)  Indicate any recent Medical Services you may have received from other than [facility] providers in the past year (date may be approximate).     Assessment:   This is a routine wellness examination for Shelley Bray.  Hearing/Vision screen Hearing Screening - Comments:: Denies hearing difficulties   Vision Screening - Comments:: Wears rx glasses - up to date with routine eye exams with [facility]   Dietary issues and exercise activities discussed:     Goals Addressed             This Visit's Progress    Lose weight.  My weight goal is to be 170  lbs.       Continue to exercise, eat healthy, and enjoy life      Depression Screen    05/09/2023    9:27 AM 03/15/2023   10:37 AM 02/07/2023   10:14 AM 11/16/2022   10:24 AM 05/01/2022    1:19 PM 03/08/2021    8:29 AM 03/05/2018    9:02 AM  PHQ 2/9 Scores  PHQ - 2 Score 0 0 0 0 0 0 0  PHQ- 9 Score 0     0     Fall Risk    05/09/2023    9:27 AM 05/07/2023    6:23 AM 04/29/2023    1:43 PM 03/15/2023   10:37 AM 02/07/2023   10:14 AM  Fall Risk   Falls in the past year? 0 0 0 0 0  Number falls in past yr: 0   0 0  Injury with Fall? 0   0 0  Risk for fall due to : No Fall Risks   No Fall Risks No Fall Risks  Follow up Falls prevention discussed   Falls evaluation completed Falls evaluation completed    MEDICARE RISK AT HOME:  Medicare Risk at Home - 05/09/23 0946     Any stairs in or around the home? Yes    If so, are there any without handrails? No    Home free of loose throw rugs in walkways, pet beds, electrical cords, etc? Yes    Adequate lighting in your home to reduce risk of falls? Yes    Life alert? No    Use of a cane, walker or w/c? No    Grab bars in the bathroom? No    Shower chair or bench in shower? No    Elevated toilet seat or a handicapped toilet? No             TIMED UP AND GO:  Was the test performed?  Yes  Length of time to ambulate 10 feet: 8 sec  Gait steady and fast without use of assistive device    Cognitive Function:        05/09/2023    9:28 AM 05/01/2022    1:24 PM  6CIT Screen  What Year? 0 points 0 points  What month? 0 points 0 points  What time? 0 points 0 points  Count back from 20 0 points 0 points  Months in reverse 0 points 0 points  Repeat phrase 0 points 0 points  Total Score 0 points 0 points    Immunizations Immunization History  Administered Date(s) Administered   Fluad Quad(high Dose 65+) 07/29/2019   Influenza, High Dose Seasonal PF 07/31/2014, 08/02/2015, 07/31/2017, 08/17/2020, 08/06/2022   Influenza, Seasonal,  Injecte, Preservative Fre 07/29/2019   Influenza,inj,Quad PF,6+ Mos 07/21/2016   Influenza-Unspecified 08/13/2013, 07/18/2018, 08/12/2021   Moderna SARS-COV2 Booster Vaccination 09/14/2020, 05/24/2021   Moderna Sars-Covid-2 Vaccination 11/14/2019, 12/12/2019, 09/14/2020, 05/24/2021   PNEUMOCOCCAL CONJUGATE-20 05/09/2023   Pfizer Covid-19 Vaccine Bivalent Booster 16yrs & up 08/12/2021, 08/01/2022   Pneumococcal Conjugate-13 02/28/2016   Zoster, Live 01/17/2011    TDAP status: Due, Education has been provided regarding the importance of this vaccine. Advised may receive this vaccine at local pharmacy or Health Dept. Aware to provide a copy of the vaccination record if obtained from local pharmacy or Health Dept. Verbalized acceptance and understanding.  Flu Vaccine status: Up to date  Pneumococcal vaccine status: Up to date  Covid-19 vaccine status: Completed vaccines  Qualifies for Shingles Vaccine? Yes   Zostavax completed Yes   Shingrix Completed?: No.    Education has been provided regarding the importance of this vaccine. Patient has been advised to call insurance company to determine out of pocket expense if they have not yet received this vaccine. Advised may also receive vaccine at local pharmacy or Health Dept. Verbalized acceptance and understanding.  Screening Tests Health Maintenance  Topic Date Due   Hepatitis C Screening  Never done   DTaP/Tdap/Td (1 - Tdap) Never done   DEXA SCAN  Never done   COVID-19 Vaccine (9 - 2023-24 season) 06/27/2023 (Originally 09/26/2022)   INFLUENZA VACCINE  06/07/2023   Medicare Annual Wellness (AWV)  05/08/2024   Pneumonia Vaccine 38+ Years old  Completed   HPV VACCINES  Aged Out   Zoster Vaccines- Shingrix  Discontinued    Health Maintenance  Health Maintenance Due  Topic Date Due   Hepatitis C Screening  Never done   DTaP/Tdap/Td (1 - Tdap) Never done   DEXA SCAN  Never done    Colorectal cancer screening: No longer required.    Mammogram status: Completed 06/2022. Repeat every year  Bone Density status: Never done  Lung Cancer Screening: (Low Dose CT Chest recommended if Age 14-80 years, 20 pack-year currently smoking OR have quit w/in 15years.) does not qualify.   Lung Cancer Screening Referral: no  Additional Screening:  Hepatitis C Screening: does qualify; Completed: no  Vision Screening: Recommended annual ophthalmology exams for early detection of glaucoma and other disorders of the eye. Is the patient up to date with their annual eye exam?  Yes  Who is the provider or what is the name of the office in which the patient attends annual eye exams? [facility] If pt is not established with a provider, would they like to be referred to a provider to establish care? No .   Dental Screening: Recommended annual dental exams for proper oral hygiene  Diabetic Foot Exam: n/a  Community Resource Referral / Chronic  Care Management: CRR required this visit?  No   CCM required this visit?  No     Plan:     I have personally reviewed and noted the following in the patient's chart:   Medical and social history Use of alcohol, tobacco or illicit drugs  Current medications and supplements including opioid prescriptions. Patient is not currently taking opioid prescriptions. Functional ability and status Nutritional status Physical activity Advanced directives List of other physicians Hospitalizations, surgeries, and ER visits in previous 12 months Vitals Screenings to include cognitive, depression, and falls Referrals and appointments  In addition, I have reviewed and discussed with patient certain preventive protocols, quality metrics, and best practice recommendations. A written personalized care plan for preventive services as well as general preventive health recommendations were provided to patient.     Mickeal Needy, LPN   11/11/1094   After Visit Summary: Printed and given to  patient.  Nurse Notes: Normal cognitive status assessed by direct observation by this Nurse Health Advisor. No abnormalities found.     Medical screening examination/treatment/procedure(s) were performed by non-physician practitioner and as supervising physician I was immediately available for consultation/collaboration.  I agree with above. Jacinta Shoe, MD

## 2023-05-13 ENCOUNTER — Encounter: Payer: Self-pay | Admitting: Internal Medicine

## 2023-05-17 ENCOUNTER — Ambulatory Visit: Payer: Medicare Other | Admitting: Internal Medicine

## 2023-06-11 ENCOUNTER — Encounter: Payer: Medicare Other | Admitting: Plastic Surgery

## 2023-09-06 ENCOUNTER — Other Ambulatory Visit: Payer: Self-pay | Admitting: Internal Medicine

## 2023-09-17 ENCOUNTER — Other Ambulatory Visit: Payer: Self-pay | Admitting: Internal Medicine

## 2023-09-24 ENCOUNTER — Other Ambulatory Visit (HOSPITAL_COMMUNITY): Payer: Self-pay

## 2023-10-03 ENCOUNTER — Other Ambulatory Visit: Payer: Self-pay | Admitting: Internal Medicine

## 2023-10-03 ENCOUNTER — Ambulatory Visit: Payer: Medicare Other | Admitting: Internal Medicine

## 2023-10-03 ENCOUNTER — Encounter: Payer: Self-pay | Admitting: Internal Medicine

## 2023-10-03 VITALS — BP 120/82 | HR 82 | Temp 98.6°F | Ht 67.0 in | Wt 177.0 lb

## 2023-10-03 DIAGNOSIS — I10 Essential (primary) hypertension: Secondary | ICD-10-CM

## 2023-10-03 DIAGNOSIS — F5101 Primary insomnia: Secondary | ICD-10-CM

## 2023-10-03 DIAGNOSIS — R413 Other amnesia: Secondary | ICD-10-CM

## 2023-10-03 MED ORDER — MEMANTINE HCL 10 MG PO TABS
10.0000 mg | ORAL_TABLET | Freq: Two times a day (BID) | ORAL | 11 refills | Status: DC
Start: 2023-10-03 — End: 2024-08-20

## 2023-10-03 NOTE — Assessment & Plan Note (Signed)
Shelley Bray has been using alprazolam half a tablet at night to help her sleep.  She cannot sleep without it.  Potential benefits of a long term benzodiazepines  use as well as potential risks  and complications were explained to the patient and were aknowledged.

## 2023-10-03 NOTE — Assessment & Plan Note (Addendum)
Mild cognitive deficit.   There is concern that there are days when Merilyn would repeat herself often asking the same question over and over again.  Otherwise she is highly functional and deficient. She had a stressful day yesterday because one of the residents at their facility died unexpectedly.  She did not sleep well last night. She has been using alprazolam half a tablet at night to help her sleep.  She cannot sleep without it. No family history of Alzheimer's disease. The MMSE score today is 21/30.  I do not think it is valid due to the fact that Krysia is tired and anxious today. We added Namenda. Nerology consultation.

## 2023-10-03 NOTE — Assessment & Plan Note (Signed)
Blood pressure is well-controlled.   Continue with HCTZ and NorvascWalking 5000 -7000 steps a day

## 2023-10-03 NOTE — Progress Notes (Signed)
Subjective:  Patient ID: Shelley Bray, female    DOB: November 04, 1946  Age: 77 y.o. MRN: [id number]  CC: Memory Loss (Pt states Shelley Bray repeats herself more often now and just wants to check for memory cocnerns)   HPI Shelley Bray presents for a short term memory loss x months. Shelley Bray is here with her daughter.  They have a concern.    There is concern that there are days when Shelley Bray would repeat herself often asking the same question over and over again.  Otherwise Shelley Bray is highly functional and deficient. Shelley Bray had a stressful day yesterday because one of the residents at their facility died unexpectedly.  Shelley Bray did not sleep well last night. Shelley Bray has been using alprazolam half a tablet at night to help her sleep.  Shelley Bray cannot sleep without it.    Outpatient Medications Prior to Visit  Medication Sig Dispense Refill   ALPRAZolam (XANAX) 0.5 MG tablet TAKE 1 TABLET(0.5 MG) BY MOUTH AT BEDTIME AS NEEDED FOR ANXIETY OR SLEEP 90 tablet 1   amLODipine (NORVASC) 2.5 MG tablet TAKE 1 TABLET(2.5 MG) BY MOUTH DAILY 90 tablet 3   Cholecalciferol (VITAMIN D3) 2000 UNITS capsule Take 1 capsule (2,000 Units total) by mouth daily. (Patient taking differently: Take 2,000 Units by mouth 3 (three) times a week.) 100 capsule 3   hydrochlorothiazide (MICROZIDE) 12.5 MG capsule TAKE ONE CAPSULE BY MOUTH EVERY DAY 90 capsule 3   hydrOXYzine (ATARAX) 25 MG tablet Take 0.5-1 tablets (12.5-25 mg total) by mouth at bedtime as needed for anxiety (insomnia). 30 tablet 2   ibuprofen (ADVIL,MOTRIN) 600 MG tablet Take 1 tablet (600 mg total) by mouth every 8 (eight) hours as needed for headache. 60 tablet 1   Lysine 1000 MG TABS Take 1 tablet (1,000 mg total) by mouth daily. 90 tablet 3   Multiple Vitamin (MULTI-VITAMIN PO) Take 1 tablet by mouth every morning.      pantoprazole (PROTONIX) 40 MG tablet TAKE 1 TABLET(40 MG) BY MOUTH DAILY 90 tablet 3   Peppermint Oil (IBGARD PO) Take by mouth.     polyethylene glycol powder  (GLYCOLAX/MIRALAX) powder Take 17 g by mouth 2 (two) times daily as needed. 500 g 3   Probiotic Product (ULTRAFLORA IMMUNE HEALTH) 170 MG CAPS TAKE (1) CAPSULE DAILY. 60 capsule PRN   traMADol (ULTRAM) 50 MG tablet TAKE 1 TO 2 TABLETS BY MOUTH TWICE DAILY AS NEEDED FOR PAIN 180 tablet 1   galantamine (RAZADYNE) 12 MG tablet TAKE 1 TABLET BY MOUTH TWICE DAILY 180 tablet 3   cephALEXin (KEFLEX) 500 MG capsule Take 1 capsule (500 mg total) by mouth 3 (three) times daily. 15 capsule 0   methylphenidate (RITALIN) 5 MG tablet Take 1 tablet (5 mg total) by mouth 2 (two) times daily with breakfast and lunch. 60 tablet 0   No facility-administered medications prior to visit.    ROS: Review of Systems  Constitutional:  Negative for activity change, appetite change, chills, fatigue and unexpected weight change.  HENT:  Negative for congestion, mouth sores and sinus pressure.   Eyes:  Negative for visual disturbance.  Respiratory:  Negative for cough and chest tightness.   Gastrointestinal:  Negative for abdominal pain and nausea.  Genitourinary:  Negative for difficulty urinating, frequency and vaginal pain.  Musculoskeletal:  Negative for back pain and gait problem.  Skin:  Negative for pallor and rash.  Neurological:  Negative for dizziness, tremors, weakness, numbness and headaches.  Psychiatric/Behavioral:  Positive for decreased  concentration and sleep disturbance. Negative for confusion, dysphoric mood and suicidal ideas. The patient is nervous/anxious.     Objective:  BP 120/82 (BP Location: Left Arm, Patient Position: Sitting, Cuff Size: Normal)   Pulse 82   Temp 98.6 F (37 C) (Oral)   Ht 5\' 7"  (1.702 m)   Wt 177 lb (80.3 kg)   SpO2 97%   BMI 27.72 kg/m   BP Readings from Last 3 Encounters:  10/03/23 120/82  05/09/23 128/80  03/15/23 129/75    Wt Readings from Last 3 Encounters:  10/03/23 177 lb (80.3 kg)  05/09/23 178 lb 6.4 oz (80.9 kg)  03/15/23 177 lb (80.3 kg)     Physical Exam Constitutional:      General: Shelley Bray is not in acute distress.    Appearance: Normal appearance. Shelley Bray is well-developed.  HENT:     Head: Normocephalic.     Right Ear: External ear normal.     Left Ear: External ear normal.     Nose: Nose normal.  Eyes:     General:        Right eye: No discharge.        Left eye: No discharge.     Conjunctiva/sclera: Conjunctivae normal.     Pupils: Pupils are equal, round, and reactive to light.  Neck:     Thyroid: No thyromegaly.     Vascular: No JVD.     Trachea: No tracheal deviation.  Cardiovascular:     Rate and Rhythm: Normal rate and regular rhythm.     Heart sounds: Normal heart sounds.  Pulmonary:     Effort: No respiratory distress.     Breath sounds: No stridor. No wheezing.  Abdominal:     General: Bowel sounds are normal. There is no distension.     Palpations: Abdomen is soft. There is no mass.     Tenderness: There is no abdominal tenderness. There is no guarding or rebound.  Musculoskeletal:        General: No tenderness.     Cervical back: Normal range of motion and neck supple. No rigidity.  Lymphadenopathy:     Cervical: No cervical adenopathy.  Skin:    Findings: No erythema or rash.  Neurological:     Mental Status: Shelley Bray is oriented to person, place, and time.     Cranial Nerves: No cranial nerve deficit.     Motor: No abnormal muscle tone.     Coordination: Coordination normal.     Deep Tendon Reflexes: Reflexes normal.  Psychiatric:        Behavior: Behavior normal.        Thought Content: Thought content normal.        Judgment: Judgment normal.   Shelley Bray looks well, but tired Neuroexam is nonfocal  Lab Results  Component Value Date   WBC 5.9 08/16/2022   HGB 12.2 08/16/2022   HCT 37.0 08/16/2022   PLT 267.0 08/16/2022   GLUCOSE 87 05/09/2023   CHOL 212 (H) 09/08/2020   TRIG 92.0 09/08/2020   HDL 66.30 09/08/2020   LDLDIRECT 152.9 10/14/2012   LDLCALC 127 (H) 09/08/2020   ALT 11  05/09/2023   AST 16 05/09/2023   NA 141 05/09/2023   K 3.7 05/09/2023   CL 106 05/09/2023   CREATININE 0.73 05/09/2023   BUN 12 05/09/2023   CO2 28 05/09/2023   TSH 1.05 08/16/2022   INR 1.1 (H) 03/17/2021    US Abdomen Limited RUQ (LIVER/GB)  Result Date: 05/19/2021  CLINICAL DATA:  Elevated alkaline phosphatase.  Chronic fatigue. EXAM: ULTRASOUND ABDOMEN LIMITED RIGHT UPPER QUADRANT COMPARISON:  CT abdomen 07/31/2018 FINDINGS: Gallbladder: No gallstones or wall thickening visualized. No sonographic Murphy sign noted by sonographer. Common bile duct: Diameter: 0.5 cm, within normal limits Liver: Small hepatic cysts as shown on CT scan of 07/31/2018. Echogenicity in the upper normal range. Portal vein is patent on color Doppler imaging with normal direction of blood flow towards the liver. Other: None. IMPRESSION: 1. Small hepatic cysts. 2. No biliary dilatation or gallbladder abnormality observed. 3. Hepatic echogenicity is in the upper normal range but not overtly accentuated. Electronically Signed   By: Gaylyn Rong M.D.   On: 05/19/2021 10:42    Assessment & Plan:   Problem List Items Addressed This Visit     Memory problem - Primary    Mild cognitive deficit.   There is concern that there are days when Shelley Bray would repeat herself often asking the same question over and over again.  Otherwise Shelley Bray is highly functional and deficient. Shelley Bray had a stressful day yesterday because one of the residents at their facility died unexpectedly.  Shelley Bray did not sleep well last night. Shelley Bray has been using alprazolam half a tablet at night to help her sleep.  Shelley Bray cannot sleep without it. No family history of Alzheimer's disease. The MMSE score today is 21/30.  I do not think it is valid due to the fact that Shelley Bray is tired and anxious today. We added Namenda. Nerology consultation.      Relevant Orders   Ambulatory referral to Neurology   Essential hypertension    Blood pressure is  well-controlled.  Continue with HCTZ and Norvasc Walking 5000 -7000 steps a day      Insomnia    Shelley Bray has been using alprazolam half a tablet at night to help her sleep.  Shelley Bray cannot sleep without it.  Potential benefits of a long term benzodiazepines  use as well as potential risks  and complications were explained to the patient and were aknowledged.           Meds ordered this encounter  Medications   memantine (NAMENDA) 10 MG tablet    Sig: Take 1 tablet (10 mg total) by mouth 2 (two) times daily.    Dispense:  60 tablet    Refill:  11      Follow-up: Return in about 3 months (around 01/03/2024) for a follow-up visit.  Sonda Primes, MD

## 2023-10-22 ENCOUNTER — Other Ambulatory Visit: Payer: Self-pay | Admitting: Internal Medicine

## 2023-11-10 ENCOUNTER — Other Ambulatory Visit: Payer: Self-pay | Admitting: Internal Medicine

## 2023-12-03 ENCOUNTER — Encounter: Payer: Self-pay | Admitting: Physician Assistant

## 2023-12-03 ENCOUNTER — Other Ambulatory Visit: Payer: Self-pay | Admitting: Internal Medicine

## 2023-12-21 ENCOUNTER — Encounter: Payer: Medicare Other | Admitting: Physician Assistant

## 2024-01-03 ENCOUNTER — Other Ambulatory Visit: Payer: Self-pay | Admitting: Internal Medicine

## 2024-01-24 ENCOUNTER — Other Ambulatory Visit: Payer: Self-pay | Admitting: Internal Medicine

## 2024-03-25 ENCOUNTER — Encounter: Payer: Self-pay | Admitting: Internal Medicine

## 2024-03-25 ENCOUNTER — Ambulatory Visit (INDEPENDENT_AMBULATORY_CARE_PROVIDER_SITE_OTHER): Admitting: Internal Medicine

## 2024-03-25 VITALS — BP 130/85 | HR 88 | Temp 97.9°F | Ht 67.0 in | Wt 170.0 lb

## 2024-03-25 DIAGNOSIS — F419 Anxiety disorder, unspecified: Secondary | ICD-10-CM

## 2024-03-25 DIAGNOSIS — E66811 Obesity, class 1: Secondary | ICD-10-CM | POA: Diagnosis not present

## 2024-03-25 DIAGNOSIS — R5382 Chronic fatigue, unspecified: Secondary | ICD-10-CM | POA: Diagnosis not present

## 2024-03-25 DIAGNOSIS — E785 Hyperlipidemia, unspecified: Secondary | ICD-10-CM

## 2024-03-25 DIAGNOSIS — R202 Paresthesia of skin: Secondary | ICD-10-CM

## 2024-03-25 DIAGNOSIS — E6609 Other obesity due to excess calories: Secondary | ICD-10-CM

## 2024-03-25 DIAGNOSIS — E559 Vitamin D deficiency, unspecified: Secondary | ICD-10-CM

## 2024-03-25 DIAGNOSIS — R413 Other amnesia: Secondary | ICD-10-CM | POA: Diagnosis not present

## 2024-03-25 DIAGNOSIS — M797 Fibromyalgia: Secondary | ICD-10-CM

## 2024-03-25 LAB — CBC WITH DIFFERENTIAL/PLATELET
Basophils Absolute: 0 10*3/uL (ref 0.0–0.1)
Basophils Relative: 0.2 % (ref 0.0–3.0)
Eosinophils Absolute: 0 10*3/uL (ref 0.0–0.7)
Eosinophils Relative: 0.6 % (ref 0.0–5.0)
HCT: 37.2 % (ref 36.0–46.0)
Hemoglobin: 12.4 g/dL (ref 12.0–15.0)
Lymphocytes Relative: 18.4 % (ref 12.0–46.0)
Lymphs Abs: 1.4 10*3/uL (ref 0.7–4.0)
MCHC: 33.4 g/dL (ref 30.0–36.0)
MCV: 87 fl (ref 78.0–100.0)
Monocytes Absolute: 0.4 10*3/uL (ref 0.1–1.0)
Monocytes Relative: 4.8 % (ref 3.0–12.0)
Neutro Abs: 5.9 10*3/uL (ref 1.4–7.7)
Neutrophils Relative %: 76 % (ref 43.0–77.0)
Platelets: 297 10*3/uL (ref 150.0–400.0)
RBC: 4.28 Mil/uL (ref 3.87–5.11)
RDW: 14 % (ref 11.5–15.5)
WBC: 7.7 10*3/uL (ref 4.0–10.5)

## 2024-03-25 LAB — COMPREHENSIVE METABOLIC PANEL WITH GFR
ALT: 14 U/L (ref 0–35)
AST: 19 U/L (ref 0–37)
Albumin: 4.2 g/dL (ref 3.5–5.2)
Alkaline Phosphatase: 104 U/L (ref 39–117)
BUN: 11 mg/dL (ref 6–23)
CO2: 24 meq/L (ref 19–32)
Calcium: 9.7 mg/dL (ref 8.4–10.5)
Chloride: 107 meq/L (ref 96–112)
Creatinine, Ser: 0.76 mg/dL (ref 0.40–1.20)
GFR: 74.42 mL/min
Glucose, Bld: 86 mg/dL (ref 70–99)
Potassium: 3.8 meq/L (ref 3.5–5.1)
Sodium: 144 meq/L (ref 135–145)
Total Bilirubin: 0.5 mg/dL (ref 0.2–1.2)
Total Protein: 7.4 g/dL (ref 6.0–8.3)

## 2024-03-25 LAB — URINALYSIS
Bilirubin Urine: NEGATIVE
Hgb urine dipstick: NEGATIVE
Ketones, ur: NEGATIVE
Leukocytes,Ua: NEGATIVE
Nitrite: NEGATIVE
Specific Gravity, Urine: 1.02 (ref 1.000–1.030)
Total Protein, Urine: NEGATIVE
Urine Glucose: NEGATIVE
Urobilinogen, UA: 0.2 (ref 0.0–1.0)
pH: 6 (ref 5.0–8.0)

## 2024-03-25 LAB — LIPID PANEL
Cholesterol: 232 mg/dL — ABNORMAL HIGH (ref 0–200)
HDL: 61.5 mg/dL
LDL Cholesterol: 138 mg/dL — ABNORMAL HIGH (ref 0–99)
NonHDL: 170.38
Total CHOL/HDL Ratio: 4
Triglycerides: 160 mg/dL — ABNORMAL HIGH (ref 0.0–149.0)
VLDL: 32 mg/dL (ref 0.0–40.0)

## 2024-03-25 LAB — VITAMIN D 25 HYDROXY (VIT D DEFICIENCY, FRACTURES): VITD: 62.39 ng/mL (ref 30.00–100.00)

## 2024-03-25 LAB — VITAMIN B12: Vitamin B-12: 1067 pg/mL — ABNORMAL HIGH (ref 211–911)

## 2024-03-25 LAB — TSH: TSH: 0.96 u[IU]/mL (ref 0.35–5.50)

## 2024-03-25 NOTE — Progress Notes (Signed)
 Subjective:  Patient ID: Shelley Bray, female    DOB: Aug 07, 1946  Age: 78 y.o. MRN: [id number]  CC: Medical Management of Chronic Issues (Check up. Patient concerned with anxiety, notes "most days she gets up on a 10". Would like to know of the other medication/therapy options available to help with the anxiety. Also has fatigue but believes this is just from the fibromyalgia )   HPI Shelley Bray presents for chronic anxiety, notes "most days she gets up on a 10". Would like to know of the other medication/therapy options available to help with the anxiety. Also has fatigue but believes this is just from the fibromyalgia  No issues w/memory Pt lost wt on diet to her goal of 170 lbs  Outpatient Medications Prior to Visit  Medication Sig Dispense Refill   ALPRAZolam  (XANAX ) 0.5 MG tablet TAKE 1 TABLET(0.5 MG) BY MOUTH AT BEDTIME AS NEEDED FOR ANXIETY OR SLEEP 90 tablet 1   amLODipine  (NORVASC ) 2.5 MG tablet TAKE 1 TABLET(2.5 MG) BY MOUTH DAILY 90 tablet 3   Cholecalciferol (VITAMIN D3) 2000 UNITS capsule Take 1 capsule (2,000 Units total) by mouth daily. (Patient taking differently: Take 2,000 Units by mouth 3 (three) times a week.) 100 capsule 3   galantamine  (RAZADYNE ) 12 MG tablet TAKE 1 TABLET BY MOUTH TWICE DAILY 180 tablet 3   hydrochlorothiazide  (MICROZIDE ) 12.5 MG capsule TAKE 1 CAPSULE BY MOUTH EVERY DAY 90 capsule 3   hydrOXYzine  (ATARAX ) 25 MG tablet TAKE 1/2 TO 1 TABLET(12.5 TO 25 MG) BY MOUTH AT BEDTIME AS NEEDED FOR ANXIETY OR INSOMNIA 30 tablet 2   ibuprofen  (ADVIL ,MOTRIN ) 600 MG tablet Take 1 tablet (600 mg total) by mouth every 8 (eight) hours as needed for headache. 60 tablet 1   Lysine  1000 MG TABS Take 1 tablet (1,000 mg total) by mouth daily. 90 tablet 3   memantine  (NAMENDA ) 10 MG tablet Take 1 tablet (10 mg total) by mouth 2 (two) times daily. 60 tablet 11   Multiple Vitamin (MULTI-VITAMIN PO) Take 1 tablet by mouth every morning.      pantoprazole  (PROTONIX ) 40 MG  tablet TAKE 1 TABLET(40 MG) BY MOUTH DAILY 90 tablet 3   Peppermint Oil (IBGARD PO) Take by mouth.     polyethylene glycol powder (GLYCOLAX /MIRALAX ) powder Take 17 g by mouth 2 (two) times daily as needed. 500 g 3   Probiotic Product (ULTRAFLORA IMMUNE HEALTH) 170 MG CAPS TAKE (1) CAPSULE DAILY. 60 capsule PRN   traMADol  (ULTRAM ) 50 MG tablet TAKE 1 TO 2 TABLETS BY MOUTH TWICE DAILY AS NEEDED FOR PAIN 180 tablet 1   No facility-administered medications prior to visit.    ROS: Review of Systems  Constitutional:  Negative for activity change, appetite change, chills, fatigue and unexpected weight change.  HENT:  Negative for congestion, mouth sores and sinus pressure.   Eyes:  Negative for visual disturbance.  Respiratory:  Negative for cough and chest tightness.   Gastrointestinal:  Negative for abdominal pain and nausea.  Genitourinary:  Negative for difficulty urinating, frequency and vaginal pain.  Musculoskeletal:  Negative for back pain and gait problem.  Skin:  Negative for pallor and rash.  Neurological:  Negative for dizziness, tremors, weakness, numbness and headaches.  Psychiatric/Behavioral:  Positive for decreased concentration. Negative for confusion, sleep disturbance and suicidal ideas. The patient is nervous/anxious.     Objective:  BP 130/85   Pulse 88   Temp 97.9 F (36.6 C)   Ht 5\' 7"  (1.702  m)   Wt 170 lb (77.1 kg)   SpO2 95%   BMI 26.63 kg/m   BP Readings from Last 3 Encounters:  03/25/24 130/85  10/03/23 120/82  05/09/23 128/80    Wt Readings from Last 3 Encounters:  03/25/24 170 lb (77.1 kg)  10/03/23 177 lb (80.3 kg)  05/09/23 178 lb 6.4 oz (80.9 kg)    Physical Exam Constitutional:      General: She is not in acute distress.    Appearance: Normal appearance. She is well-developed.  HENT:     Head: Normocephalic.     Right Ear: External ear normal.     Left Ear: External ear normal.     Nose: Nose normal.  Eyes:     General:        Right  eye: No discharge.        Left eye: No discharge.     Conjunctiva/sclera: Conjunctivae normal.     Pupils: Pupils are equal, round, and reactive to light.  Neck:     Thyroid : No thyromegaly.     Vascular: No JVD.     Trachea: No tracheal deviation.  Cardiovascular:     Rate and Rhythm: Normal rate and regular rhythm.     Heart sounds: Normal heart sounds.  Pulmonary:     Effort: No respiratory distress.     Breath sounds: No stridor. No wheezing.  Abdominal:     General: Bowel sounds are normal. There is no distension.     Palpations: Abdomen is soft. There is no mass.     Tenderness: There is no abdominal tenderness. There is no guarding or rebound.  Musculoskeletal:        General: No tenderness.     Cervical back: Normal range of motion and neck supple. No rigidity.  Lymphadenopathy:     Cervical: No cervical adenopathy.  Skin:    Findings: No erythema or rash.  Neurological:     Cranial Nerves: No cranial nerve deficit.     Motor: No abnormal muscle tone.     Coordination: Coordination normal.     Deep Tendon Reflexes: Reflexes normal.  Psychiatric:        Behavior: Behavior normal.        Thought Content: Thought content normal.        Judgment: Judgment normal.     Lab Results  Component Value Date   WBC 5.9 08/16/2022   HGB 12.2 08/16/2022   HCT 37.0 08/16/2022   PLT 267.0 08/16/2022   GLUCOSE 87 05/09/2023   CHOL 212 (H) 09/08/2020   TRIG 92.0 09/08/2020   HDL 66.30 09/08/2020   LDLDIRECT 152.9 10/14/2012   LDLCALC 127 (H) 09/08/2020   ALT 11 05/09/2023   AST 16 05/09/2023   NA 141 05/09/2023   K 3.7 05/09/2023   CL 106 05/09/2023   CREATININE 0.73 05/09/2023   BUN 12 05/09/2023   CO2 28 05/09/2023   TSH 1.05 08/16/2022   INR 1.1 (H) 03/17/2021    US  Abdomen Limited RUQ (LIVER/GB) Result Date: 05/19/2021 CLINICAL DATA:  Elevated alkaline phosphatase.  Chronic fatigue. EXAM: ULTRASOUND ABDOMEN LIMITED RIGHT UPPER QUADRANT COMPARISON:  CT abdomen  07/31/2018 FINDINGS: Gallbladder: No gallstones or wall thickening visualized. No sonographic Murphy sign noted by sonographer. Common bile duct: Diameter: 0.5 cm, within normal limits Liver: Small hepatic cysts as shown on CT scan of 07/31/2018. Echogenicity in the upper normal range. Portal vein is patent on color Doppler imaging with normal direction of  blood flow towards the liver. Other: None. IMPRESSION: 1. Small hepatic cysts. 2. No biliary dilatation or gallbladder abnormality observed. 3. Hepatic echogenicity is in the upper normal range but not overtly accentuated. Electronically Signed   By: Freida Jes M.D.   On: 05/19/2021 10:42    Assessment & Plan:   Problem List Items Addressed This Visit     Anxiety disorder    Xanax  prn - infrequent  Potential benefits of a long term benzodiazepines  use as well as potential risks  and complications were explained to the patient and were aknowledged.      Memory problem   Doing better      Relevant Orders   TSH   Urinalysis   CBC with Differential/Platelet   Lipid panel   Comprehensive metabolic panel with GFR   Vitamin B12   VITAMIN D  25 Hydroxy (Vit-D Deficiency, Fractures)   Chronic fatigue - Primary   CFS discussed Re-start low dose Ritalin   Potential benefits of a long term amphetamines  use as well as potential risks  and complications were explained to the patient and were aknowledged.       Relevant Orders   TSH   Urinalysis   CBC with Differential/Platelet   Lipid panel   Comprehensive metabolic panel with GFR   Vitamin B12   RESOLVED: Class 1 obesity in adult   Pt lost wt on diet to her goal of 170 lbs      Fibromyalgia   Start doing chair yoga Tramadol  prn - rare      Relevant Orders   TSH   Urinalysis   CBC with Differential/Platelet   Lipid panel   Comprehensive metabolic panel with GFR   Vitamin B12   VITAMIN D  25 Hydroxy (Vit-D Deficiency, Fractures)   Other Visit Diagnoses        Paresthesias       Relevant Orders   Vitamin B12     Vitamin D  deficiency       Relevant Orders   VITAMIN D  25 Hydroxy (Vit-D Deficiency, Fractures)     Dyslipidemia       Relevant Orders   Lipid panel         No orders of the defined types were placed in this encounter.     Follow-up: Return in about 6 months (around 09/25/2024) for a follow-up visit.  Anitra Barn, MD

## 2024-03-25 NOTE — Assessment & Plan Note (Signed)
 Doing better.

## 2024-03-25 NOTE — Assessment & Plan Note (Signed)
  Xanax prn - infrequent  Potential benefits of a long term benzodiazepines  use as well as potential risks  and complications were explained to the patient and were aknowledged.

## 2024-03-25 NOTE — Assessment & Plan Note (Signed)
 CFS discussed Re-start low dose Ritalin   Potential benefits of a long term amphetamines  use as well as potential risks  and complications were explained to the patient and were aknowledged.

## 2024-03-25 NOTE — Assessment & Plan Note (Signed)
 Pt lost wt on diet to her goal of 170 lbs

## 2024-03-25 NOTE — Patient Instructions (Signed)
Start doing chair yoga

## 2024-03-25 NOTE — Assessment & Plan Note (Addendum)
 Start doing chair yoga Tramadol  prn - rare

## 2024-03-26 ENCOUNTER — Ambulatory Visit: Payer: Self-pay | Admitting: Internal Medicine

## 2024-04-10 ENCOUNTER — Telehealth: Payer: Self-pay

## 2024-04-10 NOTE — Telephone Encounter (Signed)
 Pharmacy Patient Advocate Encounter   Received notification from CoverMyMeds that prior authorization for Hydroxyzine  25 mg tablets is required/requested.   Insurance verification completed.   The patient is insured through [facility] .   Per test claim: PA required; PA submitted to above mentioned insurance via CoverMyMeds Key/confirmation #/EOC [id number] Status is pending

## 2024-04-11 NOTE — Telephone Encounter (Signed)
 Pharmacy Patient Advocate Encounter  Received notification from [facility] that Prior Authorization for hydrOXYzine  HCl 25MG  tablets has been DENIED.  Full denial letter will be uploaded to the media tab. See denial reason below.   PA #/Case ID/Reference #: [id number]

## 2024-04-15 NOTE — Telephone Encounter (Signed)
 Noted.  It is probably not expensive to purchase anyway.  Shelley Bray can check the price with [email or web address] Thanks,

## 2024-05-13 ENCOUNTER — Ambulatory Visit: Payer: Medicare Other

## 2024-05-27 ENCOUNTER — Ambulatory Visit (INDEPENDENT_AMBULATORY_CARE_PROVIDER_SITE_OTHER)

## 2024-05-27 VITALS — Ht 67.0 in | Wt 170.0 lb

## 2024-05-27 DIAGNOSIS — Z0000 Encounter for general adult medical examination without abnormal findings: Secondary | ICD-10-CM | POA: Diagnosis not present

## 2024-05-27 NOTE — Patient Instructions (Signed)
 Shelley Bray , Thank you for taking time out of your busy schedule to complete your Annual Wellness Visit with me. I enjoyed our conversation and look forward to speaking with you again next year. I, as well as your care team,  appreciate your ongoing commitment to your health goals. Please review the following plan we discussed and let me know if I can assist you in the future. Your Game plan/ To Do List    Follow up Visits: Next Medicare AWV with our clinical staff: 05/28/2025.   Have you seen your provider in the last 6 months (3 months if uncontrolled diabetes)? Yes Next Office Visit with your provider: 09/25/2024.  Clinician Recommendations:  Aim for 30 minutes of exercise or brisk walking, 6-8 glasses of water, and 5 servings of fruits and vegetables each day. You are due for a tetanus vaccine and can get that done at your local pharmacy.  You are also due for a Hep C screening and will have that done during your next office visit.        This is a list of the screening recommended for you and due dates:  Health Maintenance  Topic Date Due   Hepatitis C Screening  Never done   DTaP/Tdap/Td vaccine (1 - Tdap) Never done   DEXA scan (bone density measurement)  Never done   COVID-19 Vaccine (9 - 2024-25 season) 07/08/2023   Flu Shot  06/06/2024   Medicare Annual Wellness Visit  05/27/2025   Pneumococcal Vaccine for age over 15  Completed   Hepatitis B Vaccine  Aged Out   HPV Vaccine  Aged Out   Meningitis B Vaccine  Aged Out   Zoster (Shingles) Vaccine  Discontinued    Advanced directives: (Copy Requested) Please bring a copy of your health care power of attorney and living will to the office to be added to your chart at your convenience. You can mail to [facility] [address] [place], [place] [address] or email to [email or web address] Advance Care Planning is important because it:  [x]  Makes sure you receive the medical care that is consistent with your  values, goals, and preferences  [x]  It provides guidance to your family and loved ones and reduces their decisional burden about whether or not they are making the right decisions based on your wishes.  Follow the link provided in your after visit summary or read over the paperwork we have mailed to you to help you started getting your Advance Directives in place. If you need assistance in completing these, please reach out to us  so that we can help you!  See attachments for Preventive Care and Fall Prevention Tips.

## 2024-05-27 NOTE — Progress Notes (Cosign Needed)
 Subjective:   Shelley Bray is a 78 y.o. who presents for a Medicare Wellness preventive visit.  As a reminder, Annual Wellness Visits don't include a physical exam, and some assessments may be limited, especially if this visit is performed virtually. We may recommend an in-person follow-up visit with your provider if needed.  Visit Complete: Virtual I connected with  Shelley Bray on 05/27/24 by a audio enabled telemedicine application and verified that I am speaking with the correct person using two identifiers.  Patient Location: Home  Provider Location: Home Office  I discussed the limitations of evaluation and management by telemedicine. The patient expressed understanding and agreed to proceed.  Vital Signs: Because this visit was a virtual/telehealth visit, some criteria may be missing or patient reported. Any vitals not documented were not able to be obtained and vitals that have been documented are patient reported.  VideoDeclined- This patient declined Librarian, academic. Therefore the visit was completed with audio only.  Persons Participating in Visit: Patient.  AWV Questionnaire: No: Patient Medicare AWV questionnaire was not completed prior to this visit.  Cardiac Risk Factors include: advanced age (>7men, >19 women);hypertension     Objective:    Today's Vitals   05/27/24 1125  Weight: 170 lb (77.1 kg)  Height: 5' 7 (1.702 m)   Body mass index is 26.63 kg/m.     05/27/2024   11:32 AM 05/09/2023    9:25 AM 05/01/2022    1:20 PM 02/28/2017   10:23 AM 09/09/2016   12:46 AM 02/28/2016    1:57 PM  Advanced Directives  Does Patient Have a Medical Advance Directive? No Yes Yes No  No  Yes   Type of Special educational needs teacher of [place];Living will Living will;Healthcare Power of Attorney     Does patient want to make changes to medical advance directive?   No - Patient declined     Copy of Healthcare Power of Attorney in  Chart?  No - copy requested No - copy requested   No - copy requested   Would patient like information on creating a medical advance directive?    Yes (ED - Information included in AVS)  No - patient declined information       Data saved with a previous flowsheet row definition    Current Medications (verified) Outpatient Encounter Medications as of 05/27/2024  Medication Sig   ALPRAZolam  (XANAX ) 0.5 MG tablet TAKE 1 TABLET(0.5 MG) BY MOUTH AT BEDTIME AS NEEDED FOR ANXIETY OR SLEEP   amLODipine  (NORVASC ) 2.5 MG tablet TAKE 1 TABLET(2.5 MG) BY MOUTH DAILY   Cholecalciferol (VITAMIN D3) 2000 UNITS capsule Take 1 capsule (2,000 Units total) by mouth daily. (Patient taking differently: Take 2,000 Units by mouth 3 (three) times a week.)   galantamine  (RAZADYNE ) 12 MG tablet TAKE 1 TABLET BY MOUTH TWICE DAILY   hydrochlorothiazide  (MICROZIDE ) 12.5 MG capsule TAKE 1 CAPSULE BY MOUTH EVERY DAY   hydrOXYzine  (ATARAX ) 25 MG tablet TAKE 1/2 TO 1 TABLET(12.5 TO 25 MG) BY MOUTH AT BEDTIME AS NEEDED FOR ANXIETY OR INSOMNIA   ibuprofen  (ADVIL ,MOTRIN ) 600 MG tablet Take 1 tablet (600 mg total) by mouth every 8 (eight) hours as needed for headache.   Lysine  1000 MG TABS Take 1 tablet (1,000 mg total) by mouth daily.   memantine  (NAMENDA ) 10 MG tablet Take 1 tablet (10 mg total) by mouth 2 (two) times daily.   Multiple Vitamin (MULTI-VITAMIN PO) Take 1 tablet by mouth  every morning.    pantoprazole  (PROTONIX ) 40 MG tablet TAKE 1 TABLET(40 MG) BY MOUTH DAILY   Peppermint Oil (IBGARD PO) Take by mouth.   polyethylene glycol powder (GLYCOLAX /MIRALAX ) powder Take 17 g by mouth 2 (two) times daily as needed.   Probiotic Product (ULTRAFLORA IMMUNE HEALTH) 170 MG CAPS TAKE (1) CAPSULE DAILY.   traMADol  (ULTRAM ) 50 MG tablet TAKE 1 TO 2 TABLETS BY MOUTH TWICE DAILY AS NEEDED FOR PAIN   No facility-administered encounter medications on file as of 05/27/2024.    Allergies (verified) Crestor [rosuvastatin calcium],  Other, and Shellfish allergy   History: Past Medical History:  Diagnosis Date   ANXIETY    GERD    HYPERLIPIDEMIA    HYPERTENSION    Memory loss    after son died in  June 19, 2001   SHELLFISH ALLERGY    Past Surgical History:  Procedure Laterality Date   TUBAL LIGATION     Family History  Problem Relation Age of Onset   Heart attack Father 2   Breast cancer Sister    Cervical cancer Sister    Ovarian cancer Sister    Heart attack Brother 25   Social History   Socioeconomic History   Marital status: Married    Spouse name: Tanda   Number of children: 2   Years of education: Not on file   Highest education level: Bachelor's degree (e.g., BA, AB, BS)  Occupational History   Occupation: Retired Technical sales engineer  Tobacco Use   Smoking status: Never   Smokeless tobacco: Never  Vaping Use   Vaping status: Never Used  Substance and Sexual Activity   Alcohol use: No    Alcohol/week: 0.0 standard drinks of alcohol   Drug use: No   Sexual activity: Not on file  Other Topics Concern   Not on file  Social History Narrative   Lives with husband   Social Drivers of Health   Financial Resource Strain: Low Risk  (05/27/2024)   Overall Financial Resource Strain (CARDIA)    Difficulty of Paying Living Expenses: Not hard at all  Food Insecurity: No Food Insecurity (05/27/2024)   Hunger Vital Sign    Worried About Running Out of Food in the Last Year: Never true    Ran Out of Food in the Last Year: Never true  Transportation Needs: No Transportation Needs (05/27/2024)   PRAPARE - Administrator, Civil Service (Medical): No    Lack of Transportation (Non-Medical): No  Physical Activity: Inactive (05/27/2024)   Exercise Vital Sign    Days of Exercise per Week: 0 days    Minutes of Exercise per Session: 0 min  Stress: No Stress Concern Present (05/27/2024)   [employer] of Occupational Health - Occupational Stress Questionnaire    Feeling of Stress: Only a little   Recent Concern: Stress - Stress Concern Present (03/24/2024)   [employer] of Occupational Health - Occupational Stress Questionnaire    Feeling of Stress : To some extent  Social Connections: Moderately Integrated (05/27/2024)   Social Connection and Isolation Panel    Frequency of Communication with Friends and Family: More than three times a week    Frequency of Social Gatherings with Friends and Family: Once a week    Attends Religious Services: 1 to 4 times per year    Active Member of [employer] or Organizations: No    Attends Banker Meetings: Never    Marital Status: Married    Tobacco Counseling  Counseling given: Not Answered    Clinical Intake:  Pre-visit preparation completed: Yes  Pain : No/denies pain     BMI - recorded: 26.63 Nutritional Status: BMI 25 -29 Overweight Nutritional Risks: Nausea/ vomitting/ diarrhea Diabetes: No  No results found for: HGBA1C   How often do you need to have someone help you when you read instructions, pamphlets, or other written materials from your doctor or pharmacy?: 1 - Never  Interpreter Needed?: No  Information entered by :: Meigan Pates, RMA   Activities of Daily Living     05/27/2024   11:26 AM  In your present state of health, do you have any difficulty performing the following activities:  Hearing? 0  Vision? 0  Difficulty concentrating or making decisions? 0  Walking or climbing stairs? 0  Dressing or bathing? 0  Doing errands, shopping? 0  Preparing Food and eating ? N  Using the Toilet? N  In the past six months, have you accidently leaked urine? N  Do you have problems with loss of bowel control? N  Managing your Medications? N  Managing your Finances? N  Housekeeping or managing your Housekeeping? N    Patient Care Team: Plotnikov, Karlynn GAILS, MD as PCP - General (Internal Medicine) Kristie Lamprey, MD as Attending Physician (Gastroenterology) Verlin Lonni BIRCH, MD as Attending  Physician (Cardiology) Leva Rush, MD as Consulting Physician (Obstetrics and Gynecology) [facility] [employer] as Consulting Physician (Optometry)  I have updated your Care Teams any recent Medical Services you may have received from other providers in the past year.     Assessment:   This is a routine wellness examination for Liara.  Hearing/Vision screen Hearing Screening - Comments:: Denies hearing difficulties   Vision Screening - Comments:: Wears eyeglasses/   Goals Addressed             This Visit's Progress    Lose weight.  My weight goal is to be 170 lbs.   On track    Continue to exercise, eat healthy, and enjoy life       Depression Screen     05/27/2024   11:35 AM 03/25/2024    9:34 AM 10/03/2023   10:43 AM 05/09/2023    9:27 AM 03/15/2023   10:37 AM 02/07/2023   10:14 AM 11/16/2022   10:24 AM  PHQ 2/9 Scores  PHQ - 2 Score 0 1 0 0 0 0 0  PHQ- 9 Score 0 6  0       Fall Risk     05/27/2024   11:32 AM 03/25/2024    9:34 AM 10/03/2023   10:43 AM 05/09/2023    9:27 AM 05/07/2023    6:23 AM  Fall Risk   Falls in the past year? 0 0 0 0 0  Number falls in past yr: 0 0 0 0   Injury with Fall? 0 0 0 0   Risk for fall due to :  No Fall Risks No Fall Risks No Fall Risks   Follow up Falls evaluation completed;Falls prevention discussed Falls evaluation completed Falls evaluation completed Falls prevention discussed     MEDICARE RISK AT HOME:  Medicare Risk at Home Any stairs in or around the home?: Yes If so, are there any without handrails?: No Home free of loose throw rugs in walkways, pet beds, electrical cords, etc?: Yes Adequate lighting in your home to reduce risk of falls?: Yes Life alert?: No Use of a cane, walker or w/c?: No Grab  bars in the bathroom?: Yes Shower chair or bench in shower?: Yes Elevated toilet seat or a handicapped toilet?: Yes  TIMED UP AND GO:  Was the test performed?  No  Cognitive Function: Declined/Normal: No cognitive  concerns noted by patient or family. Patient alert, oriented, able to answer questions appropriately and recall recent events. No signs of memory loss or confusion.    02/28/2016    1:59 PM  MMSE - Mini Mental State Exam  Not completed: --        05/09/2023    9:28 AM 05/01/2022    1:24 PM  6CIT Screen  What Year? 0 points 0 points  What month? 0 points 0 points  What time? 0 points 0 points  Count back from 20 0 points 0 points  Months in reverse 0 points 0 points  Repeat phrase 0 points 0 points  Total Score 0 points 0 points    Immunizations Immunization History  Administered Date(s) Administered   Fluad Quad(high Dose 65+) 07/29/2019   Influenza, High Dose Seasonal PF 07/31/2014, 08/02/2015, 07/31/2017, 08/17/2020, 08/06/2022   Influenza, Seasonal, Injecte, Preservative Fre 07/29/2019   Influenza,inj,Quad PF,6+ Mos 07/21/2016   Influenza-Unspecified 08/13/2013, 07/18/2018, 08/12/2021   Moderna SARS-COV2 Booster Vaccination 09/14/2020, 05/24/2021   Moderna Sars-Covid-2 Vaccination 11/14/2019, 12/12/2019, 09/14/2020, 05/24/2021   PNEUMOCOCCAL CONJUGATE-20 05/09/2023   Pfizer Covid-19 Vaccine Bivalent Booster 48yrs & up 08/12/2021, 08/01/2022   Pneumococcal Conjugate-13 02/28/2016   Zoster, Live 01/17/2011    Screening Tests Health Maintenance  Topic Date Due   Hepatitis C Screening  Never done   DTaP/Tdap/Td (1 - Tdap) Never done   DEXA SCAN  Never done   COVID-19 Vaccine (9 - 2024-25 season) 07/08/2023   INFLUENZA VACCINE  06/06/2024   Medicare Annual Wellness (AWV)  05/27/2025   Pneumococcal Vaccine: 50+ Years  Completed   Hepatitis B Vaccines  Aged Out   HPV VACCINES  Aged Out   Meningococcal B Vaccine  Aged Out   Zoster Vaccines- Shingrix  Discontinued    Health Maintenance  Health Maintenance Due  Topic Date Due   Hepatitis C Screening  Never done   DTaP/Tdap/Td (1 - Tdap) Never done   DEXA SCAN  Never done   COVID-19 Vaccine (9 - 2024-25 season)  07/08/2023   Health Maintenance Items Addressed: Labs Ordered: Hep C screening due, See Nurse Notes at the end of this note  Additional Screening:  Vision Screening: Recommended annual ophthalmology exams for early detection of glaucoma and other disorders of the eye. Would you like a referral to an eye doctor? No    Dental Screening: Recommended annual dental exams for proper oral hygiene  Community Resource Referral / Chronic Care Management: CRR required this visit?  No   CCM required this visit?  No   Plan:    I have personally reviewed and noted the following in the patient's chart:   Medical and social history Use of alcohol, tobacco or illicit drugs  Current medications and supplements including opioid prescriptions. Patient is not currently taking opioid prescriptions. Functional ability and status Nutritional status Physical activity Advanced directives List of other physicians Hospitalizations, surgeries, and ER visits in previous 12 months Vitals Screenings to include cognitive, depression, and falls Referrals and appointments  In addition, I have reviewed and discussed with patient certain preventive protocols, quality metrics, and best practice recommendations. A written personalized care plan for preventive services as well as general preventive health recommendations were provided to patient.   Envi Eagleson  L Kayela Humphres, CMA   05/27/2024   After Visit Summary: (MyChart) Due to this being a telephonic visit, the after visit summary with patients personalized plan was offered to patient via MyChart   Notes: Patient is due for a DEXA, however she declines for this year.  She is due for a tDap and a Hep C screening and can get that lab done during her next office visit.  Patient had no other concerns to address today.  Medical screening examination/treatment/procedure(s) were performed by non-physician practitioner and as supervising physician I was immediately available  for consultation/collaboration.  I agree with above. Karlynn Noel, MD

## 2024-06-20 ENCOUNTER — Other Ambulatory Visit: Payer: Self-pay | Admitting: Internal Medicine

## 2024-07-18 ENCOUNTER — Other Ambulatory Visit: Payer: Self-pay | Admitting: Internal Medicine

## 2024-08-16 ENCOUNTER — Other Ambulatory Visit: Payer: Self-pay | Admitting: Internal Medicine

## 2024-08-19 ENCOUNTER — Ambulatory Visit: Payer: Self-pay

## 2024-08-19 NOTE — Telephone Encounter (Signed)
 FYI Only or Action Required?: FYI only for provider.  Patient was last seen in primary care on 03/25/2024 by Plotnikov, Karlynn GAILS, MD.  Called Nurse Triage reporting Follow-up.   Triage Disposition: See PCP When Office is Open (Within 3 Days)  Patient/caregiver understands and will follow disposition?: Unsure        Copied from CRM [id number]. Topic: Clinical - Red Word Triage >> Aug 19, 2024  9:51 AM Mia F wrote: Red Word that prompted transfer to Nurse Triage: Pt daughter Amma called to get pt an appt due to lightheadedness. Amma states he mother has been experiencing lightheadedness on and off for some time. She says her mother has also been low on energy as well. Has been going on for some time. Reason for Disposition  Caller requesting an appointment, triage offered and declined    Daughter, Amma reports that she logged into her personal MyChart, d/t not having pt's Mychart access to secure follow up appt for [facility]. Amma is requesting triager to cancel Amma's appt and schedule Gaetana in appt slot. Triager inquired multiple times if Amma has any new or worsening concerns for pt, and she denied.  Additional Information  Commented on: Answer Assessment    PAS reported that Amma stated pt was lightheaded so transferred to NT. Amma reports she is unsure why she was transferred to NT.  Upon further investigation, Amma reports pt was feeling lightheaded, but is OK now. Amma endorses that she would like to request follow up appt for pt and denies any new or worsening sx.  Answer Assessment - Initial Assessment Questions 1. REASON FOR CALL or QUESTION: What is your reason for calling today? or How can I best     Daughter reports wants to schedule appt for pt to make sure she's OK. Denies any new or worsening sx 2. CALLER: Document the source of call. (e.g., laboratory staff, caregiver or patient).     daughter  Protocols used: Dizziness - Lightheadedness-A-AH, PCP Call - No  Triage-A-AH

## 2024-08-20 ENCOUNTER — Ambulatory Visit (INDEPENDENT_AMBULATORY_CARE_PROVIDER_SITE_OTHER): Admitting: Internal Medicine

## 2024-08-20 ENCOUNTER — Encounter: Payer: Self-pay | Admitting: Internal Medicine

## 2024-08-20 VITALS — BP 138/76 | HR 89 | Temp 97.9°F | Ht 67.0 in | Wt 172.0 lb

## 2024-08-20 DIAGNOSIS — I10 Essential (primary) hypertension: Secondary | ICD-10-CM | POA: Diagnosis not present

## 2024-08-20 DIAGNOSIS — R413 Other amnesia: Secondary | ICD-10-CM

## 2024-08-20 DIAGNOSIS — F419 Anxiety disorder, unspecified: Secondary | ICD-10-CM | POA: Diagnosis not present

## 2024-08-20 DIAGNOSIS — K219 Gastro-esophageal reflux disease without esophagitis: Secondary | ICD-10-CM

## 2024-08-20 MED ORDER — ALPRAZOLAM 0.5 MG PO TABS: ORAL_TABLET | ORAL | 0 refills | Status: DC

## 2024-08-20 MED ORDER — MEMANTINE HCL 10 MG PO TABS: 10.0000 mg | ORAL_TABLET | Freq: Two times a day (BID) | ORAL | 11 refills | Status: AC

## 2024-08-20 NOTE — Progress Notes (Signed)
 Subjective:  Patient ID: Shelley Bray, female    DOB: August 07, 1946  Age: 78 y.o. MRN: [id number]  CC: No chief complaint on file.   HPI Shelley Bray presents for memory issues.  Memory issues are minor.  [name] does admit to asking the same question to 3 times according to her family members.  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  She is complaining of anxiety and occasional insomnia C/o burping a lot, some belching x 6 wks Not taking Namenda  that what was prescribed 1 year ago-Delorise never picked it up.  She has not been taking alprazolam  and tramadol  for several months.  Follow-up on hypertension   Outpatient Medications Prior to Visit  Medication Sig Dispense Refill   amLODipine  (NORVASC ) 2.5 MG tablet TAKE 1 TABLET(2.5 MG) BY MOUTH DAILY 90 tablet 3   galantamine  (RAZADYNE ) 12 MG tablet TAKE 1 TABLET BY MOUTH TWICE DAILY 180 tablet 3   hydrochlorothiazide  (MICROZIDE ) 12.5 MG capsule TAKE 1 CAPSULE BY MOUTH EVERY DAY 90 capsule 3   hydrOXYzine  (ATARAX ) 25 MG tablet TAKE 1/2 TO 1 TABLET(12.5 TO 25 MG) BY MOUTH AT BEDTIME AS NEEDED FOR ANXIETY OR INSOMNIA 30 tablet 2   ibuprofen  (ADVIL ,MOTRIN ) 600 MG tablet Take 1 tablet (600 mg total) by mouth every 8 (eight) hours as needed for headache. 60 tablet 1   Lysine  1000 MG TABS Take 1 tablet (1,000 mg total) by mouth daily. 90 tablet 3   Multiple Vitamin (MULTI-VITAMIN PO) Take 1 tablet by mouth every morning.      pantoprazole  (PROTONIX ) 40 MG tablet TAKE 1 TABLET(40 MG) BY MOUTH DAILY 90 tablet 3   Peppermint Oil (IBGARD PO) Take by mouth.     polyethylene glycol powder (GLYCOLAX /MIRALAX ) powder Take 17 g by mouth 2 (two) times daily as needed. 500 g 3   Probiotic Product (ULTRAFLORA IMMUNE HEALTH) 170 MG CAPS TAKE (1) CAPSULE DAILY. 60 capsule PRN   traMADol  (ULTRAM ) 50 MG tablet TAKE 1 TO 2 TABLETS BY MOUTH TWICE DAILY AS NEEDED FOR PAIN 180 tablet 1   ALPRAZolam  (XANAX ) 0.5 MG tablet TAKE 1 TABLET(0.5 MG)  BY MOUTH AT BEDTIME AS NEEDED FOR ANXIETY OR SLEEP 90 tablet 1   memantine  (NAMENDA ) 10 MG tablet Take 1 tablet (10 mg total) by mouth 2 (two) times daily. 60 tablet 11   Cholecalciferol (VITAMIN D3) 2000 UNITS capsule Take 1 capsule (2,000 Units total) by mouth daily. (Patient not taking: Reported on 08/20/2024) 100 capsule 3   No facility-administered medications prior to visit.    ROS: Review of Systems  Constitutional:  Negative for activity change, appetite change, chills, fatigue and unexpected weight change.  HENT:  Negative for congestion, mouth sores and sinus pressure.   Eyes:  Negative for visual disturbance.  Respiratory:  Negative for cough and chest tightness.   Gastrointestinal:  Positive for abdominal distention. Negative for abdominal pain and nausea.  Genitourinary:  Negative for difficulty urinating, frequency and vaginal pain.  Musculoskeletal:  Positive for arthralgias. Negative for back pain and gait problem.  Skin:  Negative for pallor and rash.  Neurological:  Negative for dizziness, tremors, weakness, numbness and headaches.  Psychiatric/Behavioral:  Positive for decreased concentration and sleep disturbance. Negative for confusion and suicidal ideas. The patient is nervous/anxious.     Objective:  BP 138/76   Pulse 89   Temp 97.9 F (36.6 C) (Temporal)   Ht 5' 7 (1.702 m)   Wt 172 lb (78 kg)  SpO2 97%   BMI 26.94 kg/m   BP Readings from Last 3 Encounters:  08/20/24 138/76  03/25/24 130/85  10/03/23 120/82    Wt Readings from Last 3 Encounters:  08/20/24 172 lb (78 kg)  05/27/24 170 lb (77.1 kg)  03/25/24 170 lb (77.1 kg)    Physical Exam Constitutional:      General: She is not in acute distress.    Appearance: Normal appearance. She is well-developed. She is obese.  HENT:     Head: Normocephalic.     Right Ear: External ear normal.     Left Ear: External ear normal.     Nose: Nose normal.  Eyes:     General:        Right eye: No  discharge.        Left eye: No discharge.     Conjunctiva/sclera: Conjunctivae normal.     Pupils: Pupils are equal, round, and reactive to light.  Neck:     Thyroid : No thyromegaly.     Vascular: No JVD.     Trachea: No tracheal deviation.  Cardiovascular:     Rate and Rhythm: Normal rate and regular rhythm.     Heart sounds: Normal heart sounds.  Pulmonary:     Effort: No respiratory distress.     Breath sounds: No stridor. No wheezing.  Abdominal:     General: Bowel sounds are normal. There is no distension.     Palpations: Abdomen is soft. There is no mass.     Tenderness: There is no abdominal tenderness. There is no guarding or rebound.  Musculoskeletal:        General: No tenderness.     Cervical back: Normal range of motion and neck supple. No rigidity.     Right lower leg: No edema.     Left lower leg: No edema.  Lymphadenopathy:     Cervical: No cervical adenopathy.  Skin:    Findings: No erythema or rash.  Neurological:     Mental Status: She is oriented to person, place, and time. Mental status is at baseline.     Cranial Nerves: No cranial nerve deficit.     Motor: No abnormal muscle tone.     Coordination: Coordination normal.     Deep Tendon Reflexes: Reflexes normal.  Psychiatric:        Behavior: Behavior normal.        Thought Content: Thought content normal.        Judgment: Judgment normal.     Lab Results  Component Value Date   WBC 7.7 03/25/2024   HGB 12.4 03/25/2024   HCT 37.2 03/25/2024   PLT 297.0 03/25/2024   GLUCOSE 86 03/25/2024   CHOL 232 (H) 03/25/2024   TRIG 160.0 (H) 03/25/2024   HDL 61.50 03/25/2024   LDLDIRECT 152.9 10/14/2012   LDLCALC 138 (H) 03/25/2024   ALT 14 03/25/2024   AST 19 03/25/2024   NA 144 03/25/2024   K 3.8 03/25/2024   CL 107 03/25/2024   CREATININE 0.76 03/25/2024   BUN 11 03/25/2024   CO2 24 03/25/2024   TSH 0.96 03/25/2024   INR 1.1 (H) 03/17/2021    US  Abdomen Limited RUQ (LIVER/GB) Result Date:  05/19/2021 CLINICAL DATA:  Elevated alkaline phosphatase.  Chronic fatigue. EXAM: ULTRASOUND ABDOMEN LIMITED RIGHT UPPER QUADRANT COMPARISON:  CT abdomen 07/31/2018 FINDINGS: Gallbladder: No gallstones or wall thickening visualized. No sonographic Murphy sign noted by sonographer. Common bile duct: Diameter: 0.5 cm, within normal limits  Liver: Small hepatic cysts as shown on CT scan of 07/31/2018. Echogenicity in the upper normal range. Portal vein is patent on color Doppler imaging with normal direction of blood flow towards the liver. Other: None. IMPRESSION: 1. Small hepatic cysts. 2. No biliary dilatation or gallbladder abnormality observed. 3. Hepatic echogenicity is in the upper normal range but not overtly accentuated. Electronically Signed   By: Ryan Salvage M.D.   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  Trivia does have severe anxiety at times and occasional insomnia.  I think it is okay to take alprazolam  at low-dose infrequently      Relevant Medications   ALPRAZolam  (XANAX ) 0.5 MG tablet   Essential hypertension   Blood pressure is well-controlled.  Continue with HCTZ and Norvasc  Walking 5000 -7000 steps a day      GERD (gastroesophageal reflux disease)   On Protonix .  Worse GI referral - Dr Kristie      Relevant Orders   Ambulatory referral to Gastroenterology   Memory problem - Primary   Memory issues are minor.  Lorrinda does admit to asking the same question to 3 times according to her family members.  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  She is complaining of anxiety and occasional insomnia. Not taking Namenda  that what was prescribed 1 year ago-Bryttney never picked it up.  She has not been taking alprazolam  and tramadol  for several months.  Re-start Namenda  Neuro ref - Camie Sevin PA      Relevant Orders   Ambulatory  referral to Neurology      Meds ordered this encounter  Medications   memantine  (NAMENDA ) 10 MG tablet    Sig: Take 1 tablet (10 mg total) by mouth 2 (two) times daily.    Dispense:  60 tablet    Refill:  11   ALPRAZolam  (XANAX ) 0.5 MG tablet    Sig: TAKE 1 TABLET(0.5 MG) BY MOUTH AT BEDTIME AS NEEDED FOR ANXIETY OR SLEEP    Dispense:  90 tablet    Refill:  0      Follow-up: Return in about 3 months (around 11/20/2024) for a follow-up visit.  Marolyn Noel, MD

## 2024-08-20 NOTE — Assessment & Plan Note (Signed)
 Blood pressure is well-controlled.   Continue with HCTZ and NorvascWalking 5000 -7000 steps a day

## 2024-08-20 NOTE — Assessment & Plan Note (Addendum)
 On Protonix .  Worse GI referral - Dr Kristie

## 2024-08-20 NOTE — Assessment & Plan Note (Signed)
 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  Shelley Bray does have severe anxiety at times and occasional insomnia.  I think it is okay to take alprazolam  at low-dose infrequently

## 2024-08-20 NOTE — Assessment & Plan Note (Addendum)
 Memory issues are minor.  Shelley Bray does admit to asking the same question to 3 times according to her family members.  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  She is complaining of anxiety and occasional insomnia. Not taking Namenda  that what was prescribed 1 year ago-Ara never picked it up.  She has not been taking alprazolam  and tramadol  for several months.  Re-start Namenda  Neuro ref - Camie Sevin PA

## 2024-08-26 ENCOUNTER — Ambulatory Visit: Admitting: Physician Assistant

## 2024-08-26 ENCOUNTER — Ambulatory Visit

## 2024-09-23 LAB — COLOGUARD

## 2024-09-25 ENCOUNTER — Ambulatory Visit: Admitting: Internal Medicine

## 2024-10-02 LAB — COLOGUARD

## 2024-10-13 LAB — COLOGUARD

## 2024-10-20 ENCOUNTER — Other Ambulatory Visit: Payer: Self-pay | Admitting: Internal Medicine

## 2024-11-21 LAB — LAB REPORT - SCANNED: Cologuard: NEGATIVE

## 2024-11-24 ENCOUNTER — Other Ambulatory Visit: Payer: Self-pay | Admitting: Internal Medicine

## 2024-11-27 LAB — COLOGUARD: COLOGUARD: NEGATIVE

## 2024-12-03 ENCOUNTER — Encounter: Payer: Self-pay | Admitting: Internal Medicine

## 2024-12-05 ENCOUNTER — Other Ambulatory Visit: Payer: Self-pay | Admitting: Internal Medicine

## 2024-12-05 MED ORDER — HYOSCYAMINE SULFATE 0.125 MG PO TABS: 0.1250 mg | ORAL_TABLET | ORAL | 3 refills | Status: AC | PRN

## 2025-01-21 ENCOUNTER — Ambulatory Visit: Admitting: Internal Medicine

## 2025-05-28 ENCOUNTER — Ambulatory Visit
